# Patient Record
Sex: Male | Born: 1960 | Hispanic: Yes | Marital: Single | Smoking: Former smoker
Health system: Southern US, Community
[De-identification: ages and names within clinical notes are randomized; demographics above are authoritative.]

## PROBLEM LIST (undated history)

## (undated) DIAGNOSIS — J45909 Unspecified asthma, uncomplicated: Secondary | ICD-10-CM

---

## 2020-06-05 ENCOUNTER — Emergency Department (HOSPITAL_COMMUNITY): Payer: Medicare (Managed Care)

## 2020-06-05 ENCOUNTER — Other Ambulatory Visit: Payer: Self-pay

## 2020-06-05 ENCOUNTER — Encounter (HOSPITAL_COMMUNITY): Payer: Self-pay

## 2020-06-05 ENCOUNTER — Inpatient Hospital Stay (HOSPITAL_COMMUNITY)
Admission: EM | Admit: 2020-06-05 | Discharge: 2020-06-09 | DRG: 872 | Disposition: A | Discharge status: Home / Self Care | Payer: Medicare (Managed Care) | Attending: Internal Medicine | Admitting: Internal Medicine

## 2020-06-05 DIAGNOSIS — K3184 Gastroparesis: Secondary | ICD-10-CM | POA: Diagnosis present

## 2020-06-05 DIAGNOSIS — A0472 Enterocolitis due to Clostridium difficile, not specified as recurrent: Secondary | ICD-10-CM | POA: Diagnosis present

## 2020-06-05 DIAGNOSIS — D649 Anemia, unspecified: Secondary | ICD-10-CM | POA: Diagnosis present

## 2020-06-05 DIAGNOSIS — J4520 Mild intermittent asthma, uncomplicated: Secondary | ICD-10-CM

## 2020-06-05 DIAGNOSIS — I10 Essential (primary) hypertension: Secondary | ICD-10-CM | POA: Diagnosis not present

## 2020-06-05 DIAGNOSIS — K760 Fatty (change of) liver, not elsewhere classified: Secondary | ICD-10-CM | POA: Diagnosis present

## 2020-06-05 DIAGNOSIS — F1011 Alcohol abuse, in remission: Secondary | ICD-10-CM

## 2020-06-05 DIAGNOSIS — Z20822 Contact with and (suspected) exposure to covid-19: Secondary | ICD-10-CM | POA: Diagnosis present

## 2020-06-05 DIAGNOSIS — R112 Nausea with vomiting, unspecified: Secondary | ICD-10-CM | POA: Diagnosis present

## 2020-06-05 DIAGNOSIS — R000 Tachycardia, unspecified: Secondary | ICD-10-CM | POA: Diagnosis present

## 2020-06-05 DIAGNOSIS — K2100 Gastro-esophageal reflux disease with esophagitis, without bleeding: Secondary | ICD-10-CM | POA: Diagnosis present

## 2020-06-05 DIAGNOSIS — Z87891 Personal history of nicotine dependence: Secondary | ICD-10-CM

## 2020-06-05 DIAGNOSIS — A414 Sepsis due to anaerobes: Secondary | ICD-10-CM | POA: Diagnosis not present

## 2020-06-05 DIAGNOSIS — A419 Sepsis, unspecified organism: Secondary | ICD-10-CM | POA: Diagnosis not present

## 2020-06-05 DIAGNOSIS — K2270 Barrett's esophagus without dysplasia: Secondary | ICD-10-CM | POA: Diagnosis present

## 2020-06-05 DIAGNOSIS — R0682 Tachypnea, not elsewhere classified: Secondary | ICD-10-CM

## 2020-06-05 DIAGNOSIS — F1020 Alcohol dependence, uncomplicated: Secondary | ICD-10-CM | POA: Diagnosis present

## 2020-06-05 DIAGNOSIS — R3911 Hesitancy of micturition: Secondary | ICD-10-CM | POA: Diagnosis present

## 2020-06-05 DIAGNOSIS — J9811 Atelectasis: Secondary | ICD-10-CM | POA: Diagnosis present

## 2020-06-05 DIAGNOSIS — R109 Unspecified abdominal pain: Secondary | ICD-10-CM

## 2020-06-05 DIAGNOSIS — Z79899 Other long term (current) drug therapy: Secondary | ICD-10-CM

## 2020-06-05 DIAGNOSIS — E876 Hypokalemia: Secondary | ICD-10-CM | POA: Diagnosis present

## 2020-06-05 DIAGNOSIS — N390 Urinary tract infection, site not specified: Secondary | ICD-10-CM

## 2020-06-05 DIAGNOSIS — E861 Hypovolemia: Secondary | ICD-10-CM | POA: Diagnosis present

## 2020-06-05 DIAGNOSIS — I119 Hypertensive heart disease without heart failure: Secondary | ICD-10-CM | POA: Diagnosis present

## 2020-06-05 DIAGNOSIS — Z6823 Body mass index (BMI) 23.0-23.9, adult: Secondary | ICD-10-CM

## 2020-06-05 DIAGNOSIS — N179 Acute kidney failure, unspecified: Secondary | ICD-10-CM | POA: Diagnosis present

## 2020-06-05 DIAGNOSIS — R630 Anorexia: Secondary | ICD-10-CM | POA: Diagnosis present

## 2020-06-05 DIAGNOSIS — Z880 Allergy status to penicillin: Secondary | ICD-10-CM

## 2020-06-05 DIAGNOSIS — K5732 Diverticulitis of large intestine without perforation or abscess without bleeding: Secondary | ICD-10-CM | POA: Diagnosis present

## 2020-06-05 HISTORY — DX: Mild intermittent asthma, uncomplicated: J45.20

## 2020-06-05 HISTORY — DX: Alcohol abuse, in remission: F10.11

## 2020-06-05 HISTORY — DX: Essential (primary) hypertension: I10

## 2020-06-05 HISTORY — DX: Gastro-esophageal reflux disease with esophagitis, without bleeding: K21.00

## 2020-06-05 HISTORY — DX: Unspecified asthma, uncomplicated: J45.909

## 2020-06-05 LAB — URINALYSIS, ROUTINE W REFLEX MICROSCOPIC
Bilirubin Urine: NEGATIVE
Glucose, UA: NEGATIVE mg/dL
Ketones, ur: NEGATIVE mg/dL
Nitrite: POSITIVE — AB
Protein, ur: 100 mg/dL — AB
Specific Gravity, Urine: 1.015 (ref 1.005–1.030)
WBC, UA: >50 WBC/hpf — ABNORMAL HIGH (ref 0–5)
pH: 7 (ref 5.0–8.0)

## 2020-06-05 LAB — CBC WITH DIFFERENTIAL/PLATELET
Abs Immature Granulocytes: 0.12 10*3/uL — ABNORMAL HIGH (ref 0.00–0.07)
Basophils Absolute: 0.1 10*3/uL (ref 0.0–0.1)
Basophils Relative: 1 %
Eosinophils Absolute: 0 10*3/uL (ref 0.0–0.5)
Eosinophils Relative: 0 %
HCT: 42 % (ref 39.0–52.0)
Hemoglobin: 13.7 g/dL (ref 13.0–17.0)
Immature Granulocytes: 1 %
Lymphocytes Relative: 12 %
Lymphs Abs: 1.2 10*3/uL (ref 0.7–4.0)
MCH: 30.2 pg (ref 26.0–34.0)
MCHC: 32.6 g/dL (ref 30.0–36.0)
MCV: 92.7 fL (ref 80.0–100.0)
Monocytes Absolute: 1.2 10*3/uL — ABNORMAL HIGH (ref 0.1–1.0)
Monocytes Relative: 12 %
Neutro Abs: 7.6 10*3/uL (ref 1.7–7.7)
Neutrophils Relative %: 74 %
Platelets: 454 10*3/uL — ABNORMAL HIGH (ref 150–400)
RBC: 4.53 MIL/uL (ref 4.22–5.81)
RDW: 14.8 % (ref 11.5–15.5)
WBC: 10.3 10*3/uL (ref 4.0–10.5)
nRBC: 0 % (ref 0.0–0.2)

## 2020-06-05 LAB — COMPREHENSIVE METABOLIC PANEL
ALT: 18 U/L (ref 0–44)
AST: 20 U/L (ref 15–41)
Albumin: 3.2 g/dL — ABNORMAL LOW (ref 3.5–5.0)
Alkaline Phosphatase: 104 U/L (ref 38–126)
Anion gap: 17 — ABNORMAL HIGH (ref 5–15)
BUN: 6 mg/dL (ref 6–20)
CO2: 25 mmol/L (ref 22–32)
Calcium: 8.4 mg/dL — ABNORMAL LOW (ref 8.9–10.3)
Chloride: 96 mmol/L — ABNORMAL LOW (ref 98–111)
Creatinine, Ser: 1.24 mg/dL (ref 0.61–1.24)
GFR, Estimated: >60 mL/min (ref >60)
Glucose, Bld: 114 mg/dL — ABNORMAL HIGH (ref 70–99)
Potassium: 2.7 mmol/L — CL (ref 3.5–5.1)
Sodium: 138 mmol/L (ref 135–145)
Total Bilirubin: 1 mg/dL (ref 0.3–1.2)
Total Protein: 7.7 g/dL (ref 6.5–8.1)

## 2020-06-05 LAB — TROPONIN I (HIGH SENSITIVITY)
Troponin I (High Sensitivity): 9 ng/L (ref <18)
Troponin I (High Sensitivity): 9 ng/L (ref <18)

## 2020-06-05 LAB — HEMOGLOBIN A1C
Hgb A1c MFr Bld: 5.4 % (ref 4.8–5.6)
Mean Plasma Glucose: 108.28 mg/dL

## 2020-06-05 LAB — C-REACTIVE PROTEIN: CRP: 1.4 mg/dL — ABNORMAL HIGH (ref <1.0)

## 2020-06-05 LAB — MAGNESIUM: Magnesium: 1.8 mg/dL (ref 1.7–2.4)

## 2020-06-05 LAB — LACTIC ACID, PLASMA: Lactic Acid, Venous: 1.6 mmol/L (ref 0.5–1.9)

## 2020-06-05 LAB — TSH: TSH: 1.979 u[IU]/mL (ref 0.350–4.500)

## 2020-06-05 IMAGING — DX DG CHEST 2V
2 series · 2 of 2 positions shown · non-contrast
Comparison: No prior.

CLINICAL DATA: Chest pain.

EXAM:
CHEST - 2 VIEW

[chest ap]
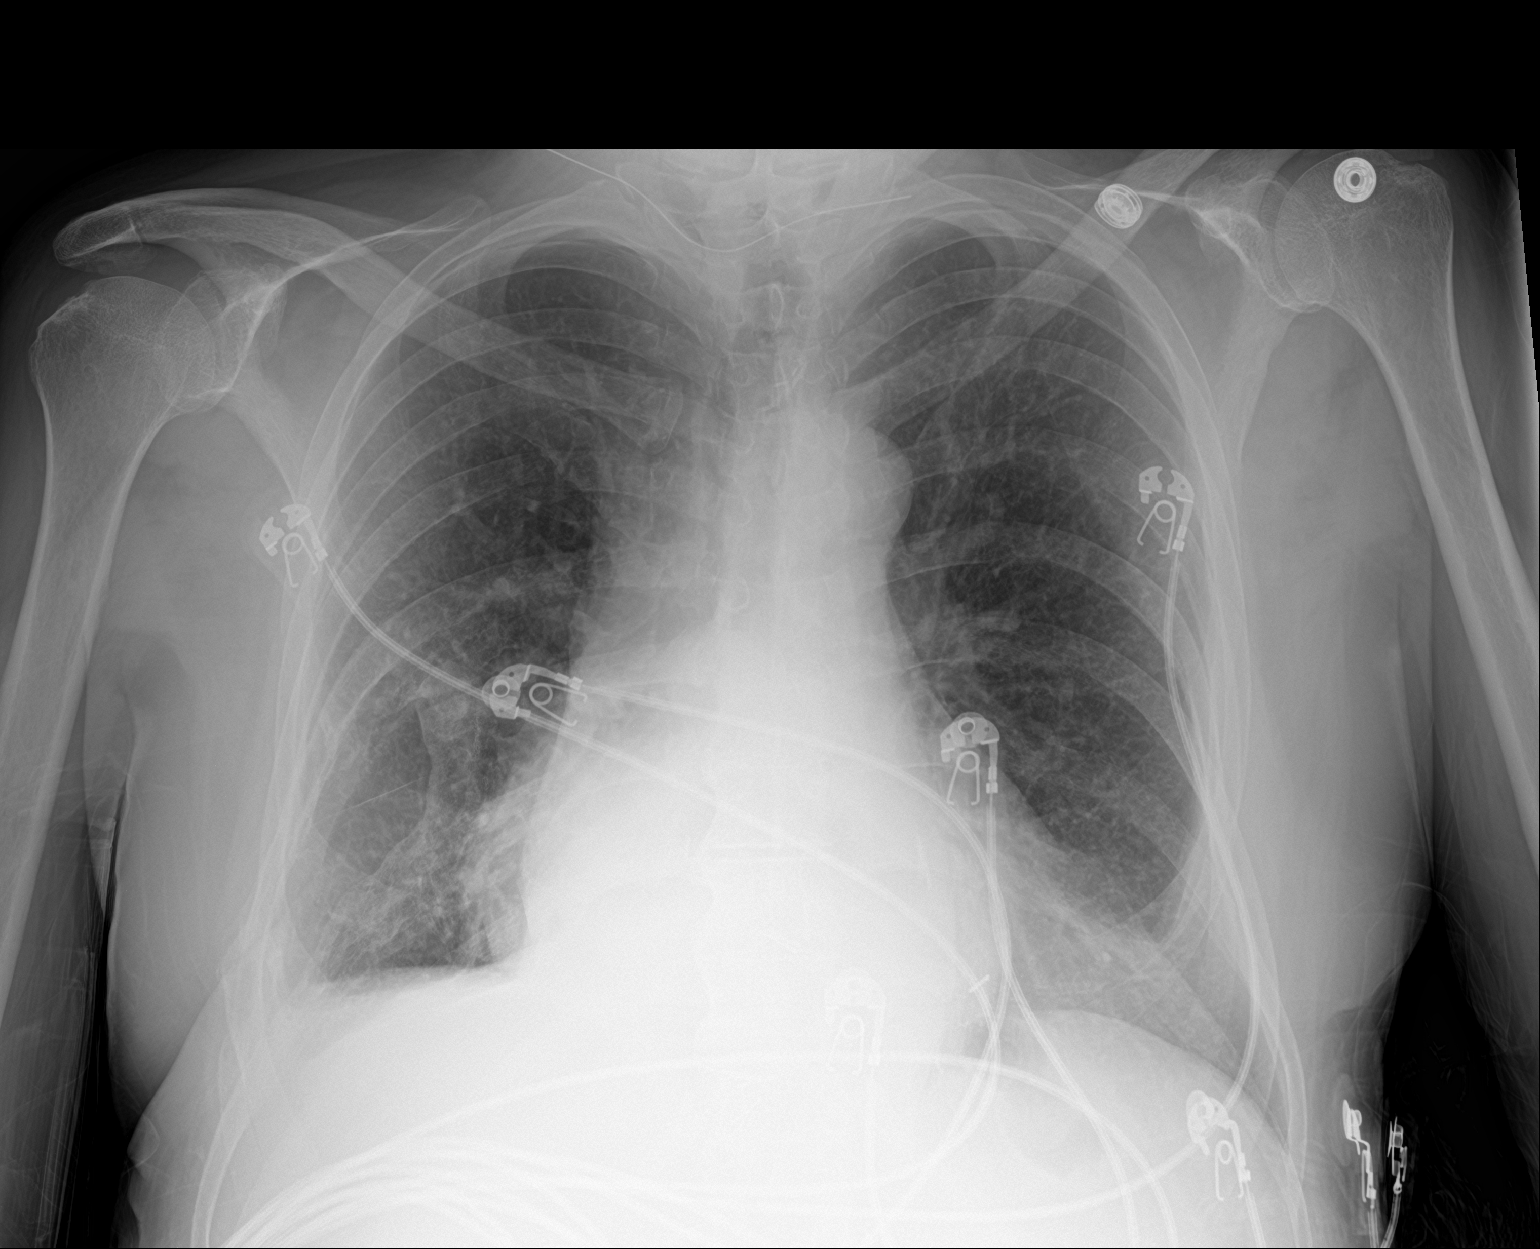

[chest lat]
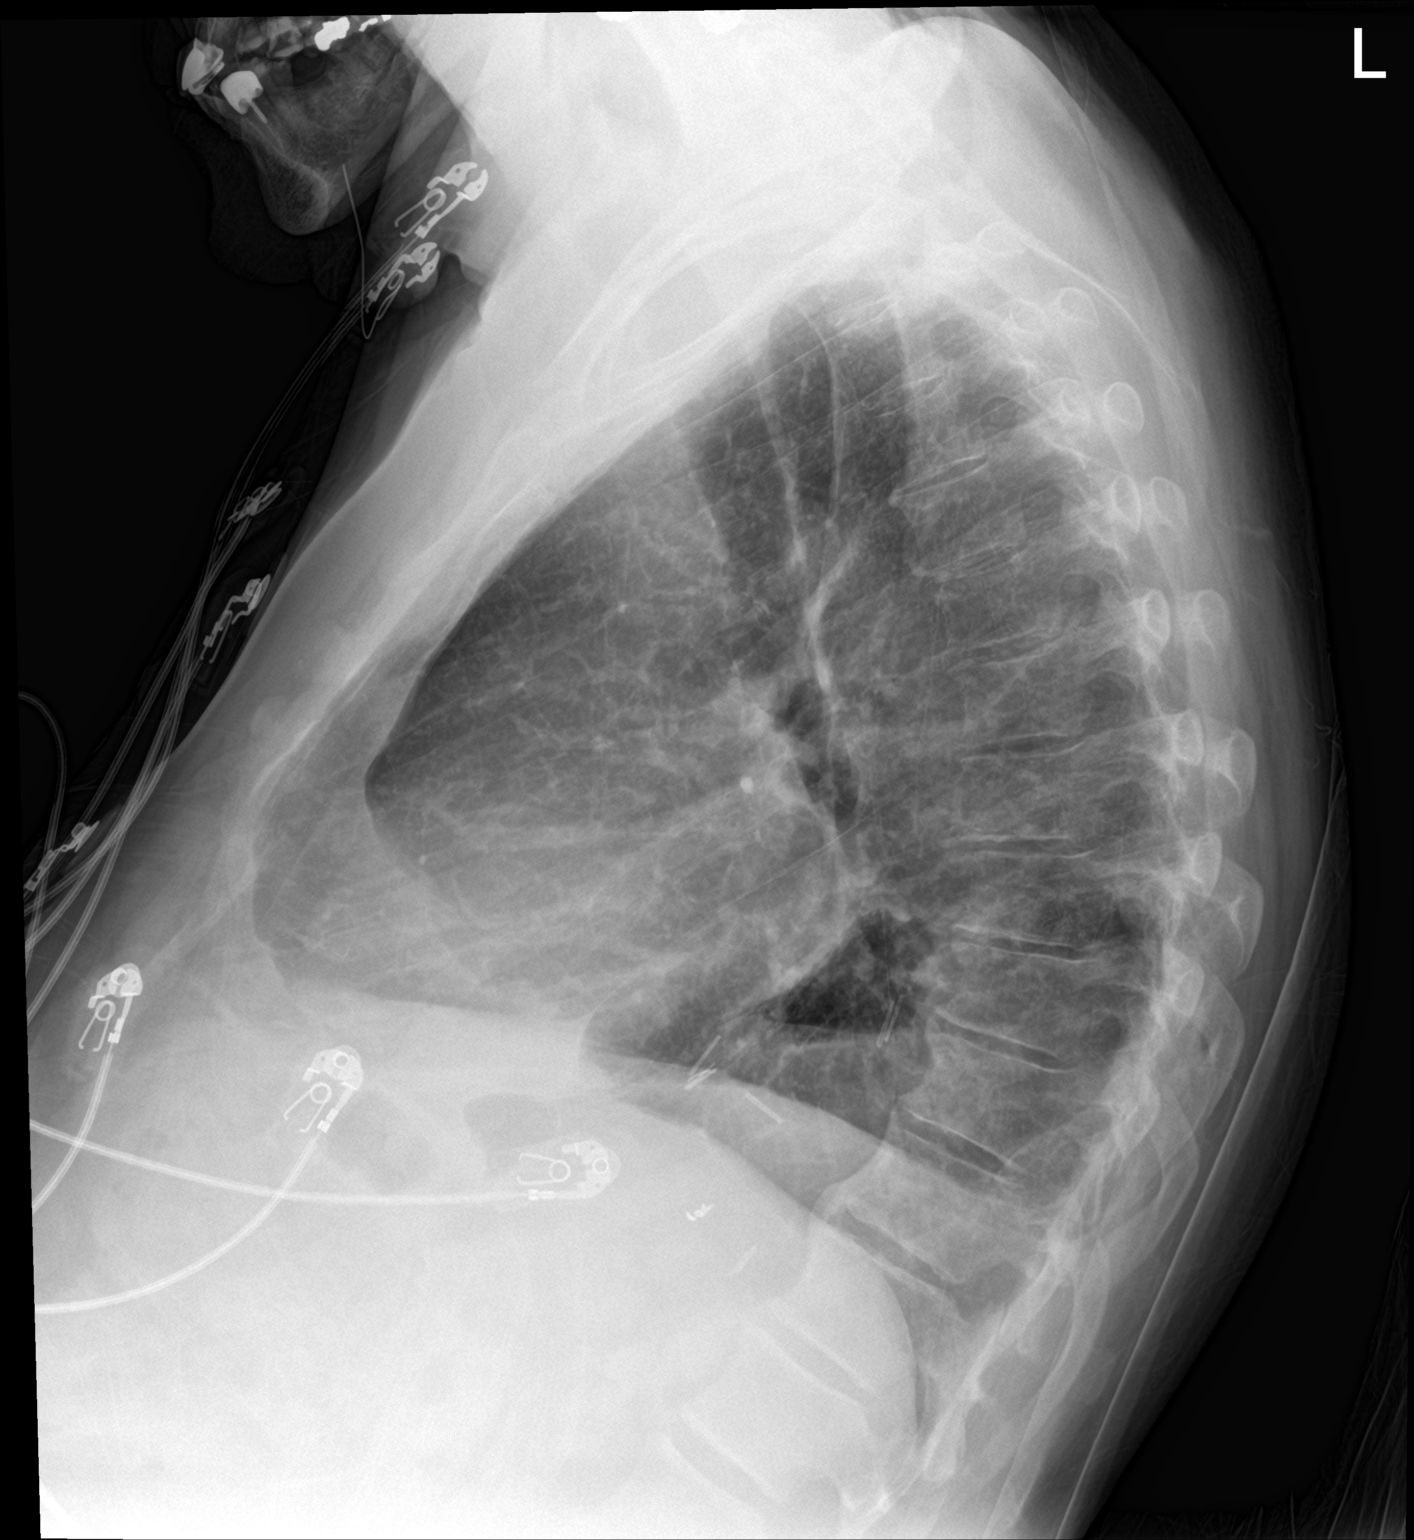

[2 of 2 positions shown; findings below may reference images not displayed]

FINDINGS: Mediastinum and hilar structures normal. Cardiomegaly. No pulmonary
venous congestion. Low lung volumes. Mild right lung base
atelectasis. Mild right base infiltrate cannot be excluded. Pleural
thickening mid right lung base most consistent with scarring. Small
right pleural effusion versus pleural scarring. No pneumothorax.
Deformity noted of the right seventh and possibly eighth ribs. These
may represent old fractures. Degenerative change thoracic spine.
Surgical clips lower chest. Hiatal hernia again noted.
IMPRESSION: 1.  Cardiomegaly.  No pulmonary venous congestion.

2. Mild right base atelectasis. Mild right base infiltrate cannot be
excluded. Mid right lung base pleural thickening consistent with
scarring. Tiny right pleural effusion and or pleural scarring.

3. Deformity noted the right seventh and possibly eighth ribs. These
may represent old fractures. No pneumothorax.

4.  Hiatal hernia again noted.

## 2020-06-05 IMAGING — CT CT CHEST-ABD-PELV W/ CM
3 of 5 series · 14 of 36 positions shown, 16 images · IV contrast (Omni 300)
Comparison: None.

CLINICAL DATA: Recent bowel surgery. Chest pain, shortness of
breath.

EXAM:
CT CHEST, ABDOMEN, AND PELVIS WITH CONTRAST
TECHNIQUE: Multidetector CT imaging of the chest, abdomen and pelvis was
performed following the standard protocol during bolus
administration of intravenous contrast.
CONTRAST:  100mL OMNIPAQUE IOHEXOL 300 MG/ML  SOLN

[Series 3: cap with 5mm st · axial · 0.90mm/px · z∈[-426,+49]mm · 9 of 119 slices shown, 11 images]
[im 12/119  mediastinal]
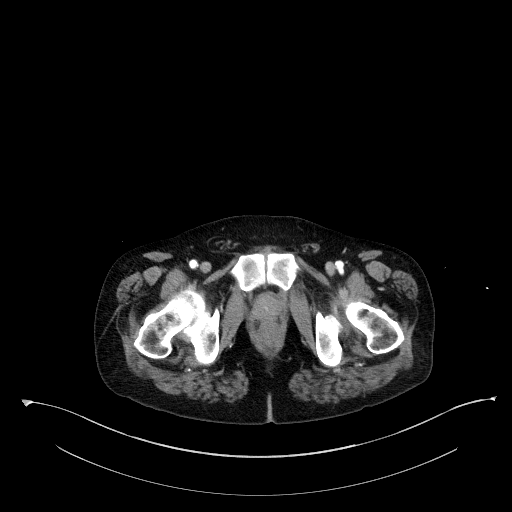
[im 12/119  bone]
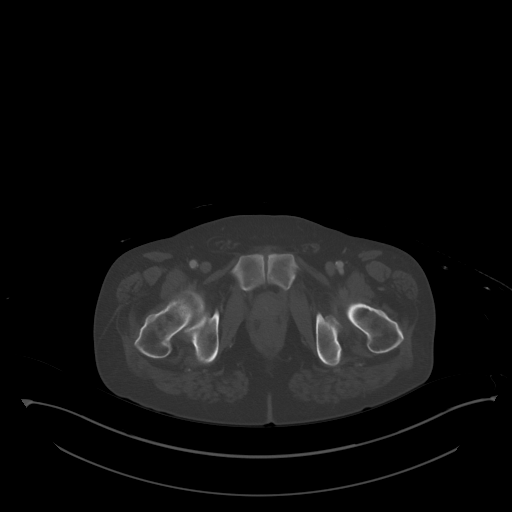
[im 24/119  mediastinal]
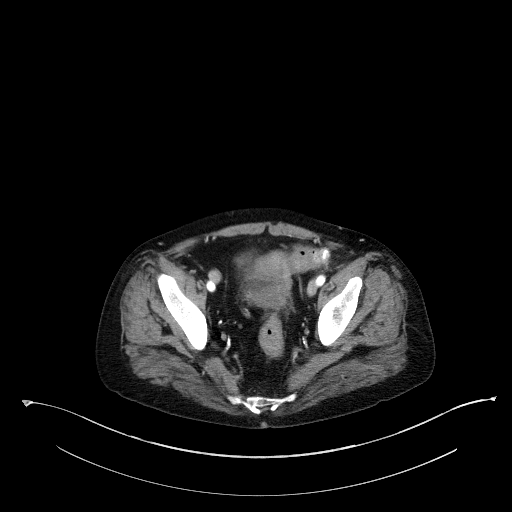
[im 36/119  mediastinal]
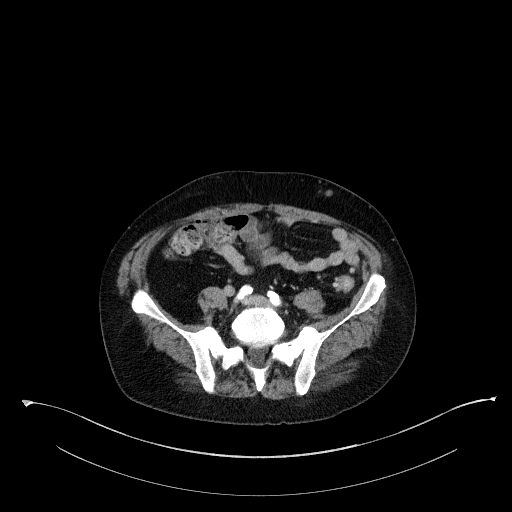
[im 48/119  mediastinal]
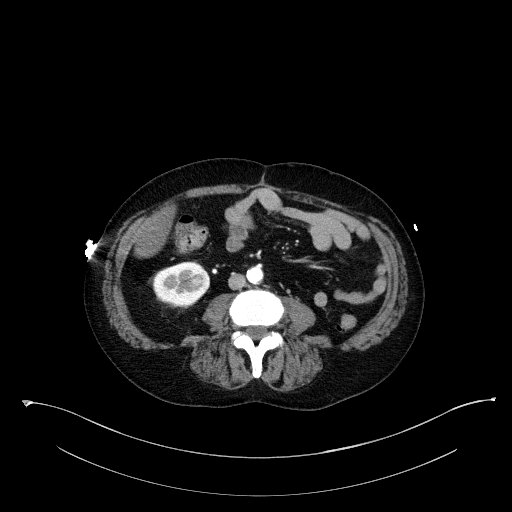
[im 60/119  mediastinal]
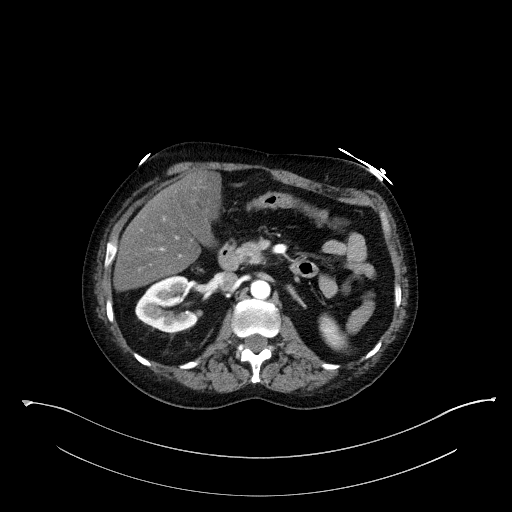
[im 71/119  mediastinal]
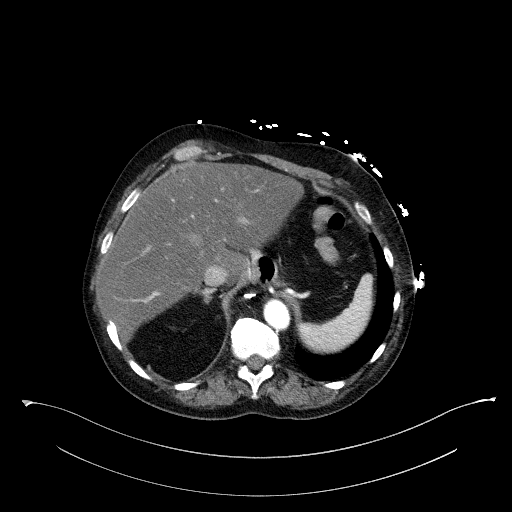
[im 83/119  mediastinal]
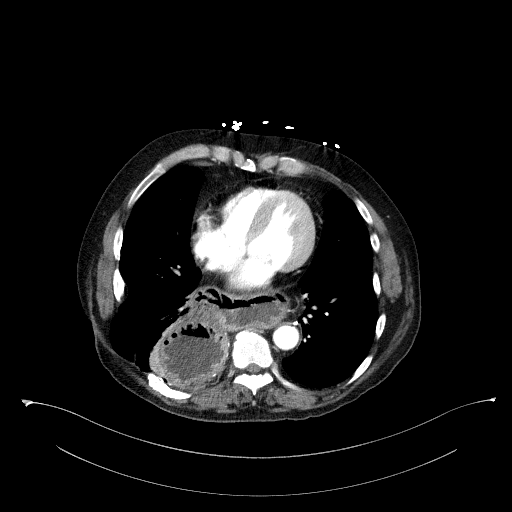
[im 95/119  mediastinal]
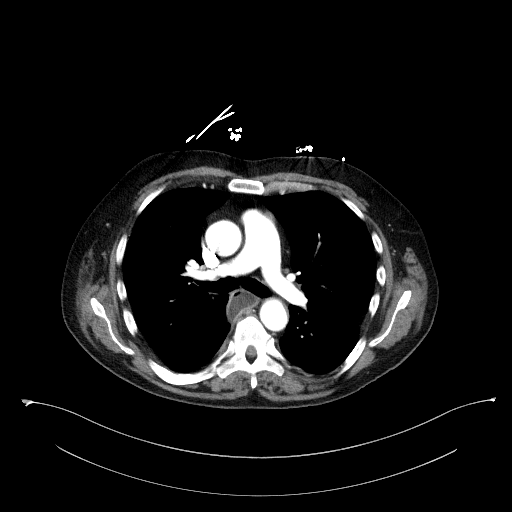
[im 107/119  mediastinal]
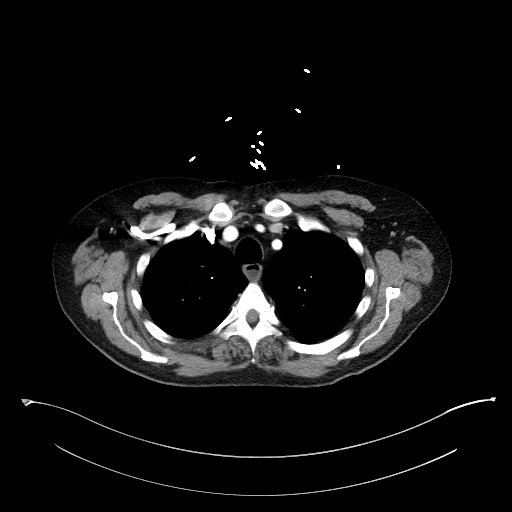
[im 107/119  bone]
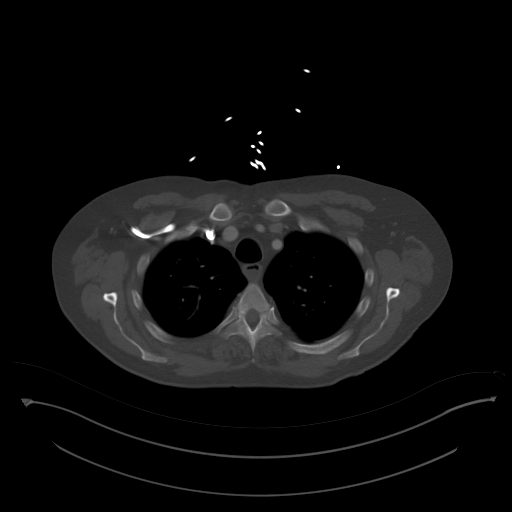

[Series 4: lung · axial · 0.87mm/px · z∈[-157,-113]mm · 2 of 145 slices shown]
[im 12/145  bone]
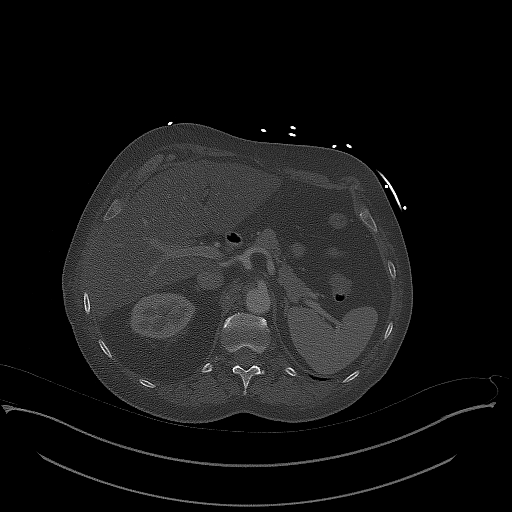
[im 34/145  bone]
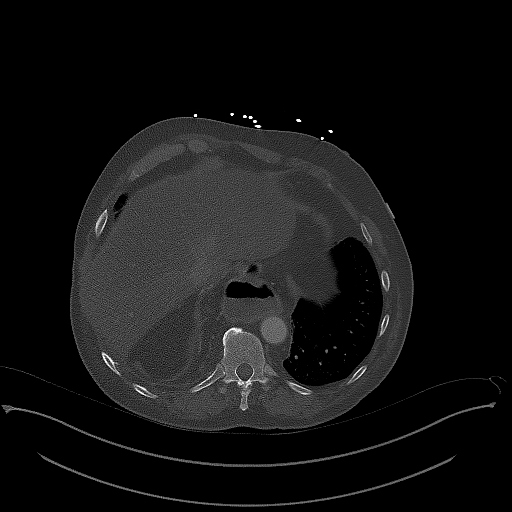

[Series 6: cap with 3mm st cor · coronal · 0.67mm/px · 3 of 130 slices shown]
[im 26/130  mediastinal]
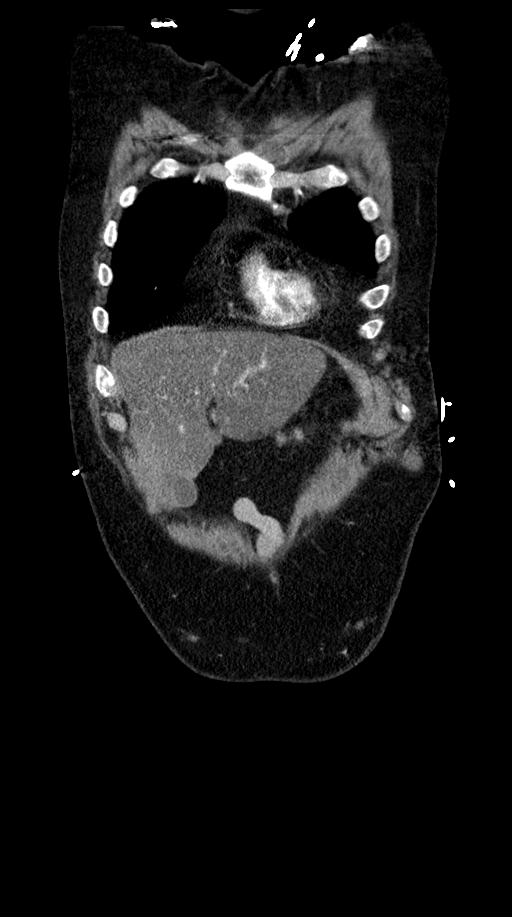
[im 52/130  mediastinal]
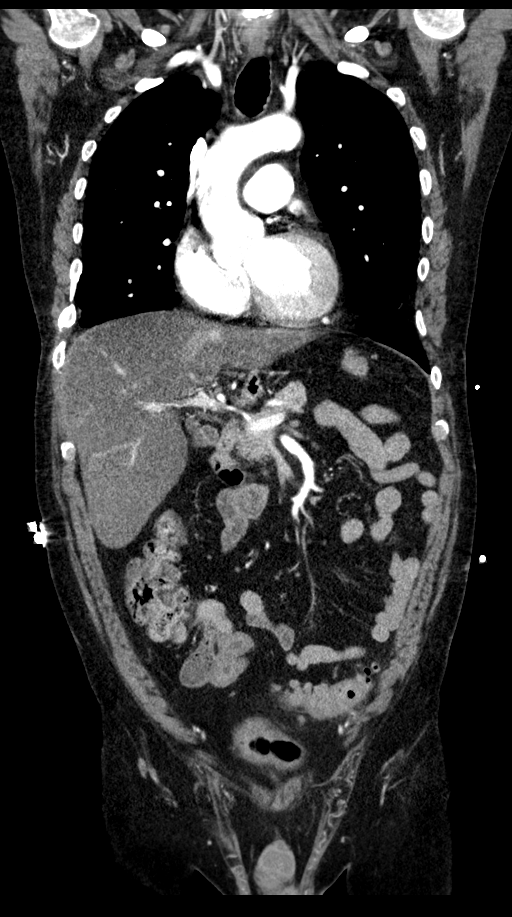
[im 78/130  mediastinal]
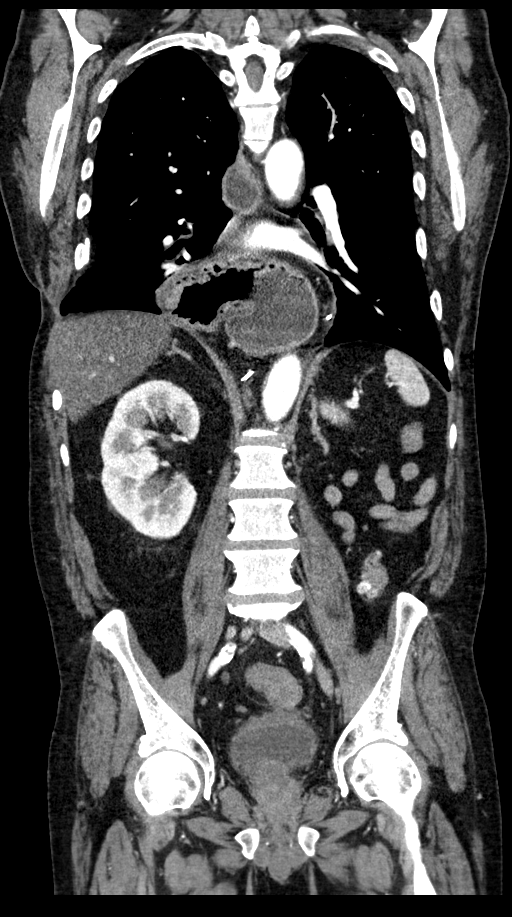

[14 of 36 positions shown; findings below may reference images not displayed]

FINDINGS: CT CHEST FINDINGS

Cardiovascular: Tortuous aorta. Heart is normal size. Aorta is
normal caliber. Scattered aortic and coronary artery calcifications.

Mediastinum/Nodes: No mediastinal, hilar, or axillary adenopathy.
Trachea unremarkable. Prior left thyroidectomy. Changes of gastric
pull-through. Esophagus is fluid-filled as is the gastric
pull-through.

Lungs/Pleura: Atelectasis in the right lower lobe adjacent to the
gastric pull-through. Otherwise no confluent opacities. No
effusions.

Musculoskeletal: Chest wall soft tissues are unremarkable. No acute
bony abnormality. Postoperative changes in the posterior right lower
ribs/chest wall.

CT ABDOMEN PELVIS FINDINGS

Hepatobiliary: Diffuse low-density throughout the liver compatible
with fatty infiltration. No focal abnormality. Gallbladder
unremarkable.

Pancreas: No focal abnormality or ductal dilatation.

Spleen: No focal abnormality.  Normal size.

Adrenals/Urinary Tract: Left kidney is absent, presumed prior
nephrectomy or agenesis. Right kidney unremarkable. No adrenal or
renal mass. No hydronephrosis. Gas seen within the urinary bladder,
presumably from recent catheterization.

Stomach/Bowel: Sigmoid diverticulosis. There is stranding around the
sigmoid colon which could reflect active diverticulitis. Small bowel
decompressed. No bowel obstruction.

Vascular/Lymphatic: Aortic atherosclerosis. No evidence of aneurysm
or adenopathy.

Reproductive: No visible focal abnormality.

Other: No free fluid or free air.

Musculoskeletal: No acute bony abnormality.
IMPRESSION: Postoperative changes in the chest and upper abdomen from what
appears to be gastric pull-through procedure. The stomach and
esophagus are fluid-filled.

Right base atelectasis.

Scattered coronary artery and aortic calcifications.

Hepatic steatosis.

Left colonic diverticulosis. Stranding around the sigmoid colon may
reflect early active diverticulitis.

## 2020-06-05 MED ORDER — LORAZEPAM 2 MG/ML IJ SOLN
1.0000 mg | Freq: Once | INTRAMUSCULAR | Status: AC
  Administered 2020-06-05: 1 mg via INTRAVENOUS
  Filled 2020-06-05: qty 1

## 2020-06-05 MED ORDER — ONDANSETRON HCL 4 MG PO TABS: 4.0000 mg | ORAL_TABLET | Freq: Four times a day (QID) | ORAL | Status: DC | PRN

## 2020-06-05 MED ORDER — SUCRALFATE 1 G PO TABS
1.0000 g | ORAL_TABLET | Freq: Two times a day (BID) | ORAL | Status: DC
  Administered 2020-06-07: 1 g via ORAL
  Filled 2020-06-05 (×2): qty 1

## 2020-06-05 MED ORDER — QUETIAPINE FUMARATE 50 MG PO TABS
50.0000 mg | ORAL_TABLET | Freq: Every day | ORAL | Status: DC
  Administered 2020-06-05 – 2020-06-08 (×4): 50 mg via ORAL
  Filled 2020-06-05 (×4): qty 1

## 2020-06-05 MED ORDER — MORPHINE SULFATE (PF) 2 MG/ML IV SOLN
2.0000 mg | INTRAVENOUS | Status: DC | PRN
Start: 2020-06-05 — End: 2020-06-07
  Administered 2020-06-05 – 2020-06-07 (×6): 2 mg via INTRAVENOUS
  Filled 2020-06-05 (×7): qty 1

## 2020-06-05 MED ORDER — SODIUM CHLORIDE 0.9 % IV SOLN
2.0000 g | Freq: Two times a day (BID) | INTRAVENOUS | Status: DC
  Administered 2020-06-05 – 2020-06-07 (×4): 2 g via INTRAVENOUS
  Filled 2020-06-05 (×5): qty 2

## 2020-06-05 MED ORDER — FLUTICASONE FUROATE-VILANTEROL 100-25 MCG/INH IN AEPB
1.0000 | INHALATION_SPRAY | Freq: Every day | RESPIRATORY_TRACT | Status: DC
  Administered 2020-06-06 – 2020-06-09 (×4): 1 via RESPIRATORY_TRACT
  Filled 2020-06-05: qty 28

## 2020-06-05 MED ORDER — LORAZEPAM 1 MG PO TABS: 1.0000 mg | ORAL_TABLET | ORAL | Status: AC | PRN

## 2020-06-05 MED ORDER — THIAMINE HCL 100 MG PO TABS
100.0000 mg | ORAL_TABLET | Freq: Every day | ORAL | Status: DC
  Administered 2020-06-05 – 2020-06-09 (×4): 100 mg via ORAL
  Filled 2020-06-05 (×5): qty 1

## 2020-06-05 MED ORDER — ACETAMINOPHEN 650 MG RE SUPP: 650.0000 mg | Freq: Four times a day (QID) | RECTAL | Status: DC | PRN

## 2020-06-05 MED ORDER — IOHEXOL 300 MG/ML  SOLN
100.0000 mL | Freq: Once | INTRAMUSCULAR | Status: AC | PRN
  Administered 2020-06-05: 100 mL via INTRAVENOUS

## 2020-06-05 MED ORDER — POTASSIUM CHLORIDE CRYS ER 20 MEQ PO TBCR
40.0000 meq | EXTENDED_RELEASE_TABLET | ORAL | Status: AC
Start: 2020-06-05 — End: 2020-06-06
  Administered 2020-06-05 – 2020-06-06 (×3): 40 meq via ORAL
  Filled 2020-06-05 (×3): qty 2

## 2020-06-05 MED ORDER — PANTOPRAZOLE SODIUM 40 MG IV SOLR
40.0000 mg | Freq: Two times a day (BID) | INTRAVENOUS | Status: DC
  Administered 2020-06-05 – 2020-06-09 (×8): 40 mg via INTRAVENOUS
  Filled 2020-06-05 (×8): qty 40

## 2020-06-05 MED ORDER — PANTOPRAZOLE SODIUM 40 MG IV SOLR
40.0000 mg | Freq: Once | INTRAVENOUS | Status: AC
  Administered 2020-06-05: 40 mg via INTRAVENOUS
  Filled 2020-06-05: qty 40

## 2020-06-05 MED ORDER — SODIUM CHLORIDE 0.9 % IV BOLUS
1000.0000 mL | Freq: Once | INTRAVENOUS | Status: AC
  Administered 2020-06-05: 1000 mL via INTRAVENOUS

## 2020-06-05 MED ORDER — SODIUM CHLORIDE 0.9 % IV SOLN
2.0000 g | Freq: Once | INTRAVENOUS | Status: AC
  Administered 2020-06-05: 2 g via INTRAVENOUS
  Filled 2020-06-05: qty 20

## 2020-06-05 MED ORDER — THIAMINE HCL 100 MG/ML IJ SOLN
100.0000 mg | Freq: Every day | INTRAMUSCULAR | Status: DC
  Administered 2020-06-07: 100 mg via INTRAVENOUS
  Filled 2020-06-05: qty 2

## 2020-06-05 MED ORDER — LORAZEPAM 2 MG/ML IJ SOLN
1.0000 mg | INTRAMUSCULAR | Status: AC | PRN
Start: 2020-06-05 — End: 2020-06-08

## 2020-06-05 MED ORDER — ADULT MULTIVITAMIN W/MINERALS CH
1.0000 | ORAL_TABLET | Freq: Every day | ORAL | Status: DC
  Administered 2020-06-05 – 2020-06-09 (×5): 1 via ORAL
  Filled 2020-06-05 (×5): qty 1

## 2020-06-05 MED ORDER — METOCLOPRAMIDE HCL 5 MG/ML IJ SOLN
5.0000 mg | Freq: Four times a day (QID) | INTRAMUSCULAR | Status: DC
  Administered 2020-06-05 – 2020-06-09 (×15): 5 mg via INTRAVENOUS
  Filled 2020-06-05 (×15): qty 2

## 2020-06-05 MED ORDER — METOPROLOL TARTRATE 50 MG PO TABS
50.0000 mg | ORAL_TABLET | Freq: Two times a day (BID) | ORAL | Status: DC
  Administered 2020-06-05 – 2020-06-09 (×8): 50 mg via ORAL
  Filled 2020-06-05: qty 1
  Filled 2020-06-05: qty 2
  Filled 2020-06-05 (×2): qty 1
  Filled 2020-06-05: qty 2
  Filled 2020-06-05 (×3): qty 1

## 2020-06-05 MED ORDER — ONDANSETRON HCL 4 MG/2ML IJ SOLN
4.0000 mg | Freq: Four times a day (QID) | INTRAMUSCULAR | Status: DC | PRN
  Administered 2020-06-05 – 2020-06-08 (×3): 4 mg via INTRAVENOUS
  Filled 2020-06-05 (×3): qty 2

## 2020-06-05 MED ORDER — SODIUM CHLORIDE 0.9 % IV SOLN: 2.0000 g | Freq: Once | INTRAVENOUS | Status: DC

## 2020-06-05 MED ORDER — SODIUM CHLORIDE 0.9 % IV SOLN: INTRAVENOUS | Status: DC

## 2020-06-05 MED ORDER — ENOXAPARIN SODIUM 40 MG/0.4ML ~~LOC~~ SOLN
40.0000 mg | SUBCUTANEOUS | Status: DC
  Administered 2020-06-05 – 2020-06-08 (×4): 40 mg via SUBCUTANEOUS
  Filled 2020-06-05 (×4): qty 0.4

## 2020-06-05 MED ORDER — MORPHINE SULFATE (PF) 4 MG/ML IV SOLN
4.0000 mg | Freq: Once | INTRAVENOUS | Status: AC
Start: 2020-06-05 — End: 2020-06-05
  Administered 2020-06-05: 4 mg via INTRAVENOUS
  Filled 2020-06-05: qty 1

## 2020-06-05 MED ORDER — ACETAMINOPHEN 325 MG PO TABS: 650.0000 mg | ORAL_TABLET | Freq: Four times a day (QID) | ORAL | Status: DC | PRN

## 2020-06-05 MED ORDER — SUCRALFATE 1 G PO TABS
1.0000 g | ORAL_TABLET | Freq: Once | ORAL | Status: AC
  Administered 2020-06-05: 1 g via ORAL
  Filled 2020-06-05: qty 1

## 2020-06-05 MED ORDER — POTASSIUM CHLORIDE 10 MEQ/100ML IV SOLN
10.0000 meq | INTRAVENOUS | Status: AC
  Administered 2020-06-05 (×2): 10 meq via INTRAVENOUS
  Filled 2020-06-05 (×2): qty 100

## 2020-06-05 MED ORDER — ALUM & MAG HYDROXIDE-SIMETH 200-200-20 MG/5ML PO SUSP
30.0000 mL | Freq: Once | ORAL | Status: AC
  Administered 2020-06-05: 30 mL via ORAL
  Filled 2020-06-05: qty 30

## 2020-06-05 MED ORDER — FOLIC ACID 1 MG PO TABS
1.0000 mg | ORAL_TABLET | Freq: Every day | ORAL | Status: DC
  Administered 2020-06-05 – 2020-06-09 (×5): 1 mg via ORAL
  Filled 2020-06-05 (×5): qty 1

## 2020-06-05 MED ORDER — STERILE WATER FOR INJECTION IJ SOLN
INTRAMUSCULAR | Status: AC
  Administered 2020-06-05: 10 mL
  Filled 2020-06-05: qty 10

## 2020-06-05 MED ORDER — UMECLIDINIUM BROMIDE 62.5 MCG/INH IN AEPB
1.0000 | INHALATION_SPRAY | Freq: Every day | RESPIRATORY_TRACT | Status: DC
  Administered 2020-06-06 – 2020-06-09 (×4): 1 via RESPIRATORY_TRACT
  Filled 2020-06-05: qty 7

## 2020-06-05 NOTE — ED Notes (Signed)
Patient already  Had rocephin prior to Blood culture ordered

## 2020-06-05 NOTE — ED Notes (Signed)
Lab called with potassium level of 2.7.  Doctor made aware.

## 2020-06-05 NOTE — ED Notes (Signed)
Patient transported to X-ray 

## 2020-06-05 NOTE — ED Notes (Signed)
Help get patient into a gown on the monitor sis ekg shown to Dr Charm Barges patient is resting with call bell in reach

## 2020-06-05 NOTE — Progress Notes (Signed)
Pharmacy Antibiotic Note  Colin Hamilton is a 60 y.o. male admitted on 06/05/2020 with UTI.  Pharmacy has been consulted for Cefepime dosing.   Height: 5\' 4"  (162.6 cm) Weight: 61.2 kg (135 lb) IBW/kg (Calculated) : 59.2  Temp (24hrs), Avg:98.5 F (36.9 C), Min:98.5 F (36.9 C), Max:98.5 F (36.9 C)  Recent Labs  Lab 06/05/20 1322  WBC 10.3  CREATININE 1.24    Estimated Creatinine Clearance: 54.4 mL/min (by C-G formula based on SCr of 1.24 mg/dL).    Allergies  Allergen Reactions  . Penicillins     Antimicrobials this admission: 4/5 Cefepime >>  4/5 Vancomycin >>   Dose adjustments this admission: N/a  Microbiology results: Pending   Plan:  - Cefepime 2g IV q12h - Monitor patients renal function and urine output  - De-escalate ABX when appropriate   Thank you for allowing pharmacy to be a part of this patient's care.  08/05/20 PharmD. BCPS 06/05/2020 9:13 PM

## 2020-06-05 NOTE — ED Provider Notes (Signed)
[facility] EMERGENCY DEPARTMENT Provider Note   CSN: [id number] Arrival date & time: 06/05/20  1311     History Chief Complaint  Patient presents with  . Nausea  . Chest Pain    Colin Hamilton is a 60 y.o. male.  60 y.o male with a PMH of Asthma presents to the ED via EMS with a chief complaint of nausea, vomiting, throat pain and chest pain for the past 2 days.  According to patient he has had multiple episodes of nonbilious, nonbloody emesis for the past 2 days, greater than 10.  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  Also reports pain along the lower aspect of his abdomen, stating that he has had difficulty urinating, feels like he is unable to fully void.  He does report not being sexually active, without any GU complaints.Reports he has been drinking red gatorade, therefore unsure whether blood was present on his emesis. Does report a prior hx of Afib and taking metoprolol, however has not been able to keep any medication down.   Arrived by ems from home c/o N/V, throat pain and chest pain. N/V has been ongoing for past two days and throat and chest pain started this morning.  He also endorses slight shortness of breath which began this morning along with left substernal chest pain of dull quality.  He received Zofran by EMS and has not had any further episodes of emesis.  Denies any prior cardiac history, no family history of MI.  No fever, no diarrhea, other complaints.  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  Denies any history of smoking.  Denies being in any anticoagulation.  The history is provided by the patient.  Chest Pain Pain location:  Substernal area Pain quality: dull  Tearing:     Pain radiates to:  Does not  radiate Pain severity:  Mild Onset quality:  Gradual Duration:  2 days Timing:  Intermittent Relieved by:  Nothing Ineffective treatments:  None tried Associated symptoms: abdominal pain, nausea and vomiting   Associated symptoms: no fever, no headache and no shortness of breath        Past Medical History:  Diagnosis Date  . Asthma     There are no problems to display for this patient.      No family history on file.     Home Medications Prior to Admission medications   Not on File    Allergies    Penicillins  Review of Systems   Review of Systems  Constitutional: Negative for fever.  HENT: Negative for sore throat.   Respiratory: Negative for shortness of breath.   Cardiovascular: Positive for chest pain.  Gastrointestinal: Positive for abdominal pain, nausea and vomiting. Negative for diarrhea.  Genitourinary: Positive for difficulty urinating. Negative for flank pain.  Neurological: Negative for headaches.  All other systems reviewed and are negative.   Physical Exam Updated Vital Signs BP (!) 170/79   Pulse (!) 41   Temp 98.5 F (36.9 C) (Oral)   Resp 17   Ht 5\' 4"  (1.626 m)   Wt 61.2 kg   SpO2 98%   BMI 23.17 kg/m   Physical Exam  ED Results / Procedures / Treatments   Labs (all labs ordered are listed, but  only abnormal results are displayed) Labs Reviewed  CBC WITH DIFFERENTIAL/PLATELET - Abnormal; Notable for the following components:      Result Value   Platelets 454 (*)    Monocytes Absolute 1.2 (*)    Abs Immature Granulocytes 0.12 (*)    All other components within normal limits  COMPREHENSIVE METABOLIC PANEL - Abnormal; Notable for the following components:   Potassium 2.7 (*)    Chloride 96 (*)    Glucose, Bld 114 (*)    Calcium 8.4 (*)    Albumin 3.2 (*)    Anion gap 17 (*)    All other components within normal limits  URINALYSIS, ROUTINE W REFLEX MICROSCOPIC - Abnormal; Notable for the following components:   Color,  Urine AMBER (*)    APPearance CLOUDY (*)    Hgb urine dipstick SMALL (*)    Protein, ur 100 (*)    Nitrite POSITIVE (*)    Leukocytes,Ua MODERATE (*)    WBC, UA >50 (*)    Bacteria, UA MANY (*)    All other components within normal limits  URINE CULTURE  SARS CORONAVIRUS 2 (TAT 6-24 HRS)  TROPONIN I (HIGH SENSITIVITY)  TROPONIN I (HIGH SENSITIVITY)    EKG EKG Interpretation  Date/Time:  Tuesday June 05 2020 14:28:44 EDT Ventricular Rate:  137 PR Interval:  92 QRS Duration: 96 QT Interval:  305 QTC Calculation: 461 R Axis:   81 Text Interpretation: Sinus tachycardia with irregular rate RSR' in V1 or V2, probably normal variant Repol abnrm suggests ischemia, diffuse leads Confirmed by Meridee Score [phone]) on 06/05/2020 2:35:49 PM   Radiology DG Chest 2 View  Result Date: 06/05/2020 CLINICAL DATA:  Chest pain. EXAM: CHEST - 2 VIEW COMPARISON:  No prior. FINDINGS: Mediastinum and hilar structures normal. Cardiomegaly. No pulmonary venous congestion. Low lung volumes. Mild right lung base atelectasis. Mild right base infiltrate cannot be excluded. Pleural thickening mid right lung base most consistent with scarring. Small right pleural effusion versus pleural scarring. No pneumothorax. Deformity noted of the right seventh and possibly eighth ribs. These may represent old fractures. Degenerative change thoracic spine. Surgical clips lower chest. Hiatal hernia again noted. IMPRESSION: 1.  Cardiomegaly.  No pulmonary venous congestion. 2. Mild right base atelectasis. Mild right base infiltrate cannot be excluded. Mid right lung base pleural thickening consistent with scarring. Tiny right pleural effusion and or pleural scarring. 3. Deformity noted the right seventh and possibly eighth ribs. These may represent old fractures. No pneumothorax. 4.  Hiatal hernia again noted. Electronically Signed   By: Maisie Fus  Register   On: 06/05/2020 14:58   CT CHEST ABDOMEN PELVIS W CONTRAST  Result Date:  06/05/2020 CLINICAL DATA:  Recent bowel surgery. Chest pain, shortness of breath. EXAM: CT CHEST, ABDOMEN, AND PELVIS WITH CONTRAST TECHNIQUE: Multidetector CT imaging of the chest, abdomen and pelvis was performed following the standard protocol during bolus administration of intravenous contrast. CONTRAST:  OMNIPAQUE IOHEXOL 300 MG/ML  SOLN COMPARISON:  None. FINDINGS: CT CHEST FINDINGS Cardiovascular: Tortuous aorta. Heart is normal size. Aorta is normal caliber. Scattered aortic and coronary artery calcifications. Mediastinum/Nodes: No mediastinal, hilar, or axillary adenopathy. Trachea unremarkable. Prior left thyroidectomy. Changes of gastric pull-through. Esophagus is fluid-filled as is the gastric pull-through. Lungs/Pleura: Atelectasis in the right lower lobe adjacent to the gastric pull-through. Otherwise no confluent opacities. No effusions. Musculoskeletal: Chest wall soft tissues are unremarkable. No acute bony abnormality. Postoperative changes in the posterior right lower ribs/chest wall. CT ABDOMEN PELVIS FINDINGS Hepatobiliary:  Diffuse low-density throughout the liver compatible with fatty infiltration. No focal abnormality. Gallbladder unremarkable. Pancreas: No focal abnormality or ductal dilatation. Spleen: No focal abnormality.  Normal size. 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 Right kidney unremarkable. No adrenal or renal mass. No hydronephrosis. Gas seen within the urinary bladder, presumably from recent catheterization. Stomach/Bowel: Sigmoid diverticulosis. There is stranding around the sigmoid colon which could reflect active diverticulitis. Small bowel decompressed. No bowel obstruction. Vascular/Lymphatic: Aortic atherosclerosis. No evidence of aneurysm or adenopathy. Reproductive: No visible focal abnormality. Other: No free fluid or free air. Musculoskeletal: No acute bony abnormality. IMPRESSION: Postoperative changes in the chest  and upper abdomen from what appears to be gastric pull-through procedure. The stomach and esophagus are fluid-filled. Right base atelectasis. Scattered coronary artery and aortic calcifications. Hepatic steatosis. Left colonic diverticulosis. Stranding around the sigmoid colon may reflect early active diverticulitis. Electronically Signed   By: Charlett NoseKevin  Dover M.D.   On: 06/05/2020 18:52    Procedures Procedures   Medications Ordered in ED Medications  cefTRIAXone (ROCEPHIN) 2 g in sodium chloride 0.9 % 100 mL IVPB (has no administration in time range)  LORazepam (ATIVAN) injection 1 mg (has no administration in time range)  sodium chloride 0.9 % bolus 1,000 mL (has no administration in time range)  pantoprazole (PROTONIX) injection 40 mg (40 mg Intravenous Given 06/05/20 1505)  sucralfate (CARAFATE) tablet 1 g (1 g Oral Given 06/05/20 1526)  potassium chloride 10 mEq in 100 mL IVPB (0 mEq Intravenous Stopped 06/05/20 1824)  sterile water (preservative free) injection (10 mLs  Given 06/05/20 1514)  morphine 4 MG/ML injection 4 mg (4 mg Intravenous Given 06/05/20 1648)  iohexol (OMNIPAQUE) 300 MG/ML solution 100 mL (100 mLs Intravenous Contrast Given 06/05/20 1816)    ED Course  I have reviewed the triage vital signs and the nursing notes.  Pertinent labs & imaging results that were available during my care of the patient were reviewed by me and considered in my medical decision making (see chart for details).  Clinical Course as of 06/05/20 1856  Tue Jun 05, 2020  [id number] 60 year old [place] complaining of nausea vomiting abdominal and chest pain throat pain for the last 2 days.  Labs showing low potassium.  Getting fluids and repletion, disposition per results of testing. [MB]  1454 Potassium(!!): 2.7 IV potassium has been ordered [JS]  1620 Bacteria, UA(!): MANY [JS]  1620 WBC, UA(!): >50 [JS]  1620 Glori LuisLeukocytes,Ua(!): MODERATE [JS]  1620 Nitrite(!): POSITIVE [JS]     Clinical Course User Index [JS] Claude MangesSoto, Lichelle Viets, PA-C [MB] Terrilee FilesButler, Michael C, MD   MDM Rules/Calculators/A&P   Patient presents to the ED with multiple of the nausea, vomiting, abdominal pain along with chest pain.  Prior history of esophageal rupture along with multiple surgeries.  [place] [facility]  Vitals remarkable for hypertension, heart rate significant for sinus tachycardia.  Does have a prior history of A. fib, is currently on metoprolol but not anticoagulated.  During primary evaluation patient is overall nontoxic, non-ill-appearing, does have emesis bag by the bedside but there is no actively vomiting during our encounter.  He did receive Zofran by EMS and reports no further episodes of vomiting.  X-ray of the chest showed: 1. Cardiomegaly. No pulmonary venous congestion.    2. Mild right base atelectasis. Mild right base infiltrate cannot be  excluded. Mid right lung  base pleural thickening consistent with  scarring. Tiny right pleural effusion and or pleural scarring.    3. Deformity noted the right seventh and possibly eighth ribs. These  may represent old fractures. No pneumothorax.    4. Hiatal hernia again noted.   Interpretation of his lab reveal a CBC without leukocytosis, hemoglobin is within normal limits despite multiple episodes of vomiting.  CMP remarkable for hypokalemia, according to patient this is a recurrent thing for him currently not on any supplemental potassium.  LFTs are within normal limits.  UA remarkable for positive nitrites, moderate leukocytes, greater than 50 white blood cell count and many bacteria will send culture at this time.  Remarkable for urinary tract infection.  He was provided with Protonix, Carafate for symptomatic control.  He was also given morphine to help with his pain.  Patient's heart rate consistent in A. fib, rate with the highest in the 120s, was given  ceftriaxone 2 g.  He is currently having potassium replaced, will also receive antibiotics for UTI.  Continues to voice nausea at this time.  According to prior records from [facility] patient does have a history of alcohol use, blood pressure remarkable for retention along with tachycardia, given 1 of Ativan to help with nausea.  Have discussed case with my attending who is agreeable of admission at this time for further care.  In addition, a CT chest, CT abdomen was also ordered to rule out any esophageal rupture although lower suspicion with this with patient being otherwise hemodynamically stable.  CT CHEST/ABDOMEN:  Postoperative changes in the chest and upper abdomen from what  appears to be gastric pull-through procedure. The stomach and  esophagus are fluid-filled.    Right base atelectasis.    Scattered coronary artery and aortic calcifications.    Hepatic steatosis.    Left colonic diverticulosis. Stranding around the sigmoid colon may  reflect early active diverticulitis.   6:56 PM Hospitalist service was repaged.     Portions of this note were generated with Scientist, clinical (histocompatibility and immunogenetics). Dictation errors may occur despite best attempts at proofreading.  Final Clinical Impression(s) / ED Diagnoses Final diagnoses:  Hypokalemia  Nausea and vomiting, intractability of vomiting not specified, unspecified vomiting type  Abdominal pain    Rx / DC Orders ED Discharge Orders    None       Freddy Jaksch 06/05/20 1905    Terrilee Files, MD 06/05/20 [phone]

## 2020-06-05 NOTE — ED Triage Notes (Signed)
Pt arrived by ems from home c/o N/V, throat pain and chest pain. N/V has been ongoing for past two days and throat and chest pain started this morning.  Pt in Afib  Hx of esophagus rupture and multiple surgeries.

## 2020-06-05 NOTE — ED Notes (Signed)
Patient transported to CT 

## 2020-06-05 NOTE — H&P (Signed)
History and Physical    Colin Hamilton ZOX:[id number] DOB: 1961/02/27 DOA: 06/05/2020  PCP: Pcp, No  Patient coming from: Home   Chief Complaint:  Chief Complaint  Patient presents with  . Nausea  . Chest Pain     HPI:    60 year old male with past medical history of complicated history of esophageal perforation in 2019 status post partial esophagectomy and gastric pull-up, gastroesophageal reflux disease, gastritis and esophagitis with Barrett's esophagus (identified on EGD 01/2020 in [address], [place]), hypertension, asthma, alcohol abuse who presents to [facility] emergency department with complaints of intractable nausea and vomiting.  Note, patient was recently hospitalized at [facility] from 2/14 until 2/18 for a similar presentation of intractable nausea and vomiting initial signs and symptoms concerning for sepsis.  Patient was hospitalized for 4 days and a source of infection could not be identified.  Was eventually ruled out with symptoms mild nausea vomiting eventually resolving.  Patient was evaluated by gastroenterology during the hospitalization but no intervention was taken.  Reference was made to EGD performed in 08/2019 where an esophageal ulcer was identified as well as repeat EGD 01/2020 where there is evidence of Barrett's esophagitis as well as gastritis.  With patient symptoms improving, patient was discharged on 2/18.    Patient is a somewhat poor historian.  Patient explains that he just recently moved down to [facility] with some friends over this past weekend.  Patient states that in the past several days he has begun to develop lower abdominal discomfort.  Initially this was mild in intensity and associated with poor appetite.  That being said, patient also reports drinking some beers this weekend although he is not quantifying how much and not stating exactly what his last drink was.  Patient states that over the course of the weekend he "felt  my pressure building up."  He states that when this happens he knows he is about to have "an episode."  Patient explains that by Sunday he began to have intense nausea with frequent bouts of nausea and vomiting.  Patient states that he has been drinking lots of red Gatorade recently that his vomitus is intermittently red as a result.  Patient complains of associated now severe lower abdominal pain, sharp in quality and nonradiating.  Patient does also complain of some associated intermittent nonbloody diarrhea.   Patient denies dysuria but states that he has been having some difficulty with initiating urination as of late.  Patient denies any sick contact or recent ingestion of undercooked food.  Patient denies any fever but does attest to chills over the past several days.  Due to patient's progressively worsening lower abdominal pain, inability to tolerate oral intake and what sounds to be intractable nausea vomiting patient eventually presented to [facility] emergency department.  Upon evaluation in the emergency department patient was found to be somewhat hypokalemic with a potassium of 2.7.  Patient was also found to have multiple sirs criteria including tachycardia and tachypnea with a substantially abnormal urinalysis suggestive of urinary tract infection.  CT imaging of the chest abdomen pelvis was performed revealing no evidence of recurrent esophageal perforation but identifying evidence of fluid in the esophagus and stomach.  There is also some stranding around the sigmoid colon that is possibly early diverticulitis.  Patient was initiated on intravenous ceftriaxone and given a 1 L fluid bolus of isotonic fluids.  Hospitalist group has now been called to assess the patient for admission to the  hospital.  Review of Systems:   Review of Systems  Constitutional: Positive for chills and malaise/fatigue.  Gastrointestinal: Positive for abdominal pain, diarrhea, nausea and vomiting.   Neurological: Positive for weakness.  All other systems reviewed and are negative.   Past Medical History:  Diagnosis Date  . Asthma   . Asthma in adult, mild intermittent, uncomplicated 06/05/2020  . Essential hypertension 06/05/2020  . GERD with esophagitis 06/05/2020  . History of alcohol abuse 06/05/2020    History reviewed. No pertinent surgical history.   reports that he has quit smoking. He has never used smokeless tobacco. He reports current alcohol use. He reports current drug use. Drug: Marijuana.  Allergies  Allergen Reactions  . Penicillins     Family History  Problem Relation Age of Onset  . Cancer Sister      Prior to Admission medications   Medication Sig Start Date End Date Taking? Authorizing Provider  docusate sodium (COLACE) 100 MG capsule Take 100 mg by mouth 2 (two) times daily.   Yes [provider]  famotidine (PEPCID) 40 MG tablet Take 40 mg by mouth at bedtime as needed for heartburn or indigestion.   Yes [provider]  Fluticasone-Umeclidin-Vilant (TRELEGY ELLIPTA) 100-62.5-25 MCG/INH AEPB Inhale 1 puff into the lungs daily.   Yes [provider]  metoCLOPramide (REGLAN) 5 MG/5ML solution Take by mouth 3 (three) times daily before meals.   Yes [provider]  metoprolol tartrate (LOPRESSOR) 50 MG tablet Take 50 mg by mouth 2 (two) times daily.   Yes [provider]  pantoprazole (PROTONIX) 40 MG tablet Take 40 mg by mouth daily.   Yes [provider]  QUEtiapine (SEROQUEL) 50 MG tablet Take 50 mg by mouth at bedtime.   Yes [provider]  sucralfate (CARAFATE) 1 g tablet Take 1 g by mouth 2 (two) times daily before a meal.   Yes [provider]    Physical Exam: Vitals:   06/05/20 1715 06/05/20 1730 06/05/20 1745 06/05/20 1910  BP: (!) 173/73 (!) 141/66 (!) 170/79   Pulse: (!) 41 (!) 41 (!) 41   Resp: (!) 23 (!) 21 17 (!) 23  Temp:      TempSrc:      SpO2: 99% 97% 98% 98%   Weight:      Height:         Constitutional: Awake alert and oriented x3, patient is in mild distress due to abdominal pain. Skin: no rashes, no lesions, notable poor skin turgor. Eyes: Pupils are equally reactive to light.  No evidence of scleral icterus or conjunctival pallor.  ENMT: Dry mucous membranes noted.  Posterior pharynx clear of any exudate or lesions.   Neck: normal, supple, no masses, no thyromegaly.  No evidence of jugular venous distension.   Respiratory: Diminished breath sounds at the bases with mild bibasilar rales, no evidence of wheezing. Normal respiratory effort. No accessory muscle use.  Cardiovascular: Tachycardic rate with occasionally irregular rhythm, no murmurs / rubs / gallops. No extremity edema. 2+ pedal pulses. No carotid bruits.  Chest:   Nontender without crepitus or deformity.   Back:   Nontender without crepitus or deformity. Abdomen: Generalized abdominal tenderness, worst in the lower abdomen, no evidence of intra-abdominal masses.  Positive bowel sounds noted in all quadrants.   Musculoskeletal: No joint deformity upper and lower extremities. Good ROM, no contractures. Normal muscle tone.  Neurologic: CN 2-12 grossly intact. Sensation intact.  Patient moving all 4 extremities spontaneously.  Patient is following all commands.  Patient is responsive to verbal stimuli.   Psychiatric: Patient exhibits normal mood with appropriate affect.  Patient seems to possess insight as to their current situation.     Labs on Admission: I have personally reviewed following labs and imaging studies -   CBC: Recent Labs  Lab 06/05/20 1322  WBC 10.3  NEUTROABS 7.6  HGB 13.7  HCT 42.0  MCV 92.7  PLT 454*   Basic Metabolic Panel: Recent Labs  Lab 06/05/20 1322  NA 138  K 2.7*  CL 96*  CO2 25  GLUCOSE 114*  BUN 6  CREATININE 1.24  CALCIUM 8.4*   GFR: Estimated Creatinine Clearance: 54.4 mL/min (by C-G formula based on SCr of 1.24 mg/dL). Liver  Function Tests: Recent Labs  Lab 06/05/20 1322  AST 20  ALT 18  ALKPHOS 104  BILITOT 1.0  PROT 7.7  ALBUMIN 3.2*   No results for input(s): LIPASE, AMYLASE in the last 168 hours. No results for input(s): AMMONIA in the last 168 hours. Coagulation Profile: No results for input(s): INR, PROTIME in the last 168 hours. Cardiac Enzymes: No results for input(s): CKTOTAL, CKMB, CKMBINDEX, TROPONINI in the last 168 hours. BNP (last 3 results) No results for input(s): PROBNP in the last 8760 hours. HbA1C: No results for input(s): HGBA1C in the last 72 hours. CBG: No results for input(s): GLUCAP in the last 168 hours. Lipid Profile: No results for input(s): CHOL, HDL, LDLCALC, TRIG, CHOLHDL, LDLDIRECT in the last 72 hours. Thyroid Function Tests: No results for input(s): TSH, T4TOTAL, FREET4, T3FREE, THYROIDAB in the last 72 hours. Anemia Panel: No results for input(s): VITAMINB12, FOLATE, FERRITIN, TIBC, IRON, RETICCTPCT in the last 72 hours. Urine analysis:    Component Value Date/Time   COLORURINE AMBER (A) 06/05/2020 1539   APPEARANCEUR CLOUDY (A) 06/05/2020 1539   LABSPEC 1.015 06/05/2020 1539   PHURINE 7.0 06/05/2020 1539   GLUCOSEU NEGATIVE 06/05/2020 1539   HGBUR SMALL (A) 06/05/2020 1539   BILIRUBINUR NEGATIVE 06/05/2020 1539   KETONESUR NEGATIVE 06/05/2020 1539   PROTEINUR 100 (A) 06/05/2020 1539   NITRITE POSITIVE (A) 06/05/2020 1539   LEUKOCYTESUR MODERATE (A) 06/05/2020 1539    Radiological Exams on Admission - Personally Reviewed: DG Chest 2 View  Result Date: 06/05/2020 CLINICAL DATA:  Chest pain. EXAM: CHEST - 2 VIEW COMPARISON:  No prior. FINDINGS: Mediastinum and hilar structures normal. Cardiomegaly. No pulmonary venous congestion. Low lung volumes. Mild right lung base atelectasis. Mild right base infiltrate cannot be excluded. Pleural thickening mid right lung base most consistent with scarring. Small right pleural effusion versus pleural scarring. No  pneumothorax. Deformity noted of the right seventh and possibly eighth ribs. These may represent old fractures. Degenerative change thoracic spine. Surgical clips lower chest. Hiatal hernia again noted. IMPRESSION: 1.  Cardiomegaly.  No pulmonary venous congestion. 2. Mild right base atelectasis. Mild right base infiltrate cannot be excluded. Mid right lung base pleural thickening consistent with scarring. Tiny right pleural effusion and or pleural scarring. 3. Deformity noted the right seventh and possibly eighth ribs. These may represent old fractures. No pneumothorax. 4.  Hiatal hernia again noted. Electronically Signed   By: Maisie Fushomas  Register   On: 06/05/2020 14:58   CT CHEST ABDOMEN PELVIS W CONTRAST  Result Date: 06/05/2020 CLINICAL DATA:  Recent bowel surgery. Chest pain, shortness of breath. EXAM: CT CHEST, ABDOMEN, AND PELVIS WITH CONTRAST TECHNIQUE: Multidetector CT imaging of the chest, abdomen and pelvis was performed following the standard  protocol during bolus administration of intravenous contrast. CONTRAST:  OMNIPAQUE IOHEXOL 300 MG/ML  SOLN COMPARISON:  None. FINDINGS: CT CHEST FINDINGS Cardiovascular: Tortuous aorta. Heart is normal size. Aorta is normal caliber. Scattered aortic and coronary artery calcifications. Mediastinum/Nodes: No mediastinal, hilar, or axillary adenopathy. Trachea unremarkable. Prior left thyroidectomy. Changes of gastric pull-through. Esophagus is fluid-filled as is the gastric pull-through. Lungs/Pleura: Atelectasis in the right lower lobe adjacent to the gastric pull-through. Otherwise no confluent opacities. No effusions. Musculoskeletal: Chest wall soft tissues are unremarkable. No acute bony abnormality. Postoperative changes in the posterior right lower ribs/chest wall. CT ABDOMEN PELVIS FINDINGS Hepatobiliary: Diffuse low-density throughout the liver compatible with fatty infiltration. No focal abnormality. Gallbladder unremarkable. Pancreas: No focal  abnormality or ductal dilatation. Spleen: No focal abnormality.  Normal size. 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 Right kidney unremarkable. No adrenal or renal mass. No hydronephrosis. Gas seen within the urinary bladder, presumably from recent catheterization. Stomach/Bowel: Sigmoid diverticulosis. There is stranding around the sigmoid colon which could reflect active diverticulitis. Small bowel decompressed. No bowel obstruction. Vascular/Lymphatic: Aortic atherosclerosis. No evidence of aneurysm or adenopathy. Reproductive: No visible focal abnormality. Other: No free fluid or free air. Musculoskeletal: No acute bony abnormality. IMPRESSION: Postoperative changes in the chest and upper abdomen from what appears to be gastric pull-through procedure. The stomach and esophagus are fluid-filled. Right base atelectasis. Scattered coronary artery and aortic calcifications. Hepatic steatosis. Left colonic diverticulosis. Stranding around the sigmoid colon may reflect early active diverticulitis. Electronically Signed   By: Charlett Nose M.D.   On: 06/05/2020 18:52    EKG: Personally reviewed.  Rhythm is sinus tachycardia with heart rate of 137 bpm.  No dynamic ST segment changes appreciated.  Assessment/Plan Principal Problem:   Sepsis secondary to UTI [facility])   Patient presenting with multiple SIRS criteria including tachycardia and tachypnea in the setting of urinalysis suggestive of a urinary tract infection as well as edema to the abdomen pelvis suggestive of possible early diverticulitis.  Clinically, I am most concerned about the possibility of early diverticulitis and more concerned about the possibility of a complicated urinary tract infection resulting in possible early sepsis.  Considering patient's recent hospitalization in February and treatment antibiotics at that time will start with empiric regimen of intravenous cefepime  Hydrating patient  aggressively with intravenous isotonic fluids  Lactic acid level pending  Blood and urine cultures have been ordered  We will de-escalate antibiotic therapy based on results  Monitoring patient closely  Active Problems:   Intractable nausea and vomiting   Patient presenting with several days of intractable nausea and vomiting in the setting of a complicated history of esophageal perforation with resection and gastric pull-up in 2019 as well as frequent hospitalizations for intractable nausea vomiting since with recent EGDs in 08/2019 and 01/2020 performed in [place].  Patient seems to be on scheduled Reglan in the outpatient setting per the discharge medication reconciliation from his February hospitalization.  This will be resumed intravenously  Providing additional intravenous antiemetics as needed  Replacing electrolytes as necessary, patient is currently suffering from substantial hypokalemia  Hydrating patient aggressively with intravenous isotonic fluids  N.p.o. for now, advance as tolerated  No evidence of bowel obstruction or recurrent perforation on CT imaging of the chest abdomen and pelvis performed by the emergency department.    Sigmoid diverticulitis   While patient does have lower abdominal tenderness on exam, patient's presentation does not seem consistent with diverticulitis  We  have currently place patient on intravenous antibiotics for further concerns of sepsis secondary to urinary tract infection  Patient currently n.p.o. and diet will be advanced as tolerated  Supportive care concerning this otherwise.    Hypokalemia due to excessive gastrointestinal loss of potassium   Replacing with both intravenous and oral potassium  Magnesium pending, will replace as necessary  Performing serial chemistries    Sinus tachycardia   Patient reports a history of irregular heart beat.    Review of recent hospitalization in [place] makes no mention of history  of atrial fibrillation or other tachyarrhythmias  This may simply be a combination of volume depletion and rebound tachycardia without taking his metoprolol for several days.  Hydrating patient aggressively for now, replacing electrolytes  Monitoring patient on telemetry    GERD with esophagitis   Considering history of esophagitis and gastritis will place patient on intravenous Protonix 40 mg IV every 12 for now  Also resuming patient's home regimen of Carafate as tolerated    History of alcohol abuse   Patient is not particularly forthcoming about his alcohol use.  He states that he was in alcohol 10 years ago but then also admits to drinking several beers with his friends this past weekend and is not quantifying exactly how many he has drank nor when his last drink was.  Review of last hospitalization in February in [place] states that patient was on CIWA protocol during that hospitalization.  Will place patient on CIWA protocol    Essential hypertension   Resuming home regimen of metoprolol  As needed intravenous antihypertensives for markedly elevated blood pressure.    Asthma in adult, mild intermittent, uncomplicated   Continue home regimen of maintenance inhalers  As needed short acting bronchodilators for shortness breath and wheezing  No clinical evidence of asthma exacerbation at this time   Code Status:  Full code Family Communication: deferred   Status is: Observation  The patient remains OBS appropriate and will d/c before 2 midnights.  Dispo: The patient is from: Home              Anticipated d/c is to: Home              Patient currently is not medically stable to d/c.   Difficult to place patient No        Marinda Elk MD [facility] Pager [phone]  If 7PM-7AM, please contact night-coverage [email or web address] Use universal [place] password for that web site. If you do not have the password, please call the hospital  operator.  06/05/2020, 9:22 PM

## 2020-06-06 ENCOUNTER — Inpatient Hospital Stay (HOSPITAL_COMMUNITY): Payer: Medicare (Managed Care)

## 2020-06-06 ENCOUNTER — Observation Stay (HOSPITAL_COMMUNITY): Payer: Medicare (Managed Care)

## 2020-06-06 DIAGNOSIS — Z20822 Contact with and (suspected) exposure to covid-19: Secondary | ICD-10-CM | POA: Diagnosis present

## 2020-06-06 DIAGNOSIS — R1013 Epigastric pain: Secondary | ICD-10-CM | POA: Diagnosis not present

## 2020-06-06 DIAGNOSIS — E876 Hypokalemia: Secondary | ICD-10-CM | POA: Diagnosis present

## 2020-06-06 DIAGNOSIS — K5732 Diverticulitis of large intestine without perforation or abscess without bleeding: Secondary | ICD-10-CM | POA: Diagnosis present

## 2020-06-06 DIAGNOSIS — A0472 Enterocolitis due to Clostridium difficile, not specified as recurrent: Secondary | ICD-10-CM | POA: Diagnosis present

## 2020-06-06 DIAGNOSIS — N179 Acute kidney failure, unspecified: Secondary | ICD-10-CM | POA: Diagnosis present

## 2020-06-06 DIAGNOSIS — K760 Fatty (change of) liver, not elsewhere classified: Secondary | ICD-10-CM | POA: Diagnosis present

## 2020-06-06 DIAGNOSIS — K2100 Gastro-esophageal reflux disease with esophagitis, without bleeding: Secondary | ICD-10-CM | POA: Diagnosis present

## 2020-06-06 DIAGNOSIS — Z79899 Other long term (current) drug therapy: Secondary | ICD-10-CM | POA: Diagnosis not present

## 2020-06-06 DIAGNOSIS — R112 Nausea with vomiting, unspecified: Secondary | ICD-10-CM | POA: Diagnosis not present

## 2020-06-06 DIAGNOSIS — D649 Anemia, unspecified: Secondary | ICD-10-CM | POA: Diagnosis present

## 2020-06-06 DIAGNOSIS — E861 Hypovolemia: Secondary | ICD-10-CM | POA: Diagnosis present

## 2020-06-06 DIAGNOSIS — Z6823 Body mass index (BMI) 23.0-23.9, adult: Secondary | ICD-10-CM | POA: Diagnosis not present

## 2020-06-06 DIAGNOSIS — J4520 Mild intermittent asthma, uncomplicated: Secondary | ICD-10-CM | POA: Diagnosis present

## 2020-06-06 DIAGNOSIS — R630 Anorexia: Secondary | ICD-10-CM | POA: Diagnosis present

## 2020-06-06 DIAGNOSIS — K2270 Barrett's esophagus without dysplasia: Secondary | ICD-10-CM | POA: Diagnosis present

## 2020-06-06 DIAGNOSIS — J9811 Atelectasis: Secondary | ICD-10-CM | POA: Diagnosis present

## 2020-06-06 DIAGNOSIS — Z87891 Personal history of nicotine dependence: Secondary | ICD-10-CM | POA: Diagnosis not present

## 2020-06-06 DIAGNOSIS — R3911 Hesitancy of micturition: Secondary | ICD-10-CM | POA: Diagnosis present

## 2020-06-06 DIAGNOSIS — N390 Urinary tract infection, site not specified: Secondary | ICD-10-CM | POA: Diagnosis present

## 2020-06-06 DIAGNOSIS — R000 Tachycardia, unspecified: Secondary | ICD-10-CM | POA: Diagnosis present

## 2020-06-06 DIAGNOSIS — A414 Sepsis due to anaerobes: Secondary | ICD-10-CM | POA: Diagnosis present

## 2020-06-06 DIAGNOSIS — I119 Hypertensive heart disease without heart failure: Secondary | ICD-10-CM | POA: Diagnosis present

## 2020-06-06 DIAGNOSIS — F1020 Alcohol dependence, uncomplicated: Secondary | ICD-10-CM | POA: Diagnosis present

## 2020-06-06 DIAGNOSIS — Z880 Allergy status to penicillin: Secondary | ICD-10-CM | POA: Diagnosis not present

## 2020-06-06 DIAGNOSIS — A419 Sepsis, unspecified organism: Secondary | ICD-10-CM | POA: Diagnosis not present

## 2020-06-06 DIAGNOSIS — K3184 Gastroparesis: Secondary | ICD-10-CM | POA: Diagnosis present

## 2020-06-06 LAB — URINE CULTURE

## 2020-06-06 LAB — CBC WITH DIFFERENTIAL/PLATELET
Abs Immature Granulocytes: 0.09 10*3/uL — ABNORMAL HIGH (ref 0.00–0.07)
Basophils Absolute: 0.1 10*3/uL (ref 0.0–0.1)
Basophils Relative: 1 %
Eosinophils Absolute: 0.1 10*3/uL (ref 0.0–0.5)
Eosinophils Relative: 1 %
HCT: 38 % — ABNORMAL LOW (ref 39.0–52.0)
Hemoglobin: 12 g/dL — ABNORMAL LOW (ref 13.0–17.0)
Immature Granulocytes: 1 %
Lymphocytes Relative: 14 %
Lymphs Abs: 1.7 10*3/uL (ref 0.7–4.0)
MCH: 30.2 pg (ref 26.0–34.0)
MCHC: 31.6 g/dL (ref 30.0–36.0)
MCV: 95.5 fL (ref 80.0–100.0)
Monocytes Absolute: 1.4 10*3/uL — ABNORMAL HIGH (ref 0.1–1.0)
Monocytes Relative: 12 %
Neutro Abs: 8.6 10*3/uL — ABNORMAL HIGH (ref 1.7–7.7)
Neutrophils Relative %: 71 %
Platelets: 346 10*3/uL (ref 150–400)
RBC: 3.98 MIL/uL — ABNORMAL LOW (ref 4.22–5.81)
RDW: 14.9 % (ref 11.5–15.5)
WBC: 11.9 10*3/uL — ABNORMAL HIGH (ref 4.0–10.5)
nRBC: 0 % (ref 0.0–0.2)

## 2020-06-06 LAB — BASIC METABOLIC PANEL
Anion gap: 15 (ref 5–15)
BUN: 7 mg/dL (ref 6–20)
CO2: 20 mmol/L — ABNORMAL LOW (ref 22–32)
Calcium: 7.7 mg/dL — ABNORMAL LOW (ref 8.9–10.3)
Chloride: 104 mmol/L (ref 98–111)
Creatinine, Ser: 1.27 mg/dL — ABNORMAL HIGH (ref 0.61–1.24)
GFR, Estimated: >60 mL/min (ref >60)
Glucose, Bld: 80 mg/dL (ref 70–99)
Potassium: 3.5 mmol/L (ref 3.5–5.1)
Sodium: 139 mmol/L (ref 135–145)

## 2020-06-06 LAB — HEPATIC FUNCTION PANEL
ALT: 15 U/L (ref 0–44)
AST: 19 U/L (ref 15–41)
Albumin: 2.9 g/dL — ABNORMAL LOW (ref 3.5–5.0)
Alkaline Phosphatase: 83 U/L (ref 38–126)
Bilirubin, Direct: 0.1 mg/dL (ref 0.0–0.2)
Indirect Bilirubin: 0.9 mg/dL (ref 0.3–0.9)
Total Bilirubin: 1 mg/dL (ref 0.3–1.2)
Total Protein: 6.8 g/dL (ref 6.5–8.1)

## 2020-06-06 LAB — CORTISOL-AM, BLOOD: Cortisol - AM: 8.3 ug/dL (ref 6.7–22.6)

## 2020-06-06 LAB — PROCALCITONIN
Procalcitonin: <0.1 ng/mL
Procalcitonin: <0.1 ng/mL

## 2020-06-06 LAB — PROTIME-INR
INR: 1.2 (ref 0.8–1.2)
Prothrombin Time: 14.3 seconds (ref 11.4–15.2)

## 2020-06-06 LAB — HIV ANTIBODY (ROUTINE TESTING W REFLEX): HIV Screen 4th Generation wRfx: NONREACTIVE

## 2020-06-06 LAB — APTT: aPTT: 29 seconds (ref 24–36)

## 2020-06-06 LAB — SARS CORONAVIRUS 2 (TAT 6-24 HRS): SARS Coronavirus 2: NEGATIVE

## 2020-06-06 IMAGING — DX DG CHEST 1V PORT
1 series · 1 of 1 positions shown · non-contrast
Comparison: [DATE]

CLINICAL DATA: Tachypnea

EXAM:
PORTABLE CHEST 1 VIEW

[chest]
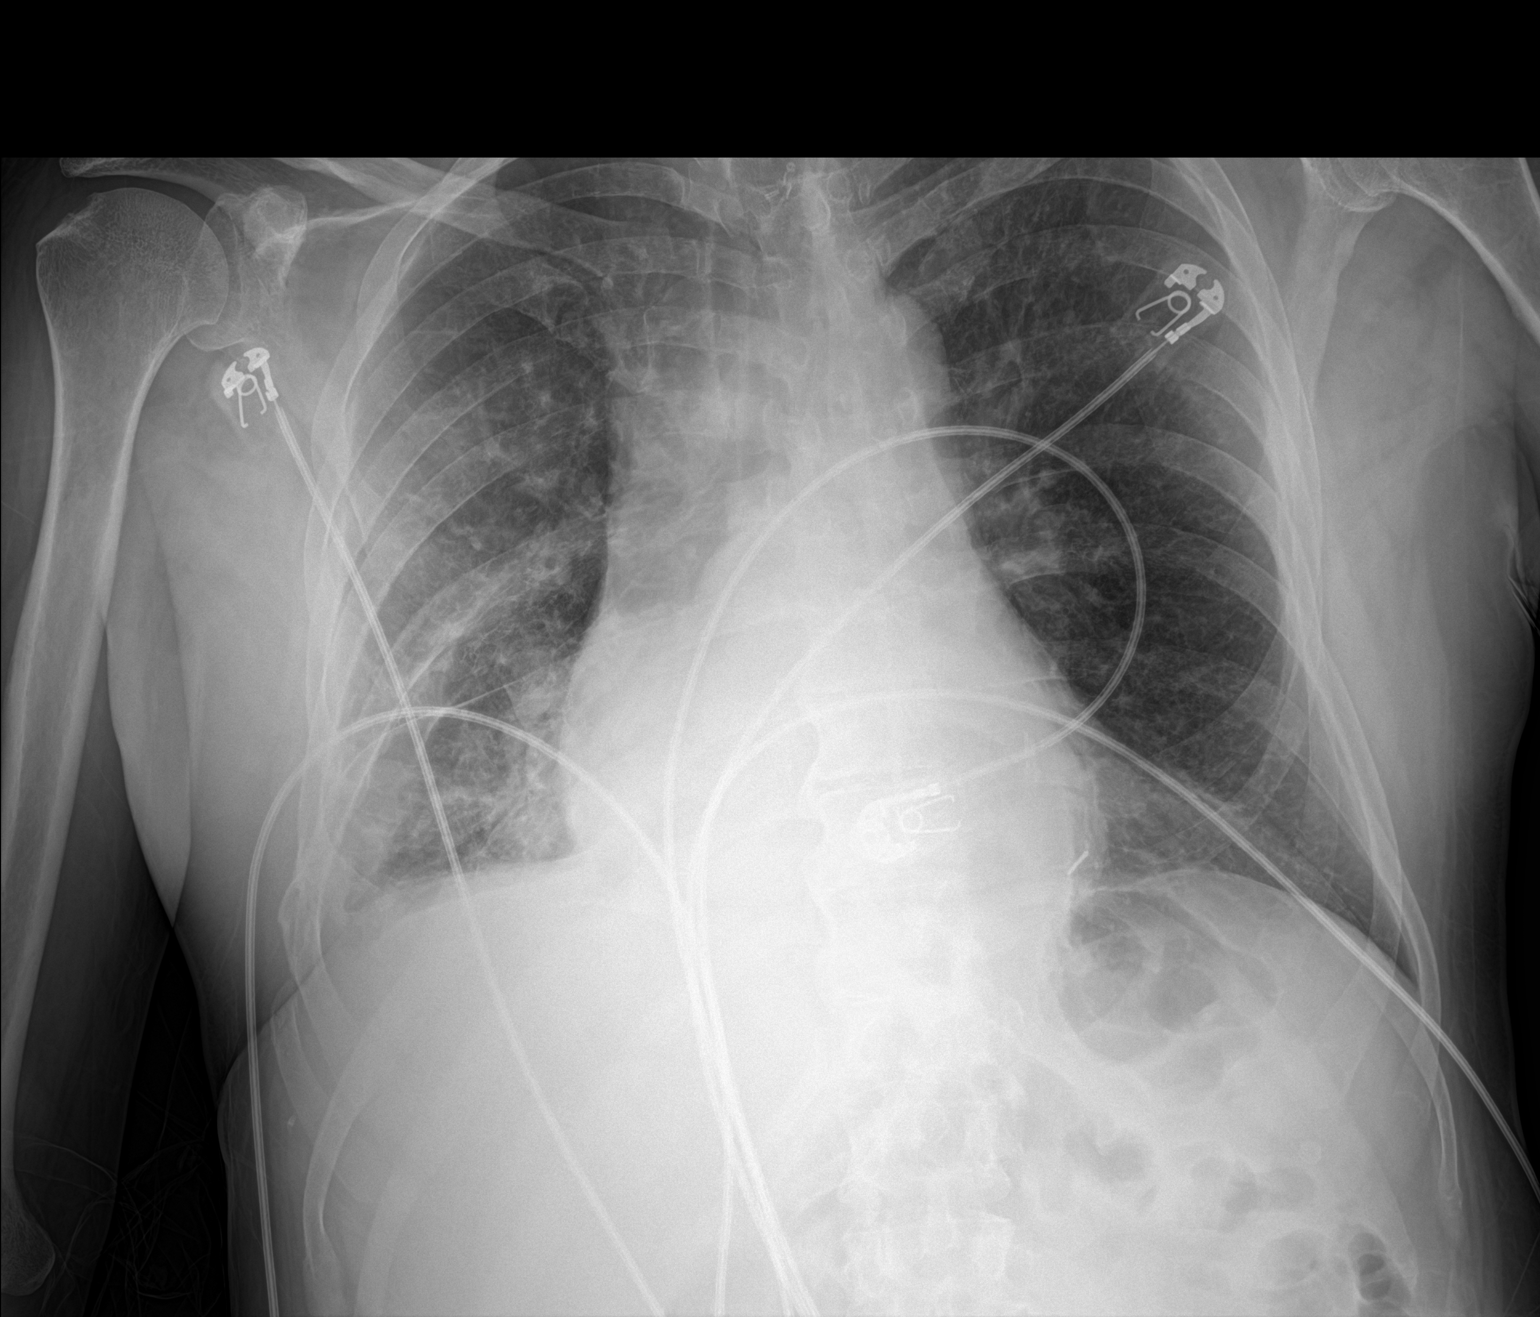

[1 of 1 positions shown; findings below may reference images not displayed]

FINDINGS: Heart size and pulmonary vascularity are normal for technique.
Increased density along the right lower chest with pleural
thickening likely related to postoperative changes from previous
gastric pull-through. Right rib deformities are also likely
postoperative. No developing consolidation or edema in the lungs.
Degenerative changes in the spine and shoulders.
IMPRESSION: Postoperative changes in the right chest. No evidence of active
pulmonary disease.

## 2020-06-06 IMAGING — DX DG ABDOMEN 1V
2 series · 2 of 2 positions shown · non-contrast
Comparison: CT [DATE].

CLINICAL DATA: Nausea and vomiting.

EXAM:
ABDOMEN - 1 VIEW

[abdomen kub (1 of 2)]
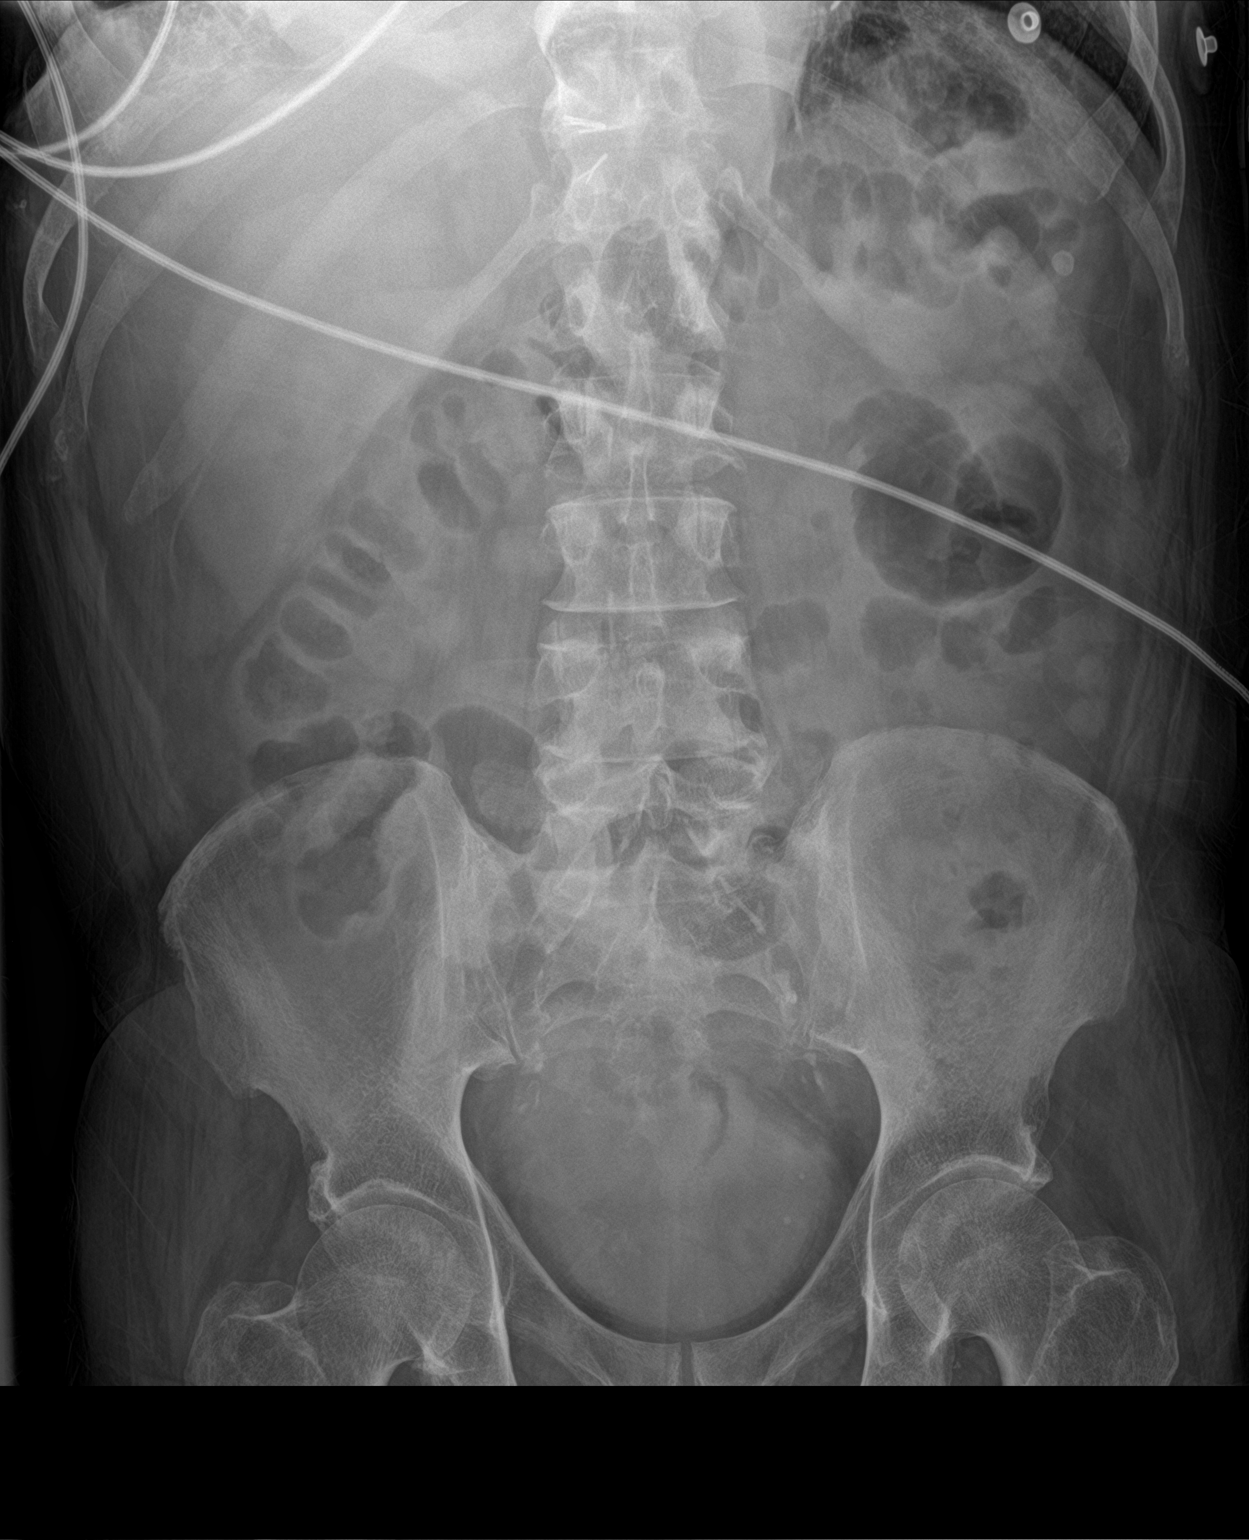

[abdomen kub (2 of 2)]
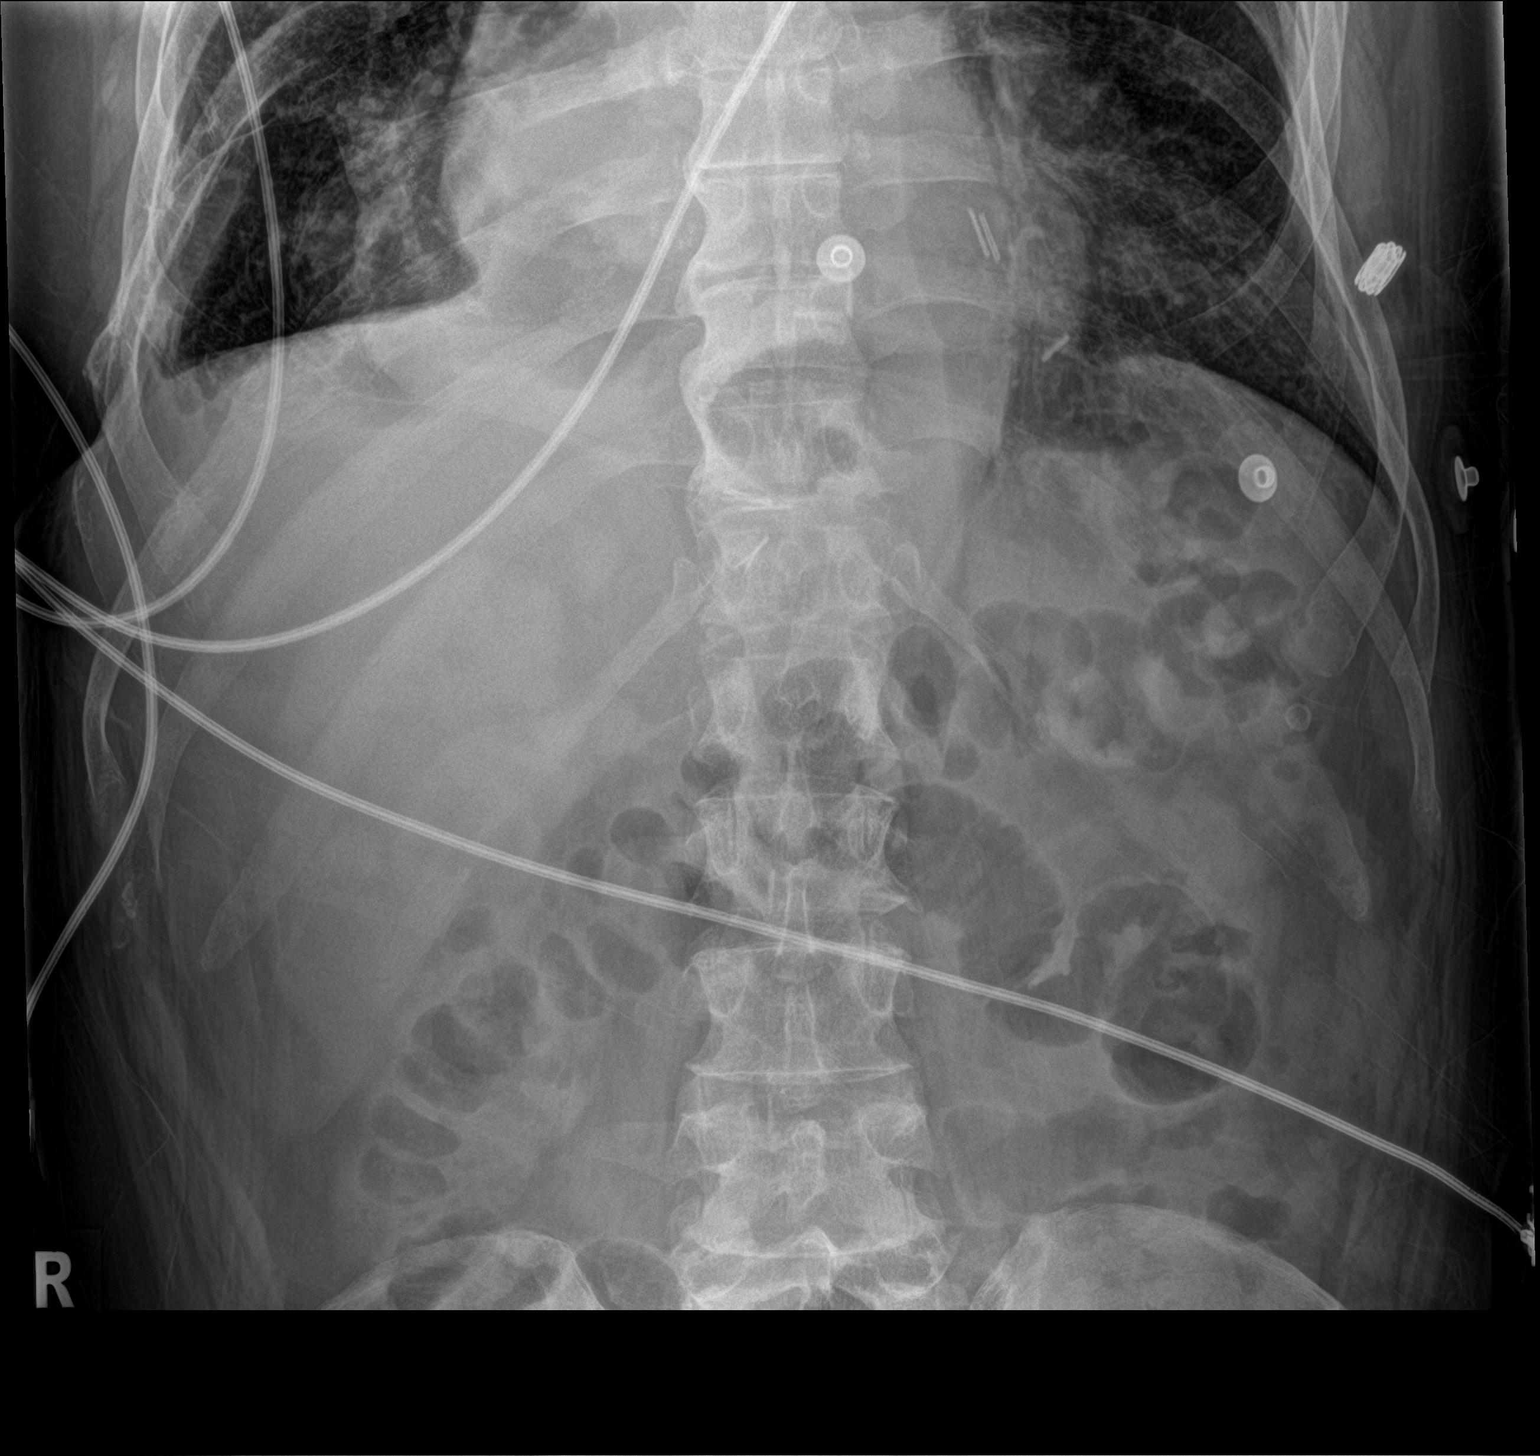

[2 of 2 positions shown; findings below may reference images not displayed]

FINDINGS: Surgical clips lower chest/upper abdomen. Right base
pleural-parenchymal thickening most consistent scarring. Adjacent
right lower old rib fracture noted. Single loop of slightly dilated
small bowel noted in the left upper quadrant. This is nonspecific
finding. Follow-up exam should be considered to exclude developing
small bowel distention. Colon is nondistended. No free air.
Aortoiliac atherosclerotic vascular calcification. Pelvic
calcifications consistent phleboliths. Degenerative change
thoracolumbar spine and both hips.
IMPRESSION: Single loop of slightly dilated small-bowel noted in the left upper
quadrant. This is a nonspecific finding. Follow-up exam should be
considered to exclude developing small bowel distention. Colon is
nondistended. No free air.

## 2020-06-06 MED ORDER — SODIUM CHLORIDE 0.9 % IV BOLUS
1000.0000 mL | Freq: Once | INTRAVENOUS | Status: AC
  Administered 2020-06-07: 1000 mL via INTRAVENOUS

## 2020-06-06 MED ORDER — ALUM & MAG HYDROXIDE-SIMETH 200-200-20 MG/5ML PO SUSP
15.0000 mL | Freq: Four times a day (QID) | ORAL | Status: AC | PRN
  Administered 2020-06-06 – 2020-06-07 (×3): 15 mL via ORAL
  Filled 2020-06-06 (×3): qty 30

## 2020-06-06 MED ORDER — MAGNESIUM SULFATE 2 GM/50ML IV SOLN
2.0000 g | Freq: Once | INTRAVENOUS | Status: AC
  Administered 2020-06-06: 2 g via INTRAVENOUS
  Filled 2020-06-06: qty 50

## 2020-06-06 MED ORDER — SODIUM CHLORIDE 0.9 % IV SOLN: INTRAVENOUS | Status: DC

## 2020-06-06 MED ORDER — POTASSIUM CHLORIDE 10 MEQ/100ML IV SOLN
10.0000 meq | INTRAVENOUS | Status: AC
  Administered 2020-06-06 (×3): 10 meq via INTRAVENOUS
  Filled 2020-06-06 (×3): qty 100

## 2020-06-06 NOTE — Consult Note (Signed)
[facility] Consult: 9:25 AM 06/06/2020  LOS: 0 days    Referring Provider: Lonia Farber, MD Primary Care Physician:  Pcp, No.  Just moved to [facility] late last week Primary Gastroenterologist: Various MDs at [facility] endoscopy in [place].    Reason for Consultation: Nausea, vomiting.   HPI: Colin Hamilton is a 60 y.o. male.  Patient just really located from [place].  Past medical history asthma.  GERD.  Arthritis.  Sigmoid diverticulitis (10/2016, 08/2017).  Alcohol abuse.  Fatty liver on CT imaging.  Knee arthroscopy.  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  Atrial fibrillation not on anticoagulation.  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 EGD 11/2016 for upper GI bleed requiring transfusion for blood loss anemia.  Showed severely ulcerated mucosa from the GE junction to 28 cm. EGD 01/2017, unable to access report. Colonoscopy 01/2017.  Severe diverticulosis, colovesicular and multiple colocolonic fistula, colitis.  That time he received treatment for C. difficile toxin and C. difficile antigen positive stool.   EGD 06/2017.  For hematemesis.  Grade 4 Pan esophagitis, nodular GEJ, squamocolumnar junction appeared irregular..  Biopsies obtained. EGD 08/2017 for GI bleed, blood loss anemia.  Severe circumferential ulcerative esophagitis, no active bleeding, no visible vessel.  Hiatal hernia.  Coffee-ground material limited stomach views but no active bleeding.  Normal visualized duodenum. EGD 08/2017 for recurrent upper GI bleed.  Grade 3 esophagitis in distal  esophagus.  Irregular appearing square MR columnar junction, not biopsied.  Medium sized HH.  Normal-appearing stomach and duodenal mucosa. 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 Right VATS 10/2017 Tracheostomy 11/2017 Bronchoscopy 12/2017. EGD and fiberoptic bronchoscopy 03/2018 EGD with esophageal dilatation 12/2017. EGD with esophageal dilatation 03/2017 x 2.,  05/2018, 07/2018, 08/2018 x 3 Right VATS, LOA,  esophageal drainage 08/2018 Open esophagectomy, repair hiatal hernia diagnostic lap, gastric pull-through 09/2018 at [facility], debridement of right lateral thoractomy 09/2018 EGD 08/2019 EGD 01/2020.  Barrett's esophagus, esophagitis. Unable to access any of the pathology reports from [place] Patient is required transfusions on multiple occasions between 2018 and 2019.  List of home meds includes Pepcid 40 mg prn a HS, Protonix 40 mg daily, Carafate 1 g bid.  He is compliant with all of these.  +++++++++++++++++++++++++++++++++++++++++++++++++++++++++++++++++ At baseline he has chronic, intermittent nausea vomiting.  Normally this lasts no more than 24 hours and treats with Zofran.  Still drinking brandy.  Drink 1 pint of brandy on Saturday.  On Sunday he had recurrent nausea and vomiting accompanied by pain in the mid/upper abdomen and into the chest which is all typical for him.  When they did not subside over the next few days he came to [facility] hospital yesterday.  Vomiting is not grossly bloody. Patient says the symptoms are a cycle where his blood pressure goes up and he develops nausea and vomiting and then the epigastric/chest pain.  Although he claims he has no hypertension he has and takes metoprolol for A. Fib.   No NSAIDs. Additionally he has burning and hesitancy with urination.  Positive sweats. Because of dysphagia patient sticks to very soft, well cooked foods and supplements his diet with Ensure or other similar nutritional supplements.  Stools tend to be loose  but not excessive.  Not having black or bloody stools.  Hypertensive at 164/101.  Initial heart rate in the 40s now in the low 100s.  Excellent room air saturations. Hgb 12.  MCV 95.  WBCs 11.9. BUN/creatinine 7/1.2.  Potassium 2.7 >> 3.5. LFTs normal though albumin low at 2.9. UA with many bacteria, greater than 50 WBCs, positive nitrites, moderate leukocytes, 0-5 squamous cells. Urine and blood cultures cooking. Two-view chest: Cardiomegaly.  Right base atelectasis cannot exclude infiltrate.  Thickening consistent with scarring at right lung base.  Tiny right pleural effusion and or scarring.  Deformity at right seventh and possibly eighth rib, possibly old fractures.  No PTX.  Hiatal hernia. CT chest/abdomen/pelvis w contrast: Fatty liver.  Absent left kidney, unremarkable right kidney.  Sigmoid diverticulosis with associated stranding, could reflect diverticulitis.  Postoperative changes in chest, upper abdomen from what appears to be gastric pull-through procedure.  Stomach, esophagus filled with fluid.  Right base ATX.  Scattered coronary artery and aortic calcifications. KUB: Single loop of slightly dilated small bowel in the left upper quadrant, nonspecific.  Started on cefepime for UTI.  Protonix 40 mg IV bid.  Metoclopramide 5 mg IV every 6 hours. N.p.o. orders except for ice chips, sips with meds.       Past Medical History:  Diagnosis Date  . Asthma   . Asthma in adult, mild intermittent, uncomplicated 06/05/2020  . Essential hypertension 06/05/2020  . GERD with esophagitis 06/05/2020  . History of alcohol abuse 06/05/2020    History reviewed. No pertinent surgical history.  Prior to Admission medications   Medication Sig Start Date End Date Taking? Authorizing Provider  albuterol (VENTOLIN HFA) 108 (90 Base) MCG/ACT inhaler Inhale 2 puffs into the lungs every 6 (six) hours as needed for wheezing or shortness of breath.   Yes [provider]  docusate sodium (COLACE) 100  MG capsule Take 100 mg by mouth 2 (two) times daily.   Yes [provider]  Ensure (ENSURE) Take 237 mLs by mouth 2 (two) times daily as needed (feeding supplement).   Yes [provider]  famotidine (PEPCID) 40 MG tablet Take 40 mg by mouth at bedtime as needed for heartburn or indigestion.   Yes [provider]  Fluticasone-Umeclidin-Vilant (TRELEGY ELLIPTA) 100-62.5-25 MCG/INH AEPB Inhale 1 puff into the lungs daily.   Yes [provider]  metoprolol tartrate (LOPRESSOR) 50 MG tablet Take 50 mg by mouth 2 (two) times daily.   Yes [provider]  ondansetron (ZOFRAN) 4 MG tablet Take 4 mg by mouth every 8 (eight) hours as needed for nausea or vomiting. 04/20/20  Yes [provider]  pantoprazole (PROTONIX) 40 MG tablet Take 40 mg by mouth daily.   Yes [provider]  QUEtiapine (SEROQUEL) 50 MG tablet Take 50 mg by mouth at bedtime.   Yes [provider]  sucralfate (CARAFATE) 1 g tablet Take 1 g by mouth 2 (two) times daily before a meal.   Yes [provider]    Scheduled Meds: . enoxaparin (LOVENOX) injection  40 mg Subcutaneous Q24H  . fluticasone furoate-vilanterol  1 puff Inhalation Daily  .  folic acid  1 mg Oral Daily  . metoCLOPramide (REGLAN) injection  5 mg Intravenous Q6H  . metoprolol tartrate  50 mg Oral BID  . multivitamin with minerals  1 tablet Oral Daily  . pantoprazole (PROTONIX) IV  40 mg Intravenous Q12H  . QUEtiapine  50 mg Oral QHS  . sucralfate  1 g Oral BID AC  . thiamine  100 mg Oral Daily   Or  . thiamine  100 mg Intravenous Daily  . umeclidinium bromide  1 puff Inhalation Daily   Infusions: . sodium chloride 100 mL/hr at 06/06/20 0852  . ceFEPime (MAXIPIME) IV 2 g (06/06/20 0857)   PRN Meds: acetaminophen **OR** acetaminophen, LORazepam **OR** LORazepam, morphine injection, ondansetron **OR** ondansetron (ZOFRAN) IV   Allergies as of 06/05/2020 - Review Complete 06/05/2020   Allergen Reaction Noted  . Penicillins  06/05/2020    Family History  Problem Relation Age of Onset  . Cancer Sister     Social History   Socioeconomic History  . Marital status: Single    Spouse name: Not on file  . Number of children: Not on file  . Years of education: Not on file  . Highest education level: Not on file  Occupational History  . Not on file  Tobacco Use  . Smoking status: Former Games developermoker  . Smokeless tobacco: Never Used  Substance and Sexual Activity  . Alcohol use: Yes    Comment: not clear how much he drinks  . Drug use: Yes    Types: Marijuana  . Sexual activity: Not on file  Other Topics Concern  . Not on file  Social History Narrative  . Not on file   Social Determinants of Health   Financial Resource Strain: Not on file  Food Insecurity: Not on file  Transportation Needs: Not on file  Physical Activity: Not on file  Stress: Not on file  Social Connections: Not on file  Intimate Partner Violence: Not on file    REVIEW OF SYSTEMS: Constitutional: Generally has fair level of energy. ENT:  No nose bleeds Pulm: Denies shortness of breath or cough. CV:  No palpitations, no LE edema.  Chest pain is a burning quality and it is not exertional. GU:  No hematuria, no frequency GI: See HPI Heme: Denies unusual or excessive bleeding or bruising. Transfusions: See HPI Neuro:  No headaches, no peripheral tingling or numbness Derm:  No itching, no rash or sores.  Endocrine:  No sweats or chills.  No polyuria or dysuria Immunization: Not queried Travel:  None beyond local counties in last few months.    PHYSICAL EXAM: Vital signs in last 24 hours: Vitals:   06/06/20 0716 06/06/20 0815  BP:  (!) 146/74  Pulse:  (!) 36  Resp:  (!) 30  Temp: 98.1 F (36.7 C)   SpO2:  94%   Wt Readings from Last 3 Encounters:  06/05/20 61.2 kg    General: Patient looks better than expected but somewhat chronically ill.  Alert, comfortable. Head: No facial  asymmetry or swelling. Eyes: No scleral icterus.  No conjunctival pallor. Ears: No hearing deficit Nose: No congestion or discharge Mouth: Oral mucosa is moist, pink, clear.  Tongue midline.  Good dental repair. Neck: No JVD, no masses, no thyromegaly. Lungs: Clear bilaterally without labored breathing.  No cough. Heart: RRR.  No MRG.  S1, S2 present. Abdomen: Soft, tender across the upper abdomen bilaterally and in the epigastrium.  No guarding or rebound.  Active bowel sounds.  No succession splash.  No HSM, bruits, hernias..   Rectal: Deferred Musc/Skeltl: No joint redness, swelling or gross deformity. Extremities: No CCE Neurologic: Appropriate.  Alert.  Oriented x3.  Good historian.  Moves all 4 limbs without tremor.  Strength not tested but no gross deficits. Skin: No rash, no sores, no telangiectasia, no suspicious lesions. Nodes: No cervical adenopathy Psych: Calm, cooperative, pleasant.  Intake/Output from previous day: 04/05 0701 - 04/06 0700 In: 3400 [I.V.:1000; IV Piggyback:2400] Out: 1900 [Urine:1900] Intake/Output this shift: Total I/O In: 300 [I.V.:300] Out: -   LAB RESULTS: Recent Labs    06/05/20 1322 06/06/20 0816  WBC 10.3 11.9*  HGB 13.7 12.0*  HCT 42.0 38.0*  PLT 454* 346   BMET Lab Results  Component Value Date   NA 139 06/06/2020   NA 138 06/05/2020   K 3.5 06/06/2020   K 2.7 (LL) 06/05/2020   CL 104 06/06/2020   CL 96 (L) 06/05/2020   CO2 20 (L) 06/06/2020   CO2 25 06/05/2020   GLUCOSE 80 06/06/2020   GLUCOSE 114 (H) 06/05/2020   BUN 7 06/06/2020   BUN 6 06/05/2020   CREATININE 1.27 (H) 06/06/2020   CREATININE 1.24 06/05/2020   CALCIUM 7.7 (L) 06/06/2020   CALCIUM 8.4 (L) 06/05/2020   LFT Recent Labs    06/05/20 1322 06/06/20 0816  PROT 7.7 6.8  ALBUMIN 3.2* 2.9*  AST 20 19  ALT 18 15  ALKPHOS 104 83  BILITOT 1.0 1.0  BILIDIR  --  0.1  IBILI  --  0.9   PT/INR Lab Results  Component Value Date   INR 1.2 06/06/2020    Hepatitis Panel No results for input(s): HEPBSAG, HCVAB, HEPAIGM, HEPBIGM in the last 72 hours. C-Diff No components found for: CDIFF Lipase  No results found for: LIPASE  Drugs of Abuse  No results found for: LABOPIA, COCAINSCRNUR, LABBENZ, AMPHETMU, THCU, LABBARB   RADIOLOGY STUDIES: DG Chest 2 View  Result Date: 06/05/2020 CLINICAL DATA:  Chest pain. EXAM: CHEST - 2 VIEW COMPARISON:  No prior. FINDINGS: Mediastinum and hilar structures normal. Cardiomegaly. No pulmonary venous congestion. Low lung volumes. Mild right lung base atelectasis. Mild right base infiltrate cannot be excluded. Pleural thickening mid right lung base most consistent with scarring. Small right pleural effusion versus pleural scarring. No pneumothorax. Deformity noted of the right seventh and possibly eighth ribs. These may represent old fractures. Degenerative change thoracic spine. Surgical clips lower chest. Hiatal hernia again noted. IMPRESSION: 1.  Cardiomegaly.  No pulmonary venous congestion. 2. Mild right base atelectasis. Mild right base infiltrate cannot be excluded. Mid right lung base pleural thickening consistent with scarring. Tiny right pleural effusion and or pleural scarring. 3. Deformity noted the right seventh and possibly eighth ribs. These may represent old fractures. No pneumothorax. 4.  Hiatal hernia again noted. Electronically Signed   By: Maisie Fus  Register   On: 06/05/2020 14:58   DG Abd 1 View  Result Date: 06/06/2020 CLINICAL DATA:  Nausea and vomiting. EXAM: ABDOMEN - 1 VIEW COMPARISON:  CT 06/05/2020. FINDINGS: Surgical clips lower chest/upper abdomen. Right base pleural-parenchymal thickening most consistent scarring. Adjacent right lower old rib fracture noted. Single loop of slightly dilated small bowel noted in the left upper quadrant. This is nonspecific finding. Follow-up exam should be considered to exclude developing small bowel distention. Colon is nondistended. No free air. Aortoiliac  atherosclerotic vascular calcification. Pelvic calcifications consistent phleboliths. Degenerative change thoracolumbar spine and both hips. IMPRESSION: Single loop of slightly dilated  small-bowel noted in the left upper quadrant. This is a nonspecific finding. Follow-up exam should be considered to exclude developing small bowel distention. Colon is nondistended. No free air. Electronically Signed   By: Maisie Fus  Register   On: 06/06/2020 07:59   CT CHEST ABDOMEN PELVIS W CONTRAST  Result Date: 06/05/2020 CLINICAL DATA:  Recent bowel surgery. Chest pain, shortness of breath. EXAM: CT CHEST, ABDOMEN, AND PELVIS WITH CONTRAST TECHNIQUE: Multidetector CT imaging of the chest, abdomen and pelvis was performed following the standard protocol during bolus administration of intravenous contrast. CONTRAST:  OMNIPAQUE IOHEXOL 300 MG/ML  SOLN COMPARISON:  None. FINDINGS: CT CHEST FINDINGS Cardiovascular: Tortuous aorta. Heart is normal size. Aorta is normal caliber. Scattered aortic and coronary artery calcifications. Mediastinum/Nodes: No mediastinal, hilar, or axillary adenopathy. Trachea unremarkable. Prior left thyroidectomy. Changes of gastric pull-through. Esophagus is fluid-filled as is the gastric pull-through. Lungs/Pleura: Atelectasis in the right lower lobe adjacent to the gastric pull-through. Otherwise no confluent opacities. No effusions. Musculoskeletal: Chest wall soft tissues are unremarkable. No acute bony abnormality. Postoperative changes in the posterior right lower ribs/chest wall. CT ABDOMEN PELVIS FINDINGS Hepatobiliary: Diffuse low-density throughout the liver compatible with fatty infiltration. No focal abnormality. Gallbladder unremarkable. Pancreas: No focal abnormality or ductal dilatation. Spleen: No focal abnormality.  Normal size. 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 Right kidney unremarkable. No adrenal or renal mass. No hydronephrosis.  Gas seen within the urinary bladder, presumably from recent catheterization. Stomach/Bowel: Sigmoid diverticulosis. There is stranding around the sigmoid colon which could reflect active diverticulitis. Small bowel decompressed. No bowel obstruction. Vascular/Lymphatic: Aortic atherosclerosis. No evidence of aneurysm or adenopathy. Reproductive: No visible focal abnormality. Other: No free fluid or free air. Musculoskeletal: No acute bony abnormality. IMPRESSION: Postoperative changes in the chest and upper abdomen from what appears to be gastric pull-through procedure. The stomach and esophagus are fluid-filled. Right base atelectasis. Scattered coronary artery and aortic calcifications. Hepatic steatosis. Left colonic diverticulosis. Stranding around the sigmoid colon may reflect early active diverticulitis. Electronically Signed   By: Charlett Nose M.D.   On: 06/05/2020 18:52     IMPRESSION:   *   Nausea, vomiting in patient with incredibly complicated history of severe esophagitis, Barrett's esophagus, GI bleeding from esophagitis, spontaneous esophageal rupture requiring repair and postop complications of lung infiltrate requiring VATS.  *    UTI.  Cefepime in place.  *   Hypokalemia, corrected.  *    alcoholism.  LFTs normal.  Hepatic steatosis on multiple previous and current imaging studies.  *    sigmoid diverticulitis without complication.  History of same on at least a couple of occasions in the past.  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      PLAN:     *   Agree with Protonix 40 iv bid.  Will restart Carafate bid..  Continue scheduled Reglan.  Treat UTI as doing.  No plans at present for EGD unless nausea vomiting fails to respond to treatments.  Initiate clear liquids and advance to dysphagia 1 diet once nausea, vomiting subside.   Jennye Moccasin  06/06/2020, 9:25 AM Phone [phone]

## 2020-06-06 NOTE — ED Notes (Signed)
Attempted to give reportx1 

## 2020-06-06 NOTE — Progress Notes (Signed)
   06/06/20 2200  Assess: MEWS Score  Temp 98.1 F (36.7 C)  BP 120/69  Pulse Rate (!) 113  ECG Heart Rate (!) 111  Resp (!) 28  SpO2 93 %  Assess: MEWS Score  MEWS Temp 0  MEWS Systolic 0  MEWS Pulse 2  MEWS RR 2  MEWS LOC 0  MEWS Score 4  MEWS Score Color Red  Assess: if the MEWS score is Yellow or Red  Were vital signs taken at a resting state? Yes  Focused Assessment No change from prior assessment  Early Detection of Sepsis Score *See Row Information* Medium  MEWS guidelines implemented *See Row Information* Yes  Take Vital Signs  Increase Vital Sign Frequency  Red: Q 1hr X 4 then Q 4hr X 4, if remains red, continue Q 4hrs  Escalate  MEWS: Escalate Red: discuss with charge nurse/RN and provider, consider discussing with RRT  Notify: Charge Nurse/RN  Name of Charge Nurse/RN Notified Thomes Dinning, RN  Date Charge Nurse/RN Notified 06/06/20  Time Charge Nurse/RN Notified 2200  Notify: Provider  Provider Name/Title Dr. Rachael Darby  Date Provider Notified 06/06/20  Time Provider Notified 2343  Notification Type Page  Notification Reason Change in status  Provider response See new orders  Date of Provider Response 06/06/20  Time of Provider Response 2348  Document  Progress note created (see row info) Yes

## 2020-06-06 NOTE — Progress Notes (Signed)
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 Patient recently moved down to [place] with some friends over this past weekend.  He developed worsening lower abdominal discomfort.  He report poor appetite, he has been drinking some beer over the weekend, he also report diarrhea the day prior to admission.  Difficulty Initiating Urination Lately. Evaluation in the ED: Potassium 2.7, CT chest abdomen pelvis; was negative for esophageal perforation, evidence of fluid in the esophagus and stomach.  Some possibility of early diverticulitis.   Assessment & Plan:   Principal Problem:   Sepsis secondary to UTI [facility]) Active Problems:   Hypokalemia due to excessive gastrointestinal loss of potassium   Sinus tachycardia   GERD with esophagitis   History of alcohol abuse   Essential hypertension   Intractable nausea and vomiting   Asthma in adult, mild intermittent, uncomplicated   Sigmoid diverticulitis  1-Sepsis secondary to UTI or early diverticulitis: -Patient presented with SIRS criteria, tachycardia, tachypnea urinalysis suggestive of UTI, CT abdomen possible early diverticulitis -Continue with IV fluids -Check stool for C. Difficile, patient report diarrhea. -Continue with IV cefepime.  2-intractable nausea and vomiting; -In setting of prior history of esophageal perforation, resection and gastric pull-up in 2019 -Continue with scheduled Reglan -KUB with some small bowel dilation -GI consulted, patient might need endoscopy  3-sigmoid diverticulitis: Continue with IV  antibiotics  4 Hypokalemia: Received IV potassium.  Replete magnesium.  Repeat be met  5-Sinus tachycardia: Prior pressure some history of atrial fibrillation or other tachyarrhythmia in [place] Correct electrolyte.  Follow EKG  6-GERD with esophagitis: Continue with IV Protonix twice daily  7-History of alcohol use: Continue with CIWA protocol 8-Essential hypertension: Continue with home dose metoprolol  9-Asthma: Continue with nebulizer as needed.  94-HWTUU gap metabolic acidosis: Likely related to vomiting, hypovolemia   Estimated body mass index is 23.17 kg/m as calculated from the following:   Height as of this encounter: _0  (1.626 m).   Weight as of this encounter: 61.2 kg.   DVT prophylaxis: Lovenox Code Status: Full code Family Communication: Discussed with patient Disposition Plan:  Status is: Observation  The patient will require care spanning > 2 midnights and should be moved to inpatient because: IV treatments appropriate due to intensity of illness or inability to take PO  Dispo: The patient is from: Home              Anticipated d/c is to: Home              Patient currently is not medically stable to d/c.   Difficult to place patient No        Consultants:   GI  Procedures:   None  Antimicrobials:    Subjective: Still requiring IV Zofran and medications to help with nausea, he reported history of diarrhea, 2-3 episodes of watery  Objective: Vitals:   06/06/20 0430 06/06/20 0630 06/06/20 0700 06/06/20 0716  BP: (!) 145/87 136/79 133/76   Pulse: (!) 101  (!) 35   Resp: (!) 24 (!) 30 (!) 29   Temp:    98.1 F (36.7 C)  TempSrc:    Oral  SpO2:  97% 96% 95%   Weight:      Height:        Intake/Output Summary (Last 24 hours) at 06/06/2020 0730 Last data filed at 06/06/2020 2449 Gross per 24 hour  Intake 3400 ml  Output 1900 ml  Net 1500 ml   Filed Weights   06/05/20 1328  Weight: 61.2 kg    Examination:  General exam: Appears  calm and comfortable  Respiratory system: Clear to auscultation. Respiratory effort normal. Cardiovascular system: S1 & S2 heard, RRR. No JVD, murmurs, rubs, gallops or clicks. No pedal edema. Gastrointestinal system: Abdomen is mild distended, soft and Mild tender. No organomegaly or masses felt.  Central nervous system: Alert and oriented. No focal neurological deficits. Extremities: Symmetric 5 x 5 power.  Data Reviewed: I have personally reviewed following labs and imaging studies  CBC: Recent Labs  Lab 06/05/20 1322  WBC 10.3  NEUTROABS 7.6  HGB 13.7  HCT 42.0  MCV 92.7  PLT 753*   Basic Metabolic Panel: Recent Labs  Lab 06/05/20 1322 06/05/20 2205  NA 138  --   K 2.7*  --   CL 96*  --   CO2 25  --   GLUCOSE 114*  --   BUN 6  --   CREATININE 1.24  --   CALCIUM 8.4*  --   MG  --  1.8   GFR: Estimated Creatinine Clearance: 54.4 mL/min (by C-G formula based on SCr of 1.24 mg/dL). Liver Function Tests: Recent Labs  Lab 06/05/20 1322  AST 20  ALT 18  ALKPHOS 104  BILITOT 1.0  PROT 7.7  ALBUMIN 3.2*   No results for input(s): LIPASE, AMYLASE in the last 168 hours. No results for input(s): AMMONIA in the last 168 hours. Coagulation Profile: No results for input(s): INR, PROTIME in the last 168 hours. Cardiac Enzymes: No results for input(s): CKTOTAL, CKMB, CKMBINDEX, TROPONINI in the last 168 hours. BNP (last 3 results) No results for input(s): PROBNP in the last 8760 hours. HbA1C: Recent Labs    06/05/20 2147  HGBA1C 5.4   CBG: No results for input(s): GLUCAP in the last 168 hours. Lipid Profile: No results for input(s): CHOL, HDL, LDLCALC, TRIG, CHOLHDL, LDLDIRECT in the last 72 hours. Thyroid Function Tests: Recent Labs    06/05/20 2147  TSH 1.979   Anemia Panel: No results for input(s): VITAMINB12, FOLATE, FERRITIN, TIBC, IRON, RETICCTPCT in the last 72 hours. Sepsis Labs: Recent Labs  Lab 06/05/20 2147 06/05/20 2205  PROCALCITON  --   <0.10  LATICACIDVEN 1.6  --     Recent Results (from the past 240 hour(s))  SARS CORONAVIRUS 2 (TAT 6-24 HRS) Nasopharyngeal Nasopharyngeal Swab     Status: None   Collection Time: 06/05/20  7:30 PM   Specimen: Nasopharyngeal Swab  Result Value Ref Range Status   SARS Coronavirus 2 NEGATIVE NEGATIVE Final    Comment: (NOTE) SARS-CoV-2 target nucleic acids are NOT DETECTED.  The SARS-CoV-2 RNA is generally detectable in upper and lower respiratory specimens during the acute phase of infection. Negative results do not preclude SARS-CoV-2 infection, do not rule out co-infections with other pathogens, and should not be used as the sole basis for treatment or other patient management decisions. Negative results must be combined with clinical observations, patient history, and epidemiological information. The expected result is Negative.  Fact Sheet for Patients: SugarRoll.be  Fact Sheet for Healthcare Providers: https://www.woods-mathews.com/  This test is not yet approved or cleared by the [place]  FDA and  has been authorized for detection and/or diagnosis of SARS-CoV-2 by FDA under an Emergency Use Authorization (EUA). This EUA will remain  in effect (meaning this test can be used) for the duration of the COVID-19 declaration under Se ction 564(b)(1) of the Act, 21 U.S.C. section 360bbb-3(b)(1), unless the authorization is terminated or revoked sooner.  Performed at [facility], [place] [address], [place], [place] [address]          Radiology Studies: DG Chest 2 View  Result Date: 06/05/2020 CLINICAL DATA:  Chest pain. EXAM: CHEST - 2 VIEW COMPARISON:  No prior. FINDINGS: Mediastinum and hilar structures normal. Cardiomegaly. No pulmonary venous congestion. Low lung volumes. Mild right lung base atelectasis. Mild right base infiltrate cannot be excluded. Pleural thickening mid right lung base most consistent with scarring.  Small right pleural effusion versus pleural scarring. No pneumothorax. Deformity noted of the right seventh and possibly eighth ribs. These may represent old fractures. Degenerative change thoracic spine. Surgical clips lower chest. Hiatal hernia again noted. IMPRESSION: 1.  Cardiomegaly.  No pulmonary venous congestion. 2. Mild right base atelectasis. Mild right base infiltrate cannot be excluded. Mid right lung base pleural thickening consistent with scarring. Tiny right pleural effusion and or pleural scarring. 3. Deformity noted the right seventh and possibly eighth ribs. These may represent old fractures. No pneumothorax. 4.  Hiatal hernia again noted. Electronically Signed   By: Marcello Moores  Register   On: 06/05/2020 14:58   CT CHEST ABDOMEN PELVIS W CONTRAST  Result Date: 06/05/2020 CLINICAL DATA:  Recent bowel surgery. Chest pain, shortness of breath. EXAM: CT CHEST, ABDOMEN, AND PELVIS WITH CONTRAST TECHNIQUE: Multidetector CT imaging of the chest, abdomen and pelvis was performed following the standard protocol during bolus administration of intravenous contrast. CONTRAST:  17m OMNIPAQUE IOHEXOL 300 MG/ML  SOLN COMPARISON:  None. FINDINGS: CT CHEST FINDINGS Cardiovascular: Tortuous aorta. Heart is normal size. Aorta is normal caliber. Scattered aortic and coronary artery calcifications. Mediastinum/Nodes: No mediastinal, hilar, or axillary adenopathy. Trachea unremarkable. Prior left thyroidectomy. Changes of gastric pull-through. Esophagus is fluid-filled as is the gastric pull-through. Lungs/Pleura: Atelectasis in the right lower lobe adjacent to the gastric pull-through. Otherwise no confluent opacities. No effusions. Musculoskeletal: Chest wall soft tissues are unremarkable. No acute bony abnormality. Postoperative changes in the posterior right lower ribs/chest wall. CT ABDOMEN PELVIS FINDINGS Hepatobiliary: Diffuse low-density throughout the liver compatible with fatty infiltration. No focal  abnormality. Gallbladder unremarkable. Pancreas: No focal abnormality or ductal dilatation. Spleen: No focal abnormality.  Normal size. 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 Right kidney unremarkable. No adrenal or renal mass. No hydronephrosis. Gas seen within the urinary bladder, presumably from recent catheterization. Stomach/Bowel: Sigmoid diverticulosis. There is stranding around the sigmoid colon which could reflect active diverticulitis. Small bowel decompressed. No bowel obstruction. Vascular/Lymphatic: Aortic atherosclerosis. No evidence of aneurysm or adenopathy. Reproductive: No visible focal abnormality. Other: No free fluid or free air. Musculoskeletal: No acute bony abnormality. IMPRESSION: Postoperative changes in the chest and upper abdomen from what appears to be gastric pull-through procedure. The stomach and esophagus are fluid-filled. Right base atelectasis. Scattered coronary artery and aortic calcifications. Hepatic steatosis. Left colonic diverticulosis. Stranding around the sigmoid colon may reflect early active diverticulitis. Electronically Signed   By: KRolm BaptiseM.D.   On: 06/05/2020 18:52        Scheduled Meds: . enoxaparin (LOVENOX) injection  40 mg Subcutaneous Q24H  . fluticasone furoate-vilanterol  1 puff Inhalation  Daily  . folic acid  1 mg Oral Daily  . metoCLOPramide (REGLAN) injection  5 mg Intravenous Q6H  . metoprolol tartrate  50 mg Oral BID  . multivitamin with minerals  1 tablet Oral Daily  . pantoprazole (PROTONIX) IV  40 mg Intravenous Q12H  . QUEtiapine  50 mg Oral QHS  . sucralfate  1 g Oral BID AC  . thiamine  100 mg Oral Daily   Or  . thiamine  100 mg Intravenous Daily  . umeclidinium bromide  1 puff Inhalation Daily   Continuous Infusions: . sodium chloride 125 mL/hr at 06/06/20 0607  . ceFEPime (MAXIPIME) IV Stopped (06/05/20 2312)     LOS: 0 days    Time spent: 35 minutes    Nanna Ertle  A [place] Hedman, MD [facility]   If 7PM-7AM, please contact night-coverage [email or web address]  06/06/2020, 7:30 AM

## 2020-06-07 DIAGNOSIS — R112 Nausea with vomiting, unspecified: Secondary | ICD-10-CM

## 2020-06-07 DIAGNOSIS — R109 Unspecified abdominal pain: Secondary | ICD-10-CM

## 2020-06-07 DIAGNOSIS — N390 Urinary tract infection, site not specified: Secondary | ICD-10-CM | POA: Diagnosis not present

## 2020-06-07 DIAGNOSIS — A419 Sepsis, unspecified organism: Secondary | ICD-10-CM | POA: Diagnosis not present

## 2020-06-07 DIAGNOSIS — R1013 Epigastric pain: Secondary | ICD-10-CM | POA: Diagnosis not present

## 2020-06-07 LAB — BASIC METABOLIC PANEL
Anion gap: 15 (ref 5–15)
BUN: 9 mg/dL (ref 6–20)
CO2: 14 mmol/L — ABNORMAL LOW (ref 22–32)
Calcium: 7.5 mg/dL — ABNORMAL LOW (ref 8.9–10.3)
Chloride: 107 mmol/L (ref 98–111)
Creatinine, Ser: 1.47 mg/dL — ABNORMAL HIGH (ref 0.61–1.24)
GFR, Estimated: 55 mL/min — ABNORMAL LOW (ref >60)
Glucose, Bld: 71 mg/dL (ref 70–99)
Potassium: 4.1 mmol/L (ref 3.5–5.1)
Sodium: 136 mmol/L (ref 135–145)

## 2020-06-07 LAB — CBC
HCT: 32.3 % — ABNORMAL LOW (ref 39.0–52.0)
Hemoglobin: 10.2 g/dL — ABNORMAL LOW (ref 13.0–17.0)
MCH: 30.7 pg (ref 26.0–34.0)
MCHC: 31.6 g/dL (ref 30.0–36.0)
MCV: 97.3 fL (ref 80.0–100.0)
Platelets: 246 10*3/uL (ref 150–400)
RBC: 3.32 MIL/uL — ABNORMAL LOW (ref 4.22–5.81)
RDW: 14.9 % (ref 11.5–15.5)
WBC: 15.2 10*3/uL — ABNORMAL HIGH (ref 4.0–10.5)
nRBC: 0 % (ref 0.0–0.2)

## 2020-06-07 LAB — CLOSTRIDIUM DIFFICILE BY PCR, REFLEXED: Toxigenic C. Difficile by PCR: POSITIVE — AB

## 2020-06-07 LAB — C DIFFICILE QUICK SCREEN W PCR REFLEX
C Diff antigen: POSITIVE — AB
C Diff toxin: NEGATIVE

## 2020-06-07 MED ORDER — SUCRALFATE 1 G PO TABS
1.0000 g | ORAL_TABLET | Freq: Three times a day (TID) | ORAL | Status: DC
  Administered 2020-06-07 – 2020-06-09 (×8): 1 g via ORAL
  Filled 2020-06-07 (×7): qty 1

## 2020-06-07 MED ORDER — LACTATED RINGERS IV SOLN: INTRAVENOUS | Status: DC

## 2020-06-07 MED ORDER — LACTATED RINGERS IV BOLUS
1000.0000 mL | Freq: Once | INTRAVENOUS | Status: AC
  Administered 2020-06-07: 1000 mL via INTRAVENOUS

## 2020-06-07 MED ORDER — VANCOMYCIN 50 MG/ML ORAL SOLUTION
125.0000 mg | Freq: Four times a day (QID) | ORAL | Status: DC
  Administered 2020-06-07 – 2020-06-09 (×6): 125 mg via ORAL
  Filled 2020-06-07 (×9): qty 2.5

## 2020-06-07 MED ORDER — SODIUM BICARBONATE-DEXTROSE 150-5 MEQ/L-% IV SOLN
150.0000 meq | INTRAVENOUS | Status: DC
  Filled 2020-06-07: qty 1000

## 2020-06-07 MED ORDER — SODIUM BICARBONATE 8.4 % IV SOLN: INTRAVENOUS | Status: DC

## 2020-06-07 MED ORDER — KETOROLAC TROMETHAMINE 15 MG/ML IJ SOLN
15.0000 mg | Freq: Once | INTRAMUSCULAR | Status: AC
  Administered 2020-06-07: 15 mg via INTRAVENOUS
  Filled 2020-06-07: qty 1

## 2020-06-07 MED ORDER — ACETAMINOPHEN 10 MG/ML IV SOLN
1000.0000 mg | Freq: Three times a day (TID) | INTRAVENOUS | Status: AC | PRN
  Filled 2020-06-07: qty 100

## 2020-06-07 MED ORDER — SODIUM BICARBONATE 8.4 % IV SOLN
INTRAVENOUS | Status: DC
  Filled 2020-06-07 (×2): qty 1000

## 2020-06-07 MED ORDER — MORPHINE SULFATE (PF) 2 MG/ML IV SOLN
1.0000 mg | INTRAVENOUS | Status: DC | PRN
  Administered 2020-06-07 – 2020-06-09 (×8): 1 mg via INTRAVENOUS
  Filled 2020-06-07 (×8): qty 1

## 2020-06-07 NOTE — Progress Notes (Addendum)
Daily Rounding Note  06/07/2020, 12:53 PM  LOS: 1 day   SUBJECTIVE:   Chief complaint:   Nonbloody emesis, nausea, abdominal pain  Gets temporary relief of heartburn with Mylanta as needed.  Still having discomfort in the left upper quadrant which is chronic.  Used 8 mg of morphine yesterday and 3 mg so far today.  No bowel movements. Advance from n.p.o. to chips, sips with meds as of this morning.  Feels like he could give clear liquids and try  OBJECTIVE:         Vital signs in last 24 hours:    Temp:  [97.5 F (36.4 C)-99.5 F (37.5 C)] 97.6 F (36.4 C) (04/07 0952) Pulse Rate:  [69-113] 70 (04/07 0952) Resp:  [19-36] 21 (04/07 0952) BP: (94-141)/(54-96) 114/65 (04/07 0952) SpO2:  [93 %-100 %] 97 % (04/07 0952) Last BM Date: 06/05/20 Filed Weights   06/05/20 1328  Weight: 61.2 kg   General: Comfortable, alert. Heart: RRR Chest: No labored breathing.  Lungs clear bilaterally Abdomen: Soft.  Minor left upper quadrant tenderness without guarding or rebound.  Active bowel sounds.  No distention Extremities: CCE Neuro/Psych: Alert.  Calm.  Oriented x3.  No gross deficits.  No tremors.  Intake/Output from previous day: 04/06 0701 - 04/07 0700 In: 4524.9 [P.O.:240; I.V.:1741.4; IV Piggyback:2543.5] Out: 250 [Urine:250]  Intake/Output this shift: Total I/O In: 566.5 [I.V.:566.5] Out: 200 [Urine:200]  Lab Results: Recent Labs    06/05/20 1322 06/06/20 0816 06/07/20 0954  WBC 10.3 11.9* 15.2*  HGB 13.7 12.0* 10.2*  HCT 42.0 38.0* 32.3*  PLT 454* 346 246   BMET Recent Labs    06/05/20 1322 06/06/20 0816 06/07/20 0954  NA 138 139 136  K 2.7* 3.5 4.1  CL 96* 104 107  CO2 25 20* 14*  GLUCOSE 114* 80 71  BUN 6 7 9   CREATININE 1.24 1.27* 1.47*  CALCIUM 8.4* 7.7* 7.5*   LFT Recent Labs    06/05/20 1322 06/06/20 0816  PROT 7.7 6.8  ALBUMIN 3.2* 2.9*  AST 20 19  ALT 18 15  ALKPHOS 104 83   BILITOT 1.0 1.0  BILIDIR  --  0.1  IBILI  --  0.9   PT/INR Recent Labs    06/06/20 0816  LABPROT 14.3  INR 1.2    Studies/Results: DG Chest 2 View  Result Date: 06/05/2020 CLINICAL DATA:  Chest pain. EXAM: CHEST - 2 VIEW COMPARISON:  No prior. FINDINGS: Mediastinum and hilar structures normal. Cardiomegaly. No pulmonary venous congestion. Low lung volumes. Mild right lung base atelectasis. Mild right base infiltrate cannot be excluded. Pleural thickening mid right lung base most consistent with scarring. Small right pleural effusion versus pleural scarring. No pneumothorax. Deformity noted of the right seventh and possibly eighth ribs. These may represent old fractures. Degenerative change thoracic spine. Surgical clips lower chest. Hiatal hernia again noted. IMPRESSION: 1.  Cardiomegaly.  No pulmonary venous congestion. 2. Mild right base atelectasis. Mild right base infiltrate cannot be excluded. Mid right lung base pleural thickening consistent with scarring. Tiny right pleural effusion and or pleural scarring. 3. Deformity noted the right seventh and possibly eighth ribs. These may represent old fractures. No pneumothorax. 4.  Hiatal hernia again noted. Electronically Signed   By: 08/05/2020  Register   On: 06/05/2020 14:58   DG Abd 1 View  Result Date: 06/06/2020 CLINICAL DATA:  Nausea and vomiting. EXAM: ABDOMEN - 1 VIEW COMPARISON:  CT 06/05/2020.  FINDINGS: Surgical clips lower chest/upper abdomen. Right base pleural-parenchymal thickening most consistent scarring. Adjacent right lower old rib fracture noted. Single loop of slightly dilated small bowel noted in the left upper quadrant. This is nonspecific finding. Follow-up exam should be considered to exclude developing small bowel distention. Colon is nondistended. No free air. Aortoiliac atherosclerotic vascular calcification. Pelvic calcifications consistent phleboliths. Degenerative change thoracolumbar spine and both hips. IMPRESSION:  Single loop of slightly dilated small-bowel noted in the left upper quadrant. This is a nonspecific finding. Follow-up exam should be considered to exclude developing small bowel distention. Colon is nondistended. No free air. Electronically Signed   By: Maisie Fus  Register   On: 06/06/2020 07:59   CT CHEST ABDOMEN PELVIS W CONTRAST  Result Date: 06/05/2020 CLINICAL DATA:  Recent bowel surgery. Chest pain, shortness of breath. EXAM: CT CHEST, ABDOMEN, AND PELVIS WITH CONTRAST TECHNIQUE: Multidetector CT imaging of the chest, abdomen and pelvis was performed following the standard protocol during bolus administration of intravenous contrast. CONTRAST:  OMNIPAQUE IOHEXOL 300 MG/ML  SOLN COMPARISON:  None. FINDINGS: CT CHEST FINDINGS Cardiovascular: Tortuous aorta. Heart is normal size. Aorta is normal caliber. Scattered aortic and coronary artery calcifications. Mediastinum/Nodes: No mediastinal, hilar, or axillary adenopathy. Trachea unremarkable. Prior left thyroidectomy. Changes of gastric pull-through. Esophagus is fluid-filled as is the gastric pull-through. Lungs/Pleura: Atelectasis in the right lower lobe adjacent to the gastric pull-through. Otherwise no confluent opacities. No effusions. Musculoskeletal: Chest wall soft tissues are unremarkable. No acute bony abnormality. Postoperative changes in the posterior right lower ribs/chest wall. CT ABDOMEN PELVIS FINDINGS Hepatobiliary: Diffuse low-density throughout the liver compatible with fatty infiltration. No focal abnormality. Gallbladder unremarkable. Pancreas: No focal abnormality or ductal dilatation. Spleen: No focal abnormality.  Normal size. 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 Right kidney unremarkable. No adrenal or renal mass. No hydronephrosis. Gas seen within the urinary bladder, presumably from recent catheterization. Stomach/Bowel: Sigmoid diverticulosis. There is stranding around the  sigmoid colon which could reflect active diverticulitis. Small bowel decompressed. No bowel obstruction. Vascular/Lymphatic: Aortic atherosclerosis. No evidence of aneurysm or adenopathy. Reproductive: No visible focal abnormality. Other: No free fluid or free air. Musculoskeletal: No acute bony abnormality. IMPRESSION: Postoperative changes in the chest and upper abdomen from what appears to be gastric pull-through procedure. The stomach and esophagus are fluid-filled. Right base atelectasis. Scattered coronary artery and aortic calcifications. Hepatic steatosis. Left colonic diverticulosis. Stranding around the sigmoid colon may reflect early active diverticulitis. Electronically Signed   By: Charlett Nose M.D.   On: 06/05/2020 18:52   DG CHEST PORT 1 VIEW  Result Date: 06/07/2020 CLINICAL DATA:  Tachypnea EXAM: PORTABLE CHEST 1 VIEW COMPARISON:  06/05/2020 FINDINGS: Heart size and pulmonary vascularity are normal for technique. Increased density along the right lower chest with pleural thickening likely related to postoperative changes from previous gastric pull-through. Right rib deformities are also likely postoperative. No developing consolidation or edema in the lungs. Degenerative changes in the spine and shoulders. IMPRESSION: Postoperative changes in the right chest. No evidence of active pulmonary disease. Electronically Signed   By: Burman Nieves M.D.   On: 06/07/2020 00:22    ASSESMENT:   *   Acute flare of episodic nausea vomiting,  Abdominal pain.   Complicated hx severe esophagitis, Barrett's esophagus, GI bleeding from esophagitis, spontaneous esophageal rupture requiring repair and postop pulmonary  complications requiring VATS/thoracotomy. Continues on Carafate 1 g bid (same as at home), Protonix 40 IV bid (40 PO daily at  home), scheduled IV Reglan 10 mg q 6 h.    *   Alcoholism, chronic.  Hepatic steatosis on the current and previous imaging.  LFTs normal.  Admits to drinking 1 pint  of brandy on Saturday, the day prior to onset of nausea, vomiting  *    UTI.  Day 3 antibiotics, cefepime.  Urine culture with multiple species present, suggest recollection.  *    AKI.  Mild.  GFR 55.  *    Normocytic anemia.  Hb 10.2.  PRBCs on multiple occasions 2018, 2019 while living in [place].   PLAN   *   Increase carafate to qid.   *   Advance to clear liquid diet.   Colin Hamilton  06/07/2020, 12:53 PM Phone [phone]     Attending Physician Note   I have taken an interval history, reviewed the chart and examined the patient. I agree with the Advanced Practitioner's note, impression and recommendations.   Flare of recurrent nausea, vomiting, abdominal pain is improving. Suspected gastroparesis, GERD. Complex history with severe esophagitis, Barrett's esophagus, GI bleeding from esophagitis, spontaneous esophageal rupture requiring repair and postop pulmonary complications requiring VATS/thoracotomy in 2019.   * Clear liquids, advance very slowly * Increase Carafate to qid * Continue Mylanta qid prn * Continue Reglan IV q6h, change to PO ac & hs as symptoms improve  * Continue PPI IV bid, change to PO bid as symptoms improve * Continue Zofran qid prn  Possible mild diverticulitis. Continue Cefepime.  * Change to PO antibiotics when N/V have improved. * GI going to standby, available for problems, questions  * Outpatient GI follow up with Dr. Jerry Caras, MD [facility] [phone]

## 2020-06-07 NOTE — Progress Notes (Addendum)
PROGRESS NOTE    Arthor Gorter  GYI:[id number] DOB: 1960/06/12 DOA: 06/05/2020 PCP: Pcp, No   Brief Narrative: 60 year old with past medical history significant for esophageal perforation in 2019 status post partial esophagectomy and gastric pull up, gastroesophageal reflux disease, gastritis and esophagitis with Barrett's esophagus (identified by endoscopy November/2021: 9 9 major), hypertension asthma, alcohol use who presents complaining of nausea and vomiting. Patient recently moved down to [place] with some friends over this past weekend.  He developed worsening lower abdominal discomfort.  He report poor appetite, he has been drinking some beer over the weekend, he also report diarrhea the day prior to admission.  Difficulty Initiating Urination Lately. Evaluation in the ED: Potassium 2.7, CT chest abdomen pelvis; was negative for esophageal perforation, evidence of fluid in the esophagus and stomach.  Some possibility of early diverticulitis.   Assessment & Plan:   Principal Problem:   Sepsis secondary to UTI [facility]) Active Problems:   Hypokalemia due to excessive gastrointestinal loss of potassium   Sinus tachycardia   GERD with esophagitis   History of alcohol abuse   Essential hypertension   Intractable nausea and vomiting   Asthma in adult, mild intermittent, uncomplicated   Sigmoid diverticulitis   Sepsis ([place])  1-Sepsis secondary to UTI or early diverticulitis: -Patient presented with SIRS criteria, tachycardia, tachypnea urinalysis suggestive of UTI, CT abdomen possible early diverticulitis -Continue with IV fluids -no further diarrhea.  -Continue with IV cefepime. Day 2.   2-intractable nausea and vomiting; -In setting of prior history of esophageal perforation, resection and gastric pull-up in 2019 -Continue with scheduled Reglan -KUB with some small bowel dilation -GI consulted, plan for medical management.  Started on Carafate. Plan for clear diet.    3-sigmoid diverticulitis: Continue with IV antibiotics  4 Hypokalemia: Replaced.   5-Sinus tachycardia: Prior pressure some history of atrial fibrillation or other tachyarrhythmia in [place] Correct electrolyte.  Follow EKG; sinus tachycardia, trigeminy.   6-GERD with esophagitis: Continue with IV Protonix twice daily  7-History of alcohol use: Continue with CIWA protocol 8-Essential hypertension: Continue with home dose metoprolol  9-Asthma: Continue with nebulizer as needed.  35-KKXFG gap metabolic acidosis: Likely related to vomiting, hypovolemia Worse today, bicarb down to 14. Started Bicarb Gtt.  Repeat B-met tomorrow.  11-AKI; related to hypovolemia; continue with IV fluids. IV bolus.   Estimated body mass index is 23.17 kg/m as calculated from the following:   Height as of this encounter: '5\' 4"'  (1.626 m).   Weight as of this encounter: 61.2 kg.   DVT prophylaxis: Lovenox Code Status: Full code Family Communication: Discussed with patient Disposition Plan:  Status is: Observation  The patient will require care spanning > 2 midnights and should be moved to inpatient because: IV treatments appropriate due to intensity of illness or inability to take PO  Dispo: The patient is from: Home              Anticipated d/c is to: Home              Patient currently is not medically stable to d/c.   Difficult to place patient No        Consultants:   GI  Procedures:   None  Antimicrobials:    Subjective: He report abdominal pain left side, chronic pain.  No further diarrhea or vomiting.    Objective: Vitals:   06/07/20 0600 06/07/20 0709 06/07/20 0832 06/07/20 0952  BP: 104/60 (!) 94/59  114/65  Pulse: 90  70  Resp: (!) 28 (!) 27  (!) 21  Temp: 98.1 F (36.7 C) 98 F (36.7 C)  97.6 F (36.4 C)  TempSrc: Oral Oral  Oral  SpO2: 97% 96% 98% 97%  Weight:      Height:        Intake/Output Summary (Last 24 hours) at 06/07/2020 1346 Last data filed  at 06/07/2020 1038 Gross per 24 hour  Intake 4541.43 ml  Output 450 ml  Net 4091.43 ml   Filed Weights   06/05/20 1328  Weight: 61.2 kg    Examination:  General exam:  NAD Respiratory system: CTA Cardiovascular system: S 1, S 2 RRR Gastrointestinal system: BS present, soft, nt Central nervous system: Alert Extremities: Symmetric power  Data Reviewed: I have personally reviewed following labs and imaging studies  CBC: Recent Labs  Lab 06/05/20 1322 06/06/20 0816 06/07/20 0954  WBC 10.3 11.9* 15.2*  NEUTROABS 7.6 8.6*  --   HGB 13.7 12.0* 10.2*  HCT 42.0 38.0* 32.3*  MCV 92.7 95.5 97.3  PLT 454* 346 326   Basic Metabolic Panel: Recent Labs  Lab 06/05/20 1322 06/05/20 2205 06/06/20 0816 06/07/20 0954  NA 138  --  139 136  K 2.7*  --  3.5 4.1  CL 96*  --  104 107  CO2 25  --  20* 14*  GLUCOSE 114*  --  80 71  BUN 6  --  7 9  CREATININE 1.24  --  1.27* 1.47*  CALCIUM 8.4*  --  7.7* 7.5*  MG  --  1.8  --   --    GFR: Estimated Creatinine Clearance: 45.9 mL/min (A) (by C-G formula based on SCr of 1.47 mg/dL (H)). Liver Function Tests: Recent Labs  Lab 06/05/20 1322 06/06/20 0816  AST 20 19  ALT 18 15  ALKPHOS 104 83  BILITOT 1.0 1.0  PROT 7.7 6.8  ALBUMIN 3.2* 2.9*   No results for input(s): LIPASE, AMYLASE in the last 168 hours. No results for input(s): AMMONIA in the last 168 hours. Coagulation Profile: Recent Labs  Lab 06/06/20 0816  INR 1.2   Cardiac Enzymes: No results for input(s): CKTOTAL, CKMB, CKMBINDEX, TROPONINI in the last 168 hours. BNP (last 3 results) No results for input(s): PROBNP in the last 8760 hours. HbA1C: Recent Labs    06/05/20 2147  HGBA1C 5.4   CBG: No results for input(s): GLUCAP in the last 168 hours. Lipid Profile: No results for input(s): CHOL, HDL, LDLCALC, TRIG, CHOLHDL, LDLDIRECT in the last 72 hours. Thyroid Function Tests: Recent Labs    06/05/20 2147  TSH 1.979   Anemia Panel: No results for  input(s): VITAMINB12, FOLATE, FERRITIN, TIBC, IRON, RETICCTPCT in the last 72 hours. Sepsis Labs: Recent Labs  Lab 06/05/20 2147 06/05/20 2205 06/06/20 0816  PROCALCITON  --  <0.10 <0.10  LATICACIDVEN 1.6  --   --     Recent Results (from the past 240 hour(s))  Urine culture     Status: Abnormal   Collection Time: 06/05/20  3:39 PM   Specimen: Urine, Random  Result Value Ref Range Status   Specimen Description URINE, RANDOM  Final   Special Requests   Final    NONE Performed at [facility] [place] [facility], [address] [place], [place] [address]    Culture MULTIPLE SPECIES PRESENT, SUGGEST RECOLLECTION (A)  Final   Report Status 06/06/2020 FINAL  Final  SARS CORONAVIRUS 2 (TAT 6-24 HRS) Nasopharyngeal Nasopharyngeal Swab  Status: None   Collection Time: 06/05/20  7:30 PM   Specimen: Nasopharyngeal Swab  Result Value Ref Range Status   SARS Coronavirus 2 NEGATIVE NEGATIVE Final    Comment: (NOTE) SARS-CoV-2 target nucleic acids are NOT DETECTED.  The SARS-CoV-2 RNA is generally detectable in upper and lower respiratory specimens during the acute phase of infection. Negative results do not preclude SARS-CoV-2 infection, do not rule out co-infections with other pathogens, and should not be used as the sole basis for treatment or other patient management decisions. Negative results must be combined with clinical observations, patient history, and epidemiological information. The expected result is Negative.  Fact Sheet for Patients: SugarRoll.be  Fact Sheet for Healthcare Providers: https://www.woods-mathews.com/  This test is not yet approved or cleared by the [place] FDA and  has been authorized for detection and/or diagnosis of SARS-CoV-2 by FDA under an Emergency Use Authorization (EUA). This EUA will remain  in effect (meaning this test can be used) for the duration of the COVID-19 declaration under Se ction 564(b)(1) of  the Act, 21 U.S.C. section 360bbb-3(b)(1), unless the authorization is terminated or revoked sooner.  Performed at [facility], [place] [address] [place], [place] [address]   Culture, blood (routine x 2)     Status: None (Preliminary result)   Collection Time: 06/05/20  9:15 PM   Specimen: BLOOD LEFT FOREARM  Result Value Ref Range Status   Specimen Description BLOOD LEFT FOREARM  Final   Special Requests   Final    BOTTLES DRAWN AEROBIC AND ANAEROBIC Blood Culture results may not be optimal due to an inadequate volume of blood received in culture bottles   Culture   Final    NO GROWTH 1 DAY Performed at [facility], [place] [address], [place], [place] [address]    Report Status PENDING  Incomplete  Culture, blood (routine x 2)     Status: None (Preliminary result)   Collection Time: 06/05/20  9:47 PM   Specimen: BLOOD LEFT ARM  Result Value Ref Range Status   Specimen Description BLOOD LEFT ARM  Final   Special Requests   Final    BOTTLES DRAWN AEROBIC AND ANAEROBIC Blood Culture adequate volume   Culture   Final    NO GROWTH 1 DAY Performed at [facility], [place] [address], [place], [place] [address]    Report Status PENDING  Incomplete         Radiology Studies: DG Chest 2 View  Result Date: 06/05/2020 CLINICAL DATA:  Chest pain. EXAM: CHEST - 2 VIEW COMPARISON:  No prior. FINDINGS: Mediastinum and hilar structures normal. Cardiomegaly. No pulmonary venous congestion. Low lung volumes. Mild right lung base atelectasis. Mild right base infiltrate cannot be excluded. Pleural thickening mid right lung base most consistent with scarring. Small right pleural effusion versus pleural scarring. No pneumothorax. Deformity noted of the right seventh and possibly eighth ribs. These may represent old fractures. Degenerative change thoracic spine. Surgical clips lower chest. Hiatal hernia again noted. IMPRESSION: 1.  Cardiomegaly.  No pulmonary venous congestion. 2. Mild  right base atelectasis. Mild right base infiltrate cannot be excluded. Mid right lung base pleural thickening consistent with scarring. Tiny right pleural effusion and or pleural scarring. 3. Deformity noted the right seventh and possibly eighth ribs. These may represent old fractures. No pneumothorax. 4.  Hiatal hernia again noted. Electronically Signed   By: [place]   On: 06/05/2020 14:58   DG Abd 1  View  Result Date: 06/06/2020 CLINICAL DATA:  Nausea and vomiting. EXAM: ABDOMEN - 1 VIEW COMPARISON:  CT 06/05/2020. FINDINGS: Surgical clips lower chest/upper abdomen. Right base pleural-parenchymal thickening most consistent scarring. Adjacent right lower old rib fracture noted. Single loop of slightly dilated small bowel noted in the left upper quadrant. This is nonspecific finding. Follow-up exam should be considered to exclude developing small bowel distention. Colon is nondistended. No free air. Aortoiliac atherosclerotic vascular calcification. Pelvic calcifications consistent phleboliths. Degenerative change thoracolumbar spine and both hips. IMPRESSION: Single loop of slightly dilated small-bowel noted in the left upper quadrant. This is a nonspecific finding. Follow-up exam should be considered to exclude developing small bowel distention. Colon is nondistended. No free air. Electronically Signed   By: Marcello Moores  Register   On: 06/06/2020 07:59   CT CHEST ABDOMEN PELVIS W CONTRAST  Result Date: 06/05/2020 CLINICAL DATA:  Recent bowel surgery. Chest pain, shortness of breath. EXAM: CT CHEST, ABDOMEN, AND PELVIS WITH CONTRAST TECHNIQUE: Multidetector CT imaging of the chest, abdomen and pelvis was performed following the standard protocol during bolus administration of intravenous contrast. CONTRAST:  125m OMNIPAQUE IOHEXOL 300 MG/ML  SOLN COMPARISON:  None. FINDINGS: CT CHEST FINDINGS Cardiovascular: Tortuous aorta. Heart is normal size. Aorta is normal caliber. Scattered aortic and coronary  artery calcifications. Mediastinum/Nodes: No mediastinal, hilar, or axillary adenopathy. Trachea unremarkable. Prior left thyroidectomy. Changes of gastric pull-through. Esophagus is fluid-filled as is the gastric pull-through. Lungs/Pleura: Atelectasis in the right lower lobe adjacent to the gastric pull-through. Otherwise no confluent opacities. No effusions. Musculoskeletal: Chest wall soft tissues are unremarkable. No acute bony abnormality. Postoperative changes in the posterior right lower ribs/chest wall. CT ABDOMEN PELVIS FINDINGS Hepatobiliary: Diffuse low-density throughout the liver compatible with fatty infiltration. No focal abnormality. Gallbladder unremarkable. Pancreas: No focal abnormality or ductal dilatation. Spleen: No focal abnormality.  Normal size. 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 Right kidney unremarkable. No adrenal or renal mass. No hydronephrosis. Gas seen within the urinary bladder, presumably from recent catheterization. Stomach/Bowel: Sigmoid diverticulosis. There is stranding around the sigmoid colon which could reflect active diverticulitis. Small bowel decompressed. No bowel obstruction. Vascular/Lymphatic: Aortic atherosclerosis. No evidence of aneurysm or adenopathy. Reproductive: No visible focal abnormality. Other: No free fluid or free air. Musculoskeletal: No acute bony abnormality. IMPRESSION: Postoperative changes in the chest and upper abdomen from what appears to be gastric pull-through procedure. The stomach and esophagus are fluid-filled. Right base atelectasis. Scattered coronary artery and aortic calcifications. Hepatic steatosis. Left colonic diverticulosis. Stranding around the sigmoid colon may reflect early active diverticulitis. Electronically Signed   By: KRolm BaptiseM.D.   On: 06/05/2020 18:52   DG CHEST PORT 1 VIEW  Result Date: 06/07/2020 CLINICAL DATA:  Tachypnea EXAM: PORTABLE CHEST 1 VIEW COMPARISON:   06/05/2020 FINDINGS: Heart size and pulmonary vascularity are normal for technique. Increased density along the right lower chest with pleural thickening likely related to postoperative changes from previous gastric pull-through. Right rib deformities are also likely postoperative. No developing consolidation or edema in the lungs. Degenerative changes in the spine and shoulders. IMPRESSION: Postoperative changes in the right chest. No evidence of active pulmonary disease. Electronically Signed   By: WLucienne CapersM.D.   On: 06/07/2020 00:22        Scheduled Meds: . enoxaparin (LOVENOX) injection  40 mg Subcutaneous Q24H  . fluticasone furoate-vilanterol  1 puff Inhalation Daily  . folic acid  1 mg Oral Daily  . metoCLOPramide (REGLAN)  injection  5 mg Intravenous Q6H  . metoprolol tartrate  50 mg Oral BID  . multivitamin with minerals  1 tablet Oral Daily  . pantoprazole (PROTONIX) IV  40 mg Intravenous Q12H  . QUEtiapine  50 mg Oral QHS  . sucralfate  1 g Oral TID AC & HS  . thiamine  100 mg Oral Daily   Or  . thiamine  100 mg Intravenous Daily  . umeclidinium bromide  1 puff Inhalation Daily   Continuous Infusions: . acetaminophen    . ceFEPime (MAXIPIME) IV 2 g (06/07/20 0930)  . sodium bicarbonate 150 mEq in D5W infusion       LOS: 1 day    Time spent: 35 minutes    Ziair Penson A Vanassa Penniman, MD [facility]   If 7PM-7AM, please contact night-coverage [email or web address]  06/07/2020, 1:46 PM

## 2020-06-08 ENCOUNTER — Other Ambulatory Visit (HOSPITAL_COMMUNITY): Payer: Self-pay

## 2020-06-08 DIAGNOSIS — N390 Urinary tract infection, site not specified: Secondary | ICD-10-CM | POA: Diagnosis not present

## 2020-06-08 DIAGNOSIS — A419 Sepsis, unspecified organism: Secondary | ICD-10-CM | POA: Diagnosis not present

## 2020-06-08 LAB — CBC
HCT: 32.3 % — ABNORMAL LOW (ref 39.0–52.0)
Hemoglobin: 10.8 g/dL — ABNORMAL LOW (ref 13.0–17.0)
MCH: 30.3 pg (ref 26.0–34.0)
MCHC: 33.4 g/dL (ref 30.0–36.0)
MCV: 90.5 fL (ref 80.0–100.0)
Platelets: 279 10*3/uL (ref 150–400)
RBC: 3.57 MIL/uL — ABNORMAL LOW (ref 4.22–5.81)
RDW: 14.3 % (ref 11.5–15.5)
WBC: 12 10*3/uL — ABNORMAL HIGH (ref 4.0–10.5)
nRBC: 0 % (ref 0.0–0.2)

## 2020-06-08 LAB — BASIC METABOLIC PANEL
Anion gap: 7 (ref 5–15)
BUN: 6 mg/dL (ref 6–20)
CO2: 27 mmol/L (ref 22–32)
Calcium: 7.5 mg/dL — ABNORMAL LOW (ref 8.9–10.3)
Chloride: 99 mmol/L (ref 98–111)
Creatinine, Ser: 1.18 mg/dL (ref 0.61–1.24)
GFR, Estimated: >60 mL/min (ref >60)
Glucose, Bld: 118 mg/dL — ABNORMAL HIGH (ref 70–99)
Potassium: 3 mmol/L — ABNORMAL LOW (ref 3.5–5.1)
Sodium: 133 mmol/L — ABNORMAL LOW (ref 135–145)

## 2020-06-08 MED ORDER — POTASSIUM CHLORIDE CRYS ER 20 MEQ PO TBCR
40.0000 meq | EXTENDED_RELEASE_TABLET | Freq: Once | ORAL | Status: AC
  Administered 2020-06-08: 40 meq via ORAL
  Filled 2020-06-08: qty 2

## 2020-06-08 MED ORDER — POTASSIUM CHLORIDE 10 MEQ/100ML IV SOLN
10.0000 meq | INTRAVENOUS | Status: DC
  Administered 2020-06-08: 10 meq via INTRAVENOUS
  Filled 2020-06-08: qty 100

## 2020-06-08 MED ORDER — VANCOMYCIN HCL 125 MG PO CAPS
125.0000 mg | ORAL_CAPSULE | Freq: Four times a day (QID) | ORAL | 0 refills | Status: AC
  Filled 2020-06-08: qty 36, 9d supply, fill #0

## 2020-06-08 MED ORDER — LACTATED RINGERS IV SOLN: INTRAVENOUS | Status: DC

## 2020-06-08 NOTE — Progress Notes (Signed)
   06/08/20 0008  Assess: MEWS Score  Temp 98.9 F (37.2 C)  BP 122/60  Pulse Rate 99  ECG Heart Rate (!) 107  Resp (!) 26  Level of Consciousness Alert  O2 Device Room Air  Assess: MEWS Score  MEWS Temp 0  MEWS Systolic 0  MEWS Pulse 1  MEWS RR 2  MEWS LOC 0  MEWS Score 3  MEWS Score Color Yellow  Assess: if the MEWS score is Yellow or Red  Were vital signs taken at a resting state? Yes  Focused Assessment No change from prior assessment  Early Detection of Sepsis Score *See Row Information* Low  MEWS guidelines implemented *See Row Information* Yes  Take Vital Signs  Increase Vital Sign Frequency  Yellow: Q 2hr X 2 then Q 4hr X 2, if remains yellow, continue Q 4hrs  Escalate  MEWS: Escalate Yellow: discuss with charge nurse/RN and consider discussing with provider and RRT  Notify: Charge Nurse/RN  Name of Charge Nurse/RN Notified Pennington, RN  Date Charge Nurse/RN Notified 06/08/20  Time Charge Nurse/RN Notified 0100  Document  Progress note created (see row info) Yes

## 2020-06-08 NOTE — Progress Notes (Signed)
   06/08/20 2000  Assess: MEWS Score  BP 126/81  Pulse Rate 94  ECG Heart Rate (!) 103  Resp (!) 30  Level of Consciousness Alert  SpO2 92 %  Assess: MEWS Score  MEWS Temp 0  MEWS Systolic 0  MEWS Pulse 1  MEWS RR 2  MEWS LOC 0  MEWS Score 3  MEWS Score Color Yellow  Treat  Pain Scale 0-10  Pain Score 10  Pain Type Acute pain  Pain Location Abdomen  Pain Descriptors / Indicators Aching;Discomfort  Pain Frequency Constant  Pain Onset On-going  Pain Intervention(s) Medication (See eMAR)  Take Vital Signs  Increase Vital Sign Frequency  Yellow: Q 2hr X 2 then Q 4hr X 2, if remains yellow, continue Q 4hrs  Escalate  MEWS: Escalate Yellow: discuss with charge nurse/RN and consider discussing with provider and RRT  Notify: Charge Nurse/RN  Name of Charge Nurse/RN Notified Nikki, RN  Date Charge Nurse/RN Notified 06/08/20  Document  Patient Outcome Stabilized after interventions

## 2020-06-08 NOTE — TOC Initial Note (Signed)
Transition of Care [facility]) - Initial/Assessment Note    Patient Details  Name: Colin Hamilton MRN: [id number] Date of Birth: 10/30/1960  Transition of Care [facility]) CM/SW Contact:    [place] Pink, Student-Social Work Phone Number: 06/08/2020, 4:08 PM  Clinical Narrative:                 CSW intern received consult to discuss possible alcohol use resources with patient. CSW intern had lengthy discussion with patient about his health history and how he has dealt with it. Patient indicated that he occasionally drinks to help cope with the pain he experiences from his chronic illness and that he does not need resources at this time. He reported that he had just moved here from [facility] and was hospitalized the day after his arrival. Though he has days where he is experiencing a lot of pain he reported that he is hopeful the doctors can find a solution. The patient reported that he does not have a PCP in [place] and would like one. Patient reported that he had no further concerns at this time and is hopeful he will have less hospital visits in the future. CSW set patient up with PCP, information regarding this can be found on the AVS.   Expected Discharge Plan: Home/Self Care Barriers to Discharge: Continued Medical Work up   Patient Goals and CMS Choice Patient states their goals for this hospitalization and ongoing recovery are:: To feel better      Expected Discharge Plan and Services Expected Discharge Plan: Home/Self Care In-house Referral: Clinical Social Work Discharge Planning Services: CM Consult                                          Prior Living Arrangements/Services   Lives with:: Self Patient language and need for interpreter reviewed:: Yes Do you feel safe going back to the place where you live?: Yes      Need for Family Participation in Patient Care: No (Comment) Care giver support system in place?: No (comment)   Criminal Activity/Legal Involvement Pertinent  to Current Situation/Hospitalization: No - Comment as needed  Activities of Daily Living      Permission Sought/Granted Permission sought to share information with : Case Manager Permission granted to share information with : Yes, Verbal Permission Granted     Permission granted to share info w AGENCY: Primary Care Physican        Emotional Assessment Appearance:: Appears stated age Attitude/Demeanor/Rapport: Engaged Affect (typically observed): Accepting,Appropriate,Calm Orientation: : Oriented to Self,Oriented to Place,Oriented to  Time,Oriented to Situation Alcohol / Substance Use: Alcohol Use Psych Involvement: No (comment)  Admission diagnosis:  Hypokalemia [E87.6] Nausea & vomiting [R11.2] Abdominal pain [R10.9] Sepsis (HCC) [A41.9] Nausea and vomiting, intractability of vomiting not specified, unspecified vomiting type [R11.2] Patient Active Problem List   Diagnosis Date Noted  . Abdominal pain   . Sepsis (HCC) 06/06/2020  . Sepsis secondary to UTI (HCC) 06/05/2020  . Hypokalemia due to excessive gastrointestinal loss of potassium 06/05/2020  . Sinus tachycardia 06/05/2020  . GERD with esophagitis 06/05/2020  . History of alcohol abuse 06/05/2020  . Essential hypertension 06/05/2020  . Intractable nausea and vomiting 06/05/2020  . Asthma in adult, mild intermittent, uncomplicated 06/05/2020  . Sigmoid diverticulitis 06/05/2020   PCP:  Pcp, No Pharmacy:   [facility] [address], [place] - [address]  [address] [place] [address] Phone: [phone] Fax: [phone]  [facility] [address] [place] [address] Phone: [phone] Fax: [phone]     Social Determinants of Health (SDOH) Interventions    Readmission Risk Interventions No flowsheet data found.

## 2020-06-08 NOTE — Progress Notes (Signed)
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 Patient recently moved down to [place] with some friends over this past weekend.  He developed worsening lower abdominal discomfort.  He report poor appetite, he has been drinking some beer over the weekend, he also report diarrhea the day prior to admission.  Difficulty Initiating Urination Lately. Evaluation in the ED: Potassium 2.7, CT chest abdomen pelvis; was negative for esophageal perforation, evidence of fluid in the esophagus and stomach.  Some possibility of early diverticulitis.   Assessment & Plan:   Principal Problem:   Sepsis secondary to UTI [facility]) Active Problems:   Hypokalemia due to excessive gastrointestinal loss of potassium   Sinus tachycardia   GERD with esophagitis   History of alcohol abuse   Essential hypertension   Intractable nausea and vomiting   Asthma in adult, mild intermittent, uncomplicated   Sigmoid diverticulitis   Sepsis (HCC)   Abdominal pain  1-Sepsis secondary to early diverticulitis, C diff colitis.  -Patient presented with SIRS criteria, tachycardia, tachypnea urinalysis suggestive of UTI, CT abdomen possible early diverticulitis -Continue with IV fluids -no further diarrhea.  -Received cefepime for 2 days.  -C diff positive, started on Vancomycin 4/07  2-intractable nausea and vomiting; -In setting of prior history of esophageal perforation, resection and gastric pull-up in 2019 -Continue with scheduled Reglan IV. Transition to oral tomorrow.  -KUB with some small bowel  dilation -GI consulted, plan for medical management.  Started on Carafate. Advance diet to soft today.  Improving.   3-sigmoid diverticulitis/ C diff colitis;  Started Oral Vancomycin.  WBC trending down.  Stop cefepime due to positive C diff   4 Hypokalemia: Replaced.   5-Sinus tachycardia: Prior pressure some history of atrial fibrillation or other tachyarrhythmia in [place] Correct electrolyte.  Follow EKG; sinus tachycardia, trigeminy.   6-GERD with esophagitis: Continue with IV Protonix twice daily  7-History of alcohol use: Continue with CIWA protocol 8-Essential hypertension: Continue with home dose metoprolol  9-Asthma: Continue with nebulizer as needed.  10-Anion gap metabolic acidosis: Likely related to vomiting, hypovolemia Worse today, bicarb down to 14. Resolved with bicarb gtt.   11-AKI; related to hypovolemia; continue with IV fluids. IV bolus.   Estimated body mass index is 23.17 kg/m as calculated from the following:   Height as of this encounter: 5\' 4"  (1.626 m).   Weight as of this encounter: 61.2 kg.   DVT prophylaxis: Lovenox Code Status: Full code Family Communication: Discussed with patient Disposition Plan:  Status is: Observation  The patient will require care spanning > 2 midnights and should be moved to inpatient because: IV treatments appropriate due to intensity of illness or inability to take PO  Dispo: The patient is from: Home              Anticipated d/c is to: Home              Patient currently is not medically stable to d/c.   Difficult to place patient No        Consultants:   GI  Procedures:   None  Antimicrobials:    Subjective: He is feeling better, he had  3 Watery BM yesterday. One so far this am.  Still with abdominal pain.  Tolerating clear diet  Objective: Vitals:   06/08/20 0400 06/08/20 0737 06/08/20 0803 06/08/20 1157  BP: 107/67 99/76  118/71  Pulse: 63 95 (!) 101 91  Resp: (!) 24 (!) 22  18   Temp: 98 F (36.7 C) 97.9 F (36.6 C)  97.7 F (36.5 C)  TempSrc: Axillary Oral  Oral  SpO2: 93% 94%  100%  Weight:      Height:        Intake/Output Summary (Last 24 hours) at 06/08/2020 1527 Last data filed at 06/08/2020 1200 Gross per 24 hour  Intake 1565.68 ml  Output 1625 ml  Net -59.32 ml   Filed Weights   06/05/20 1328  Weight: 61.2 kg    Examination:  General exam:  NAD Respiratory system: CTA Cardiovascular system: S 1, S 2 RRR Gastrointestinal system: BS present, soft, tender Central nervous system: Alert Extremities: no edema  Data Reviewed: I have personally reviewed following labs and imaging studies  CBC: Recent Labs  Lab 06/05/20 1322 06/06/20 0816 06/07/20 0954 06/08/20 0222  WBC 10.3 11.9* 15.2* 12.0*  NEUTROABS 7.6 8.6*  --   --   HGB 13.7 12.0* 10.2* 10.8*  HCT 42.0 38.0* 32.3* 32.3*  MCV 92.7 95.5 97.3 90.5  PLT 454* 346 246 279   Basic Metabolic Panel: Recent Labs  Lab 06/05/20 1322 06/05/20 2205 06/06/20 0816 06/07/20 0954 06/08/20 0222  NA 138  --  139 136 133*  K 2.7*  --  3.5 4.1 3.0*  CL 96*  --  104 107 99  CO2 25  --  20* 14* 27  GLUCOSE 114*  --  80 71 118*  BUN 6  --  7 9 6   CREATININE 1.24  --  1.27* 1.47* 1.18  CALCIUM 8.4*  --  7.7* 7.5* 7.5*  MG  --  1.8  --   --   --    GFR: Estimated Creatinine Clearance: 57.1 mL/min (by C-G formula based on SCr of 1.18 mg/dL). Liver Function Tests: Recent Labs  Lab 06/05/20 1322 06/06/20 0816  AST 20 19  ALT 18 15  ALKPHOS 104 83  BILITOT 1.0 1.0  PROT 7.7 6.8  ALBUMIN 3.2* 2.9*   No results for input(s): LIPASE, AMYLASE in the last 168 hours. No results for input(s): AMMONIA in the last 168 hours. Coagulation Profile: Recent Labs  Lab 06/06/20 0816  INR 1.2   Cardiac Enzymes: No results for input(s): CKTOTAL, CKMB, CKMBINDEX, TROPONINI in the last 168 hours. BNP (last 3 results) No results for input(s): PROBNP in the last 8760 hours. HbA1C: Recent Labs     06/05/20 2147  HGBA1C 5.4   CBG: No results for input(s): GLUCAP in the last 168 hours. Lipid Profile: No results for input(s): CHOL, HDL, LDLCALC, TRIG, CHOLHDL, LDLDIRECT in the last 72 hours. Thyroid Function Tests: Recent Labs    06/05/20 2147  TSH 1.979   Anemia Panel: No results for input(s): VITAMINB12, FOLATE, FERRITIN, TIBC, IRON, RETICCTPCT in the last 72 hours. Sepsis Labs: Recent Labs  Lab 06/05/20 2147 06/05/20 2205 06/06/20 0816  PROCALCITON  --  <0.10 <0.10  LATICACIDVEN 1.6  --   --     Recent Results (from the past 240 hour(s))  Urine culture     Status: Abnormal   Collection Time: 06/05/20  3:39 PM   Specimen: Urine, Random  Result Value Ref Range Status  Specimen Description URINE, RANDOM  Final   Special Requests   Final    NONE Performed at [facility], [address] [place], [place] [address]    Culture MULTIPLE SPECIES PRESENT, SUGGEST RECOLLECTION (A)  Final   Report Status 06/06/2020 FINAL  Final  SARS CORONAVIRUS 2 (TAT 6-24 HRS) Nasopharyngeal Nasopharyngeal Swab     Status: None   Collection Time: 06/05/20  7:30 PM   Specimen: Nasopharyngeal Swab  Result Value Ref Range Status   SARS Coronavirus 2 NEGATIVE NEGATIVE Final    Comment: (NOTE) SARS-CoV-2 target nucleic acids are NOT DETECTED.  The SARS-CoV-2 RNA is generally detectable in upper and lower respiratory specimens during the acute phase of infection. Negative results do not preclude SARS-CoV-2 infection, do not rule out co-infections with other pathogens, and should not be used as the sole basis for treatment or other patient management decisions. Negative results must be combined with clinical observations, patient history, and epidemiological information. The expected result is Negative.  Fact Sheet for Patients: HairSlick.nohttps://www.fda.gov/media/138098/download  Fact Sheet for Healthcare Providers: quierodirigir.comhttps://www.fda.gov/media/138095/download  This test is not yet  approved or cleared by the [place] FDA and  has been authorized for detection and/or diagnosis of SARS-CoV-2 by FDA under an Emergency Use Authorization (EUA). This EUA will remain  in effect (meaning this test can be used) for the duration of the COVID-19 declaration under Se ction 564(b)(1) of the Act, 21 U.S.C. section 360bbb-3(b)(1), unless the authorization is terminated or revoked sooner.  Performed at [facility], [address] [place], [place] [address]   Culture, blood (routine x 2)     Status: None (Preliminary result)   Collection Time: 06/05/20  9:15 PM   Specimen: BLOOD LEFT FOREARM  Result Value Ref Range Status   Specimen Description BLOOD LEFT FOREARM  Final   Special Requests   Final    BOTTLES DRAWN AEROBIC AND ANAEROBIC Blood Culture results may not be optimal due to an inadequate volume of blood received in culture bottles   Culture   Final    NO GROWTH 3 DAYS Performed at [facility], [address] [place], [place] [address]    Report Status PENDING  Incomplete  Culture, blood (routine x 2)     Status: None (Preliminary result)   Collection Time: 06/05/20  9:47 PM   Specimen: BLOOD LEFT ARM  Result Value Ref Range Status   Specimen Description BLOOD LEFT ARM  Final   Special Requests   Final    BOTTLES DRAWN AEROBIC AND ANAEROBIC Blood Culture adequate volume   Culture   Final    NO GROWTH 3 DAYS Performed at [facility], [address] [place], [place] [address]    Report Status PENDING  Incomplete  C Difficile Quick Screen w PCR reflex     Status: Abnormal   Collection Time: 06/07/20  4:34 PM   Specimen: STOOL  Result Value Ref Range Status   C Diff antigen POSITIVE (A) NEGATIVE Final   C Diff toxin NEGATIVE NEGATIVE Final   C Diff interpretation Results are indeterminate. See PCR results.  Final    Comment: Performed at [facility], [address] [place], [place] [address]  C. Diff by PCR, Reflexed     Status:  Abnormal   Collection Time: 06/07/20  4:34 PM  Result Value Ref Range Status   Toxigenic C. Difficile by PCR POSITIVE (A) NEGATIVE Final  Comment: Positive for toxigenic C. difficile with little to no toxin production. Only treat if clinical presentation suggests symptomatic illness. Performed at [facility], [address] [place], [place] [address]          Radiology Studies: DG CHEST PORT 1 VIEW  Result Date: 06/07/2020 CLINICAL DATA:  Tachypnea EXAM: PORTABLE CHEST 1 VIEW COMPARISON:  06/05/2020 FINDINGS: Heart size and pulmonary vascularity are normal for technique. Increased density along the right lower chest with pleural thickening likely related to postoperative changes from previous gastric pull-through. Right rib deformities are also likely postoperative. No developing consolidation or edema in the lungs. Degenerative changes in the spine and shoulders. IMPRESSION: Postoperative changes in the right chest. No evidence of active pulmonary disease. Electronically Signed   By: Burman Nieves M.D.   On: 06/07/2020 00:22        Scheduled Meds: . enoxaparin (LOVENOX) injection  40 mg Subcutaneous Q24H  . fluticasone furoate-vilanterol  1 puff Inhalation Daily  . folic acid  1 mg Oral Daily  . metoCLOPramide (REGLAN) injection  5 mg Intravenous Q6H  . metoprolol tartrate  50 mg Oral BID  . multivitamin with minerals  1 tablet Oral Daily  . pantoprazole (PROTONIX) IV  40 mg Intravenous Q12H  . QUEtiapine  50 mg Oral QHS  . sucralfate  1 g Oral TID AC & HS  . thiamine  100 mg Oral Daily  . umeclidinium bromide  1 puff Inhalation Daily  . vancomycin  125 mg Oral QID   Continuous Infusions: . lactated ringers 100 mL/hr at 06/08/20 0754     LOS: 2 days    Time spent: 35 minutes    Rever Pichette A Zendaya Groseclose, MD [facility]   If 7PM-7AM, please contact night-coverage [email or web address]  06/08/2020, 3:27 PM

## 2020-06-08 NOTE — TOC Benefit Eligibility Note (Signed)
Patient Product/process development scientist completed.    The patient is currently admitted and upon discharge could be taking Vancomycin 125 mg Cap.  The current 10 day co-pay is, $1.35.   The patient is insured through [employer] Part D     [name], CPhT Pharmacy Patient Advocate Specialist [facility] Antimicrobial Stewardship Team Direct Number: [phone]  Fax: [phone]

## 2020-06-09 DIAGNOSIS — A419 Sepsis, unspecified organism: Secondary | ICD-10-CM | POA: Diagnosis not present

## 2020-06-09 DIAGNOSIS — R6520 Severe sepsis without septic shock: Secondary | ICD-10-CM

## 2020-06-09 DIAGNOSIS — K2100 Gastro-esophageal reflux disease with esophagitis, without bleeding: Secondary | ICD-10-CM | POA: Diagnosis not present

## 2020-06-09 DIAGNOSIS — R112 Nausea with vomiting, unspecified: Secondary | ICD-10-CM | POA: Diagnosis not present

## 2020-06-09 DIAGNOSIS — N390 Urinary tract infection, site not specified: Secondary | ICD-10-CM | POA: Diagnosis not present

## 2020-06-09 DIAGNOSIS — N179 Acute kidney failure, unspecified: Secondary | ICD-10-CM

## 2020-06-09 LAB — BASIC METABOLIC PANEL
Anion gap: 6 (ref 5–15)
BUN: <5 mg/dL — ABNORMAL LOW (ref 6–20)
CO2: 25 mmol/L (ref 22–32)
Calcium: 7.9 mg/dL — ABNORMAL LOW (ref 8.9–10.3)
Chloride: 104 mmol/L (ref 98–111)
Creatinine, Ser: 1.16 mg/dL (ref 0.61–1.24)
GFR, Estimated: >60 mL/min (ref >60)
Glucose, Bld: 122 mg/dL — ABNORMAL HIGH (ref 70–99)
Potassium: 4.2 mmol/L (ref 3.5–5.1)
Sodium: 135 mmol/L (ref 135–145)

## 2020-06-09 LAB — CBC
HCT: 30.6 % — ABNORMAL LOW (ref 39.0–52.0)
Hemoglobin: 10 g/dL — ABNORMAL LOW (ref 13.0–17.0)
MCH: 30.3 pg (ref 26.0–34.0)
MCHC: 32.7 g/dL (ref 30.0–36.0)
MCV: 92.7 fL (ref 80.0–100.0)
Platelets: 277 10*3/uL (ref 150–400)
RBC: 3.3 MIL/uL — ABNORMAL LOW (ref 4.22–5.81)
RDW: 14.6 % (ref 11.5–15.5)
WBC: 10.4 10*3/uL (ref 4.0–10.5)
nRBC: 0 % (ref 0.0–0.2)

## 2020-06-09 MED ORDER — METOCLOPRAMIDE HCL 5 MG PO TABS: 5.0000 mg | ORAL_TABLET | Freq: Three times a day (TID) | ORAL | 0 refills | Status: AC | PRN

## 2020-06-09 NOTE — Discharge Summary (Signed)
PATIENT DETAILS Name: Colin Hamilton Age: 60 y.o. Sex: male Date of Birth: [birth year] MRN: [id number]. Admitting Physician: Alba Cory, MD PCP:Pcp, No  Admit Date: 06/05/2020 Discharge date: 06/09/2020  Recommendations for Outpatient Follow-up:  1. Follow up with PCP in 1-2 weeks 2. Please obtain CMP/CBC in one week   Admitted From:  Home  Disposition: Home   Home Health: No  Equipment/Devices: None  Discharge Condition: Stable  CODE STATUS: FULL CODE  Diet recommendation:  Diet Order            Diet - low sodium heart healthy           DIET SOFT Room service appropriate? 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  See below for further details.  Brief Hospital Course: Sepsis due to C. difficile colitis: Sepsis physiology has resolved-diarrhea has markedly improved-had 2 small BMs yesterday-stools getting formed.  Initially was on cefepime-however once stool studies came back positive for C. difficile-transition to oral vancomycin on 4/7.  Continue oral vancomycin on discharge.  Intractable nausea with vomiting: Felt to be due to gastroparesis flare-GI followed-tolerating advancement in diet-on a soft diet today.  Anion gap metabolic acidosis: Due to AKI/diarrhea-resolved.  AKI: Mild-likely hemodynamically mediated-resolved with supportive care.  Hypokalemia: Resolved  GERD/esophagitis: Continue PPI  History of alcohol use: No signs of withdrawal-treated with Ativan per CIWA protocol.  HTN: Continue metoprolol  Bronchial asthma: Stable-continue with inhalers.  History of Barrett's esophagus: Continue PPI-resume outpatient follow-up with  gastroenterology for continued surveillance.  Discharge Diagnoses:  Principal Problem:   Sepsis secondary to UTI [facility]) Active Problems:   Hypokalemia due to excessive gastrointestinal loss of potassium   Sinus tachycardia   GERD with esophagitis   History of alcohol abuse   Essential hypertension   Intractable nausea and vomiting   Asthma in adult, mild intermittent, uncomplicated   Sigmoid diverticulitis   Sepsis (HCC)   Abdominal pain   Discharge Instructions:  Activity:  As tolerated   Discharge Instructions    Call MD for:  persistant nausea and vomiting   Complete by: As directed    Diet - low sodium heart healthy   Complete by: As directed    Please consume small portion meals   Discharge instructions   Complete by: As directed    Follow with Primary MD  1-2 weeks  Please get a complete blood count and chemistry panel checked by your Primary MD at your next visit, and again as instructed by your Primary MD.  Get Medicines reviewed and adjusted: Please take all your medications with you for your next visit with your Primary MD  Laboratory/radiological data: Please request your Primary MD to go over all hospital tests and procedure/radiological results at the follow up, please ask your Primary MD to get all Hospital records sent to his/her office.  In some cases, they will be blood work, cultures and biopsy results pending at the time of your discharge. Please request that your primary care M.D. follows up on these results.  Also Note the following: If you experience worsening of your admission symptoms, develop shortness of breath, life threatening emergency, suicidal or homicidal thoughts  you must seek medical attention immediately by calling 911 or calling your MD immediately  if symptoms less severe.  You must read complete instructions/literature along with all the possible adverse reactions/side effects for all the Medicines you take and that have been  prescribed to you. Take any new Medicines after you have completely understood and accpet all the possible adverse reactions/side effects.   Do not drive when taking Pain medications or sleeping medications (Benzodaizepines)  Do not take more than prescribed Pain, Sleep and Anxiety Medications. It is not advisable to combine anxiety,sleep and pain medications without talking with your primary care practitioner  Special Instructions: If you have smoked or chewed Tobacco  in the last 2 yrs please stop smoking, stop any regular Alcohol  and or any Recreational drug use.  Wear Seat belts while driving.  Please note: You were cared for by a hospitalist during your hospital stay. Once you are discharged, your primary care physician will handle any further medical issues. Please note that NO REFILLS for any discharge medications will be authorized once you are discharged, as it is imperative that you return to your primary care physician (or establish a relationship with a primary care physician if you do not have one) for your post hospital discharge needs so that they can reassess your need for medications and monitor your lab values.   Increase activity slowly   Complete by: As directed      Allergies as of 06/09/2020      Reactions   Penicillins       Medication List    TAKE these medications   albuterol 108 (90 Base) MCG/ACT inhaler Commonly known as: VENTOLIN HFA Inhale 2 puffs into the lungs every 6 (six) hours as needed for wheezing or shortness of breath.   docusate sodium 100 MG capsule Commonly known as: COLACE Take 100 mg by mouth 2 (two) times daily.   Ensure Take 237 mLs by mouth 2 (two) times daily as needed (feeding supplement).   famotidine 40 MG tablet Commonly known as: PEPCID Take 40 mg by mouth at bedtime as needed for heartburn or indigestion.   metoCLOPramide 5 MG tablet Commonly known as: Reglan Take 1 tablet (5 mg total) by mouth every 8 (eight) hours as  needed for nausea.   metoprolol tartrate 50 MG tablet Commonly known as: LOPRESSOR Take 50 mg by mouth 2 (two) times daily.   ondansetron 4 MG tablet Commonly known as: ZOFRAN Take 4 mg by mouth every 8 (eight) hours as needed for nausea or vomiting.   pantoprazole 40 MG tablet Commonly known as: PROTONIX Take 40 mg by mouth daily.   QUEtiapine 50 MG tablet Commonly known as: SEROQUEL Take 50 mg by mouth at bedtime.   sucralfate 1 g tablet Commonly known as: CARAFATE Take 1 g by mouth 2 (two) times daily before a meal.   Trelegy Ellipta 100-62.5-25 MCG/INH Aepb Generic drug: Fluticasone-Umeclidin-Vilant Inhale 1 puff into the lungs daily.   vancomycin 125 MG capsule Commonly known as: VANCOCIN Take 1 capsule (125 mg total) by mouth 4 (four) times daily for 9 days.       Follow-up Information    [facility]. Go on 07/25/2020.   Why: 10:30am with Marylene LandAngela McClung-hospital follow up. They will contact your cell phone to confirm appt.  Contact information: [address] [place] [phone]       Tressia DanasBeavers, Kimberly, MD Follow up in 2 week(s).  Specialty: Gastroenterology Contact information: [address] [place] [address] [phone]              Allergies  Allergen Reactions  . Penicillins       Consultations:   GI   Other Procedures/Studies: DG Chest 2 View  Result Date: 06/05/2020 CLINICAL DATA:  Chest pain. EXAM: CHEST - 2 VIEW COMPARISON:  No prior. FINDINGS: Mediastinum and hilar structures normal. Cardiomegaly. No pulmonary venous congestion. Low lung volumes. Mild right lung base atelectasis. Mild right base infiltrate cannot be excluded. Pleural thickening mid right lung base most consistent with scarring. Small right pleural effusion versus pleural scarring. No pneumothorax. Deformity noted of the right seventh and possibly eighth ribs. These may represent old fractures.  Degenerative change thoracic spine. Surgical clips lower chest. Hiatal hernia again noted. IMPRESSION: 1.  Cardiomegaly.  No pulmonary venous congestion. 2. Mild right base atelectasis. Mild right base infiltrate cannot be excluded. Mid right lung base pleural thickening consistent with scarring. Tiny right pleural effusion and or pleural scarring. 3. Deformity noted the right seventh and possibly eighth ribs. These may represent old fractures. No pneumothorax. 4.  Hiatal hernia again noted. Electronically Signed   By: Maisie Fus  Register   On: 06/05/2020 14:58   DG Abd 1 View  Result Date: 06/06/2020 CLINICAL DATA:  Nausea and vomiting. EXAM: ABDOMEN - 1 VIEW COMPARISON:  CT 06/05/2020. FINDINGS: Surgical clips lower chest/upper abdomen. Right base pleural-parenchymal thickening most consistent scarring. Adjacent right lower old rib fracture noted. Single loop of slightly dilated small bowel noted in the left upper quadrant. This is nonspecific finding. Follow-up exam should be considered to exclude developing small bowel distention. Colon is nondistended. No free air. Aortoiliac atherosclerotic vascular calcification. Pelvic calcifications consistent phleboliths. Degenerative change thoracolumbar spine and both hips. IMPRESSION: Single loop of slightly dilated small-bowel noted in the left upper quadrant. This is a nonspecific finding. Follow-up exam should be considered to exclude developing small bowel distention. Colon is nondistended. No free air. Electronically Signed   By: Maisie Fus  Register   On: 06/06/2020 07:59   CT CHEST ABDOMEN PELVIS W CONTRAST  Result Date: 06/05/2020 CLINICAL DATA:  Recent bowel surgery. Chest pain, shortness of breath. EXAM: CT CHEST, ABDOMEN, AND PELVIS WITH CONTRAST TECHNIQUE: Multidetector CT imaging of the chest, abdomen and pelvis was performed following the standard protocol during bolus administration of intravenous contrast. CONTRAST:  OMNIPAQUE IOHEXOL 300 MG/ML   SOLN COMPARISON:  None. FINDINGS: CT CHEST FINDINGS Cardiovascular: Tortuous aorta. Heart is normal size. Aorta is normal caliber. Scattered aortic and coronary artery calcifications. Mediastinum/Nodes: No mediastinal, hilar, or axillary adenopathy. Trachea unremarkable. Prior left thyroidectomy. Changes of gastric pull-through. Esophagus is fluid-filled as is the gastric pull-through. Lungs/Pleura: Atelectasis in the right lower lobe adjacent to the gastric pull-through. Otherwise no confluent opacities. No effusions. Musculoskeletal: Chest wall soft tissues are unremarkable. No acute bony abnormality. Postoperative changes in the posterior right lower ribs/chest wall. CT ABDOMEN PELVIS FINDINGS Hepatobiliary: Diffuse low-density throughout the liver compatible with fatty infiltration. No focal abnormality. Gallbladder unremarkable. Pancreas: No focal abnormality or ductal dilatation. Spleen: No focal abnormality.  Normal size. 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 Right kidney unremarkable. No adrenal or renal mass. No hydronephrosis. Gas seen within the urinary bladder, presumably from recent catheterization. Stomach/Bowel: Sigmoid diverticulosis. There is stranding around the sigmoid colon which could reflect active diverticulitis. Small bowel decompressed. No bowel obstruction. Vascular/Lymphatic: Aortic atherosclerosis. No evidence of aneurysm or adenopathy.  Reproductive: No visible focal abnormality. Other: No free fluid or free air. Musculoskeletal: No acute bony abnormality. IMPRESSION: Postoperative changes in the chest and upper abdomen from what appears to be gastric pull-through procedure. The stomach and esophagus are fluid-filled. Right base atelectasis. Scattered coronary artery and aortic calcifications. Hepatic steatosis. Left colonic diverticulosis. Stranding around the sigmoid colon may reflect early active diverticulitis. Electronically Signed    By: Charlett Nose M.D.   On: 06/05/2020 18:52   DG CHEST PORT 1 VIEW  Result Date: 06/07/2020 CLINICAL DATA:  Tachypnea EXAM: PORTABLE CHEST 1 VIEW COMPARISON:  06/05/2020 FINDINGS: Heart size and pulmonary vascularity are normal for technique. Increased density along the right lower chest with pleural thickening likely related to postoperative changes from previous gastric pull-through. Right rib deformities are also likely postoperative. No developing consolidation or edema in the lungs. Degenerative changes in the spine and shoulders. IMPRESSION: Postoperative changes in the right chest. No evidence of active pulmonary disease. Electronically Signed   By: Burman Nieves M.D.   On: 06/07/2020 00:22     TODAY-DAY OF DISCHARGE:  Subjective:   Sachit Gilman today has no headache,no chest abdominal pain,no new weakness tingling or numbness, feels much better wants to go home today.   Objective:   Blood pressure 122/74, pulse 78, temperature 98 F (36.7 C), temperature source Axillary, resp. rate 20, height 5\' 4"  (1.626 m), weight 61.4 kg, SpO2 98 %.  Intake/Output Summary (Last 24 hours) at 06/09/2020 0917 Last data filed at 06/09/2020 0853 Gross per 24 hour  Intake 565.02 ml  Output 2150 ml  Net -1584.98 ml   Filed Weights   06/05/20 1328 06/09/20 0400  Weight: 61.2 kg 61.4 kg    Exam: Awake Alert, Oriented *3, No new F.N deficits, Normal affect [place].AT,PERRAL Supple Neck,No JVD, No cervical lymphadenopathy appriciated.  Symmetrical Chest wall movement, Good air movement bilaterally, CTAB RRR,No Gallops,Rubs or new Murmurs, No Parasternal Heave +ve B.Sounds, Abd Soft, Non tender, No organomegaly appriciated, No rebound -guarding or rigidity. No Cyanosis, Clubbing or edema, No new Rash or bruise   PERTINENT RADIOLOGIC STUDIES: No results found.   PERTINENT LAB RESULTS: CBC: Recent Labs    06/08/20 0222 06/09/20 0051  WBC 12.0* 10.4  HGB 10.8* 10.0*  HCT 32.3* 30.6*   PLT 279 277   CMET CMP     Component Value Date/Time   NA 135 06/09/2020 0051   K 4.2 06/09/2020 0051   CL 104 06/09/2020 0051   CO2 25 06/09/2020 0051   GLUCOSE 122 (H) 06/09/2020 0051   BUN <5 (L) 06/09/2020 0051   CREATININE 1.16 06/09/2020 0051   CALCIUM 7.9 (L) 06/09/2020 0051   PROT 6.8 06/06/2020 0816   ALBUMIN 2.9 (L) 06/06/2020 0816   AST 19 06/06/2020 0816   ALT 15 06/06/2020 0816   ALKPHOS 83 06/06/2020 0816   BILITOT 1.0 06/06/2020 0816   GFRNONAA >60 06/09/2020 0051    GFR Estimated Creatinine Clearance: 58.1 mL/min (by C-G formula based on SCr of 1.16 mg/dL). No results for input(s): LIPASE, AMYLASE in the last 72 hours. No results for input(s): CKTOTAL, CKMB, CKMBINDEX, TROPONINI in the last 72 hours. Invalid input(s): POCBNP No results for input(s): DDIMER in the last 72 hours. No results for input(s): HGBA1C in the last 72 hours. No results for input(s): CHOL, HDL, LDLCALC, TRIG, CHOLHDL, LDLDIRECT in the last 72 hours. No results for input(s): TSH, T4TOTAL, T3FREE, THYROIDAB in the last 72 hours.  Invalid input(s): FREET3 No results  for input(s): VITAMINB12, FOLATE, FERRITIN, TIBC, IRON, RETICCTPCT in the last 72 hours. Coags: No results for input(s): INR in the last 72 hours.  Invalid input(s): PT Microbiology: Recent Results (from the past 240 hour(s))  Urine culture     Status: Abnormal   Collection Time: 06/05/20  3:39 PM   Specimen: Urine, Random  Result Value Ref Range Status   Specimen Description URINE, RANDOM  Final   Special Requests   Final    NONE Performed at [facility], [address] [place], [place] [address]    Culture MULTIPLE SPECIES PRESENT, SUGGEST RECOLLECTION (A)  Final   Report Status 06/06/2020 FINAL  Final  SARS CORONAVIRUS 2 (TAT 6-24 HRS) Nasopharyngeal Nasopharyngeal Swab     Status: None   Collection Time: 06/05/20  7:30 PM   Specimen: Nasopharyngeal Swab  Result Value Ref Range Status   SARS  Coronavirus 2 NEGATIVE NEGATIVE Final    Comment: (NOTE) SARS-CoV-2 target nucleic acids are NOT DETECTED.  The SARS-CoV-2 RNA is generally detectable in upper and lower respiratory specimens during the acute phase of infection. Negative results do not preclude SARS-CoV-2 infection, do not rule out co-infections with other pathogens, and should not be used as the sole basis for treatment or other patient management decisions. Negative results must be combined with clinical observations, patient history, and epidemiological information. The expected result is Negative.  Fact Sheet for Patients: HairSlick.no  Fact Sheet for Healthcare Providers: quierodirigir.com  This test is not yet approved or cleared by the [place] FDA and  has been authorized for detection and/or diagnosis of SARS-CoV-2 by FDA under an Emergency Use Authorization (EUA). This EUA will remain  in effect (meaning this test can be used) for the duration of the COVID-19 declaration under Se ction 564(b)(1) of the Act, 21 U.S.C. section 360bbb-3(b)(1), unless the authorization is terminated or revoked sooner.  Performed at [facility], [address], [place], [place] [address]   Culture, blood (routine x 2)     Status: None (Preliminary result)   Collection Time: 06/05/20  9:15 PM   Specimen: BLOOD LEFT FOREARM  Result Value Ref Range Status   Specimen Description BLOOD LEFT FOREARM  Final   Special Requests   Final    BOTTLES DRAWN AEROBIC AND ANAEROBIC Blood Culture results may not be optimal due to an inadequate volume of blood received in culture bottles   Culture   Final    NO GROWTH 3 DAYS Performed at [facility], [address] [place], [place] [address]    Report Status PENDING  Incomplete  Culture, blood (routine x 2)     Status: None (Preliminary result)   Collection Time: 06/05/20  9:47 PM   Specimen: BLOOD LEFT ARM   Result Value Ref Range Status   Specimen Description BLOOD LEFT ARM  Final   Special Requests   Final    BOTTLES DRAWN AEROBIC AND ANAEROBIC Blood Culture adequate volume   Culture   Final    NO GROWTH 3 DAYS Performed at [facility], [address], [place], [place] [address]    Report Status PENDING  Incomplete  C Difficile Quick Screen w PCR reflex     Status: Abnormal   Collection Time: 06/07/20  4:34 PM   Specimen: STOOL  Result Value Ref Range Status   C Diff antigen POSITIVE (A) NEGATIVE Final   C Diff toxin NEGATIVE NEGATIVE Final   C Diff  interpretation Results are indeterminate. See PCR results.  Final    Comment: Performed at [facility], [address], [place], [place] [address]  C. Diff by PCR, Reflexed     Status: Abnormal   Collection Time: 06/07/20  4:34 PM  Result Value Ref Range Status   Toxigenic C. Difficile by PCR POSITIVE (A) NEGATIVE Final    Comment: Positive for toxigenic C. difficile with little to no toxin production. Only treat if clinical presentation suggests symptomatic illness. Performed at [facility], [address], [place], [place] [address]     FURTHER DISCHARGE INSTRUCTIONS:  Get Medicines reviewed and adjusted: Please take all your medications with you for your next visit with your Primary MD  Laboratory/radiological data: Please request your Primary MD to go over all hospital tests and procedure/radiological results at the follow up, please ask your Primary MD to get all Hospital records sent to his/her office.  In some cases, they will be blood work, cultures and biopsy results pending at the time of your discharge. Please request that your primary care M.D. goes through all the records of your hospital data and follows up on these results.  Also Note the following: If you experience worsening of your admission symptoms, develop shortness of breath, life threatening emergency, suicidal or homicidal thoughts you must  seek medical attention immediately by calling 911 or calling your MD immediately  if symptoms less severe.  You must read complete instructions/literature along with all the possible adverse reactions/side effects for all the Medicines you take and that have been prescribed to you. Take any new Medicines after you have completely understood and accpet all the possible adverse reactions/side effects.   Do not drive when taking Pain medications or sleeping medications (Benzodaizepines)  Do not take more than prescribed Pain, Sleep and Anxiety Medications. It is not advisable to combine anxiety,sleep and pain medications without talking with your primary care practitioner  Special Instructions: If you have smoked or chewed Tobacco  in the last 2 yrs please stop smoking, stop any regular Alcohol  and or any Recreational drug use.  Wear Seat belts while driving.  Please note: You were cared for by a hospitalist during your hospital stay. Once you are discharged, your primary care physician will handle any further medical issues. Please note that NO REFILLS for any discharge medications will be authorized once you are discharged, as it is imperative that you return to your primary care physician (or establish a relationship with a primary care physician if you do not have one) for your post hospital discharge needs so that they can reassess your need for medications and monitor your lab values.  Total Time spent coordinating discharge including counseling, education and face to face time equals 35 minutes.  SignedJeoffrey Massed 06/09/2020 9:17 AM

## 2020-06-09 NOTE — Progress Notes (Signed)
Patient discharged home.  Discharge instructions explained, patient verbalize understanding.  Scrip and medication from [facility] pharmacy given to patient.

## 2020-06-10 LAB — CULTURE, BLOOD (ROUTINE X 2)
Culture: NO GROWTH
Culture: NO GROWTH
Special Requests: ADEQUATE

## 2020-07-21 ENCOUNTER — Emergency Department (HOSPITAL_COMMUNITY): Payer: Medicare (Managed Care)

## 2020-07-21 ENCOUNTER — Inpatient Hospital Stay (HOSPITAL_COMMUNITY)
Admission: EM | Admit: 2020-07-21 | Discharge: 2020-07-28 | DRG: 551 | Disposition: A | Discharge status: Home / Self Care | Payer: Medicare (Managed Care) | Attending: Family Medicine | Admitting: Family Medicine

## 2020-07-21 ENCOUNTER — Other Ambulatory Visit: Payer: Self-pay

## 2020-07-21 ENCOUNTER — Encounter (HOSPITAL_COMMUNITY): Payer: Self-pay | Admitting: Emergency Medicine

## 2020-07-21 DIAGNOSIS — R197 Diarrhea, unspecified: Secondary | ICD-10-CM | POA: Diagnosis present

## 2020-07-21 DIAGNOSIS — Y95 Nosocomial condition: Secondary | ICD-10-CM | POA: Diagnosis present

## 2020-07-21 DIAGNOSIS — Z79899 Other long term (current) drug therapy: Secondary | ICD-10-CM | POA: Diagnosis not present

## 2020-07-21 DIAGNOSIS — E43 Unspecified severe protein-calorie malnutrition: Secondary | ICD-10-CM | POA: Diagnosis present

## 2020-07-21 DIAGNOSIS — K2100 Gastro-esophageal reflux disease with esophagitis, without bleeding: Secondary | ICD-10-CM | POA: Diagnosis present

## 2020-07-21 DIAGNOSIS — I129 Hypertensive chronic kidney disease with stage 1 through stage 4 chronic kidney disease, or unspecified chronic kidney disease: Secondary | ICD-10-CM | POA: Diagnosis present

## 2020-07-21 DIAGNOSIS — F1011 Alcohol abuse, in remission: Secondary | ICD-10-CM | POA: Diagnosis not present

## 2020-07-21 DIAGNOSIS — K760 Fatty (change of) liver, not elsewhere classified: Secondary | ICD-10-CM | POA: Diagnosis present

## 2020-07-21 DIAGNOSIS — Z880 Allergy status to penicillin: Secondary | ICD-10-CM | POA: Diagnosis not present

## 2020-07-21 DIAGNOSIS — M5136 Other intervertebral disc degeneration, lumbar region: Principal | ICD-10-CM | POA: Diagnosis present

## 2020-07-21 DIAGNOSIS — A419 Sepsis, unspecified organism: Secondary | ICD-10-CM | POA: Diagnosis not present

## 2020-07-21 DIAGNOSIS — F419 Anxiety disorder, unspecified: Secondary | ICD-10-CM | POA: Diagnosis present

## 2020-07-21 DIAGNOSIS — Z87891 Personal history of nicotine dependence: Secondary | ICD-10-CM | POA: Diagnosis not present

## 2020-07-21 DIAGNOSIS — I499 Cardiac arrhythmia, unspecified: Secondary | ICD-10-CM | POA: Diagnosis present

## 2020-07-21 DIAGNOSIS — I4891 Unspecified atrial fibrillation: Secondary | ICD-10-CM | POA: Diagnosis present

## 2020-07-21 DIAGNOSIS — K5732 Diverticulitis of large intestine without perforation or abscess without bleeding: Secondary | ICD-10-CM | POA: Diagnosis not present

## 2020-07-21 DIAGNOSIS — Z681 Body mass index (BMI) 19 or less, adult: Secondary | ICD-10-CM | POA: Diagnosis not present

## 2020-07-21 DIAGNOSIS — R110 Nausea: Secondary | ICD-10-CM | POA: Diagnosis not present

## 2020-07-21 DIAGNOSIS — M5134 Other intervertebral disc degeneration, thoracic region: Secondary | ICD-10-CM | POA: Diagnosis present

## 2020-07-21 DIAGNOSIS — N179 Acute kidney failure, unspecified: Secondary | ICD-10-CM | POA: Diagnosis present

## 2020-07-21 DIAGNOSIS — G8929 Other chronic pain: Secondary | ICD-10-CM | POA: Diagnosis present

## 2020-07-21 DIAGNOSIS — D631 Anemia in chronic kidney disease: Secondary | ICD-10-CM | POA: Diagnosis present

## 2020-07-21 DIAGNOSIS — I10 Essential (primary) hypertension: Secondary | ICD-10-CM

## 2020-07-21 DIAGNOSIS — N182 Chronic kidney disease, stage 2 (mild): Secondary | ICD-10-CM | POA: Diagnosis present

## 2020-07-21 DIAGNOSIS — Z20822 Contact with and (suspected) exposure to covid-19: Secondary | ICD-10-CM | POA: Diagnosis present

## 2020-07-21 DIAGNOSIS — Q600 Renal agenesis, unilateral: Secondary | ICD-10-CM | POA: Diagnosis not present

## 2020-07-21 DIAGNOSIS — D509 Iron deficiency anemia, unspecified: Secondary | ICD-10-CM | POA: Diagnosis present

## 2020-07-21 DIAGNOSIS — N289 Disorder of kidney and ureter, unspecified: Secondary | ICD-10-CM | POA: Diagnosis not present

## 2020-07-21 DIAGNOSIS — J4520 Mild intermittent asthma, uncomplicated: Secondary | ICD-10-CM | POA: Diagnosis present

## 2020-07-21 DIAGNOSIS — R112 Nausea with vomiting, unspecified: Secondary | ICD-10-CM | POA: Diagnosis not present

## 2020-07-21 DIAGNOSIS — Z7951 Long term (current) use of inhaled steroids: Secondary | ICD-10-CM | POA: Diagnosis not present

## 2020-07-21 DIAGNOSIS — F10239 Alcohol dependence with withdrawal, unspecified: Secondary | ICD-10-CM | POA: Diagnosis not present

## 2020-07-21 DIAGNOSIS — J189 Pneumonia, unspecified organism: Secondary | ICD-10-CM | POA: Diagnosis not present

## 2020-07-21 DIAGNOSIS — R1032 Left lower quadrant pain: Secondary | ICD-10-CM | POA: Diagnosis not present

## 2020-07-21 DIAGNOSIS — Z809 Family history of malignant neoplasm, unspecified: Secondary | ICD-10-CM

## 2020-07-21 DIAGNOSIS — M546 Pain in thoracic spine: Secondary | ICD-10-CM | POA: Diagnosis not present

## 2020-07-21 LAB — I-STAT CHEM 8, ED
BUN: 10 mg/dL (ref 6–20)
Calcium, Ion: 1.18 mmol/L (ref 1.15–1.40)
Chloride: 104 mmol/L (ref 98–111)
Creatinine, Ser: 1.2 mg/dL (ref 0.61–1.24)
Glucose, Bld: 117 mg/dL — ABNORMAL HIGH (ref 70–99)
HCT: 35 % — ABNORMAL LOW (ref 39.0–52.0)
Hemoglobin: 11.9 g/dL — ABNORMAL LOW (ref 13.0–17.0)
Potassium: 3.6 mmol/L (ref 3.5–5.1)
Sodium: 140 mmol/L (ref 135–145)
TCO2: 22 mmol/L (ref 22–32)

## 2020-07-21 LAB — RESP PANEL BY RT-PCR (FLU A&B, COVID) ARPGX2
Influenza A by PCR: NEGATIVE
Influenza B by PCR: NEGATIVE
SARS Coronavirus 2 by RT PCR: NEGATIVE

## 2020-07-21 LAB — COMPREHENSIVE METABOLIC PANEL
ALT: 23 U/L (ref 0–44)
AST: 35 U/L (ref 15–41)
Albumin: 3.5 g/dL (ref 3.5–5.0)
Alkaline Phosphatase: 90 U/L (ref 38–126)
Anion gap: 14 (ref 5–15)
BUN: 10 mg/dL (ref 6–20)
CO2: 19 mmol/L — ABNORMAL LOW (ref 22–32)
Calcium: 9.3 mg/dL (ref 8.9–10.3)
Chloride: 104 mmol/L (ref 98–111)
Creatinine, Ser: 1.36 mg/dL — ABNORMAL HIGH (ref 0.61–1.24)
GFR, Estimated: >60 mL/min (ref >60)
Glucose, Bld: 127 mg/dL — ABNORMAL HIGH (ref 70–99)
Potassium: 3.6 mmol/L (ref 3.5–5.1)
Sodium: 137 mmol/L (ref 135–145)
Total Bilirubin: 1.1 mg/dL (ref 0.3–1.2)
Total Protein: 7.2 g/dL (ref 6.5–8.1)

## 2020-07-21 LAB — PROTIME-INR
INR: 1.2 (ref 0.8–1.2)
Prothrombin Time: 14.7 seconds (ref 11.4–15.2)

## 2020-07-21 LAB — URINALYSIS, ROUTINE W REFLEX MICROSCOPIC
Bacteria, UA: NONE SEEN
Bilirubin Urine: NEGATIVE
Glucose, UA: NEGATIVE mg/dL
Hgb urine dipstick: NEGATIVE
Ketones, ur: NEGATIVE mg/dL
Nitrite: NEGATIVE
Protein, ur: NEGATIVE mg/dL
Specific Gravity, Urine: 1.01 (ref 1.005–1.030)
pH: 6 (ref 5.0–8.0)

## 2020-07-21 LAB — CBC WITH DIFFERENTIAL/PLATELET
Abs Immature Granulocytes: 0.49 10*3/uL — ABNORMAL HIGH (ref 0.00–0.07)
Basophils Absolute: 0.1 10*3/uL (ref 0.0–0.1)
Basophils Relative: 0 %
Eosinophils Absolute: 0 10*3/uL (ref 0.0–0.5)
Eosinophils Relative: 0 %
HCT: 35.8 % — ABNORMAL LOW (ref 39.0–52.0)
Hemoglobin: 11.6 g/dL — ABNORMAL LOW (ref 13.0–17.0)
Immature Granulocytes: 2 %
Lymphocytes Relative: 2 %
Lymphs Abs: 0.5 10*3/uL — ABNORMAL LOW (ref 0.7–4.0)
MCH: 29 pg (ref 26.0–34.0)
MCHC: 32.4 g/dL (ref 30.0–36.0)
MCV: 89.5 fL (ref 80.0–100.0)
Monocytes Absolute: 2.1 10*3/uL — ABNORMAL HIGH (ref 0.1–1.0)
Monocytes Relative: 7 %
Neutro Abs: 24.9 10*3/uL — ABNORMAL HIGH (ref 1.7–7.7)
Neutrophils Relative %: 89 %
Platelets: 279 10*3/uL (ref 150–400)
RBC: 4 MIL/uL — ABNORMAL LOW (ref 4.22–5.81)
RDW: 14.1 % (ref 11.5–15.5)
WBC: 28 10*3/uL — ABNORMAL HIGH (ref 4.0–10.5)
nRBC: 0 % (ref 0.0–0.2)

## 2020-07-21 LAB — LACTIC ACID, PLASMA
Lactic Acid, Venous: 6.2 mmol/L (ref 0.5–1.9)
Lactic Acid, Venous: 5.5 mmol/L (ref 0.5–1.9)

## 2020-07-21 LAB — TROPONIN I (HIGH SENSITIVITY)
Troponin I (High Sensitivity): 15 ng/L (ref <18)
Troponin I (High Sensitivity): 17 ng/L (ref <18)

## 2020-07-21 LAB — LIPASE, BLOOD: Lipase: 24 U/L (ref 11–51)

## 2020-07-21 LAB — APTT: aPTT: 27 seconds (ref 24–36)

## 2020-07-21 LAB — PROCALCITONIN: Procalcitonin: 12.14 ng/mL

## 2020-07-21 IMAGING — DX DG CHEST 1V PORT
1 series · 1 of 1 positions shown · non-contrast
Comparison: Prior studies from [DATE] both CT and chest
x-ray.

CLINICAL DATA: Shortness of breath and chest pain in a 60-year-old
male.

EXAM:
PORTABLE CHEST 1 VIEW

[chest]
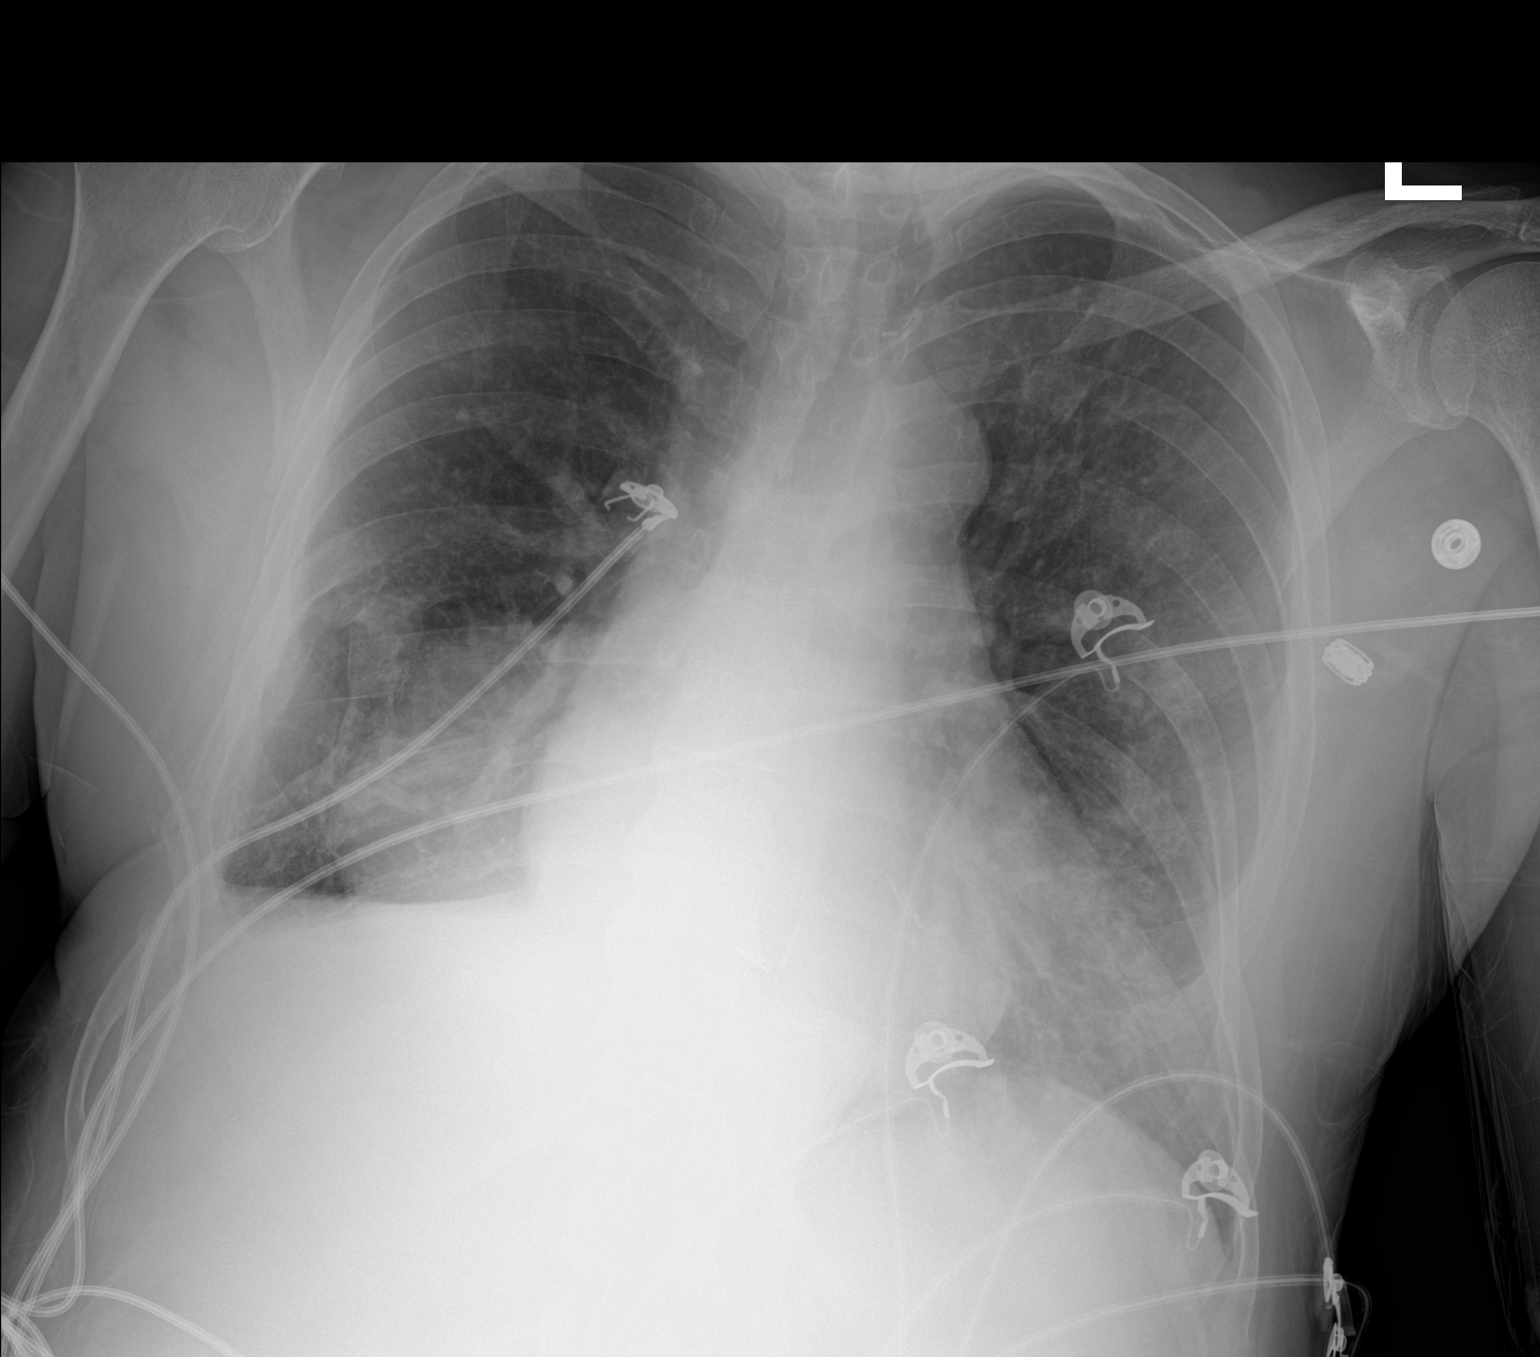

[1 of 1 positions shown; findings below may reference images not displayed]

FINDINGS: Image rotated to the LEFT. Accounting for this cardiomediastinal
contours are stable.

Signs of esophagectomy and gastric pull-through, intrathoracic
stomach projects into the LEFT and RIGHT retrocardiac and juxta
cardiac regions.

Mildly increased interstitial markings with suspected patchy
opacities in the LEFT mid chest. No signs of lobar consolidation. No
visible pleural effusion.

Signs of RIGHT thoracotomy again noted.
IMPRESSION: 1. Mildly increased interstitial markings with suspected patchy
opacities in the LEFT mid chest. Findings could reflect developing
pneumonitis/infection.
2. Signs of esophagectomy and gastric pull-through.

## 2020-07-21 IMAGING — CT CT RENAL STONE PROTOCOL
2 of 5 series · 16 of 46 positions shown, 18 images · non-contrast
Comparison: [DATE]

CLINICAL DATA: Flank pain.

EXAM:
CT ABDOMEN AND PELVIS WITHOUT CONTRAST
TECHNIQUE: Multidetector CT imaging of the abdomen and pelvis was performed
following the standard protocol without IV contrast.

[Series 3: ap without · axial · non-contrast · 0.65mm/px · z∈[+790,+1165]mm · 13 of 89 slices shown, 15 images]
[im 7/89  soft-tissue]
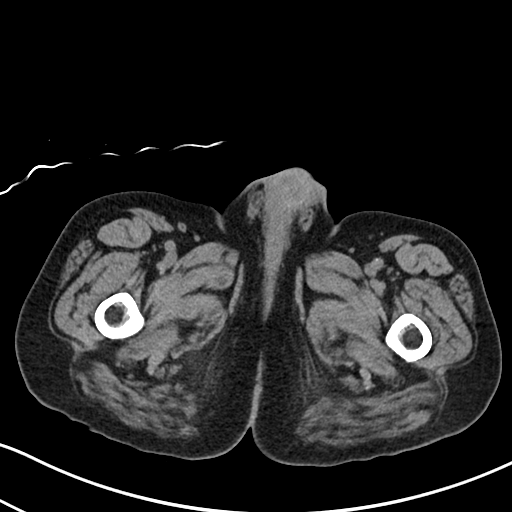
[im 7/89  bone]
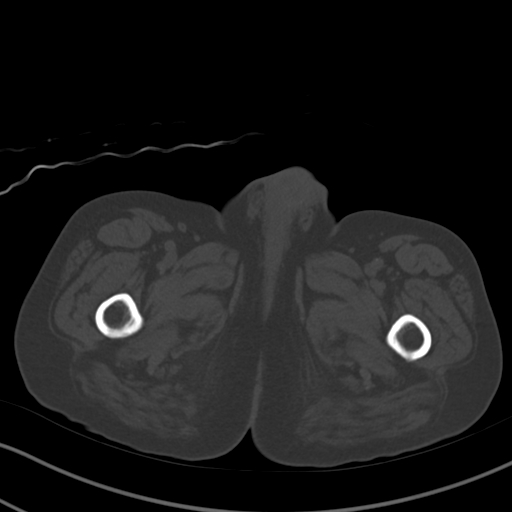
[im 13/89  soft-tissue]
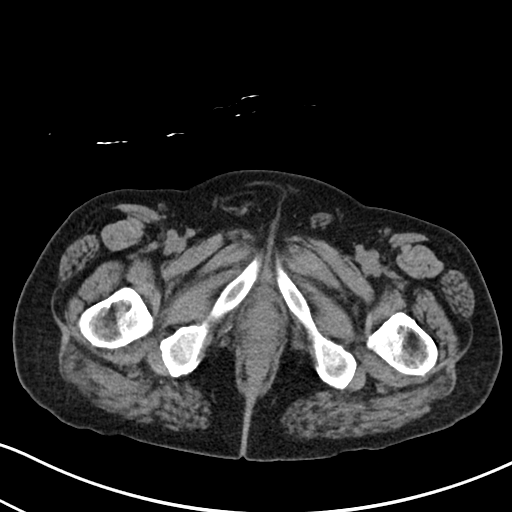
[im 19/89  soft-tissue]
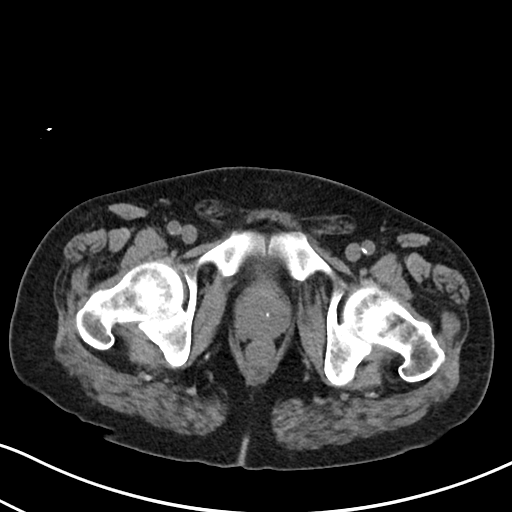
[im 26/89  soft-tissue]
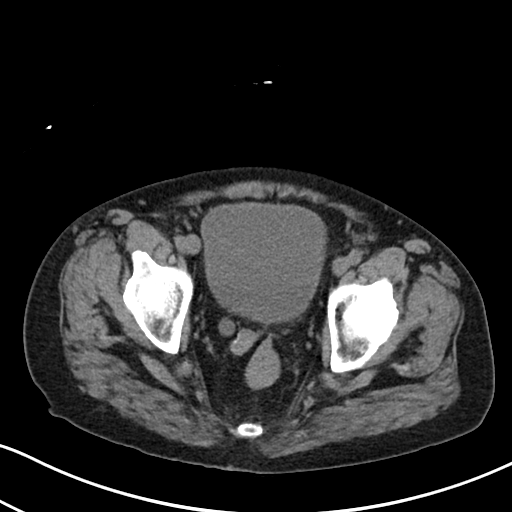
[im 32/89  soft-tissue]
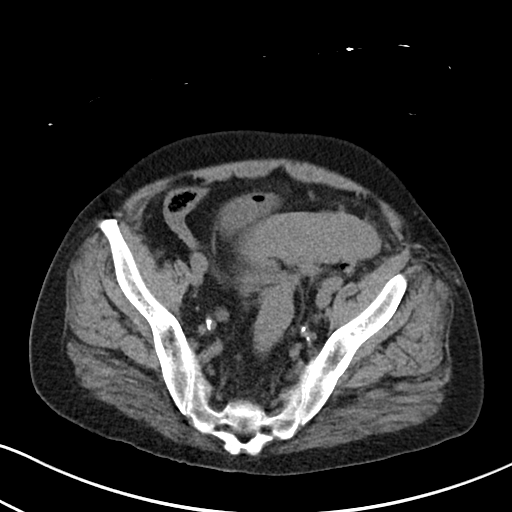
[im 38/89  soft-tissue]
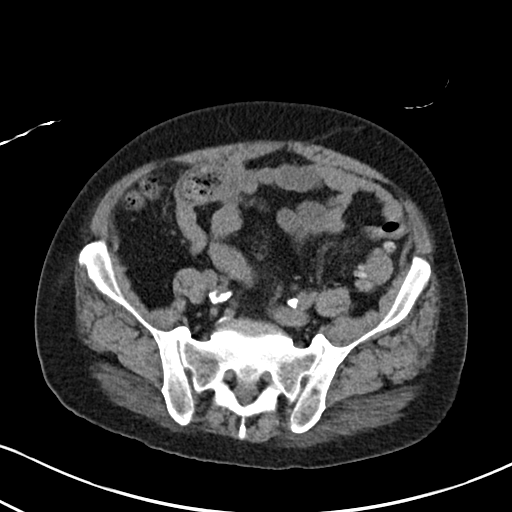
[im 45/89  soft-tissue]
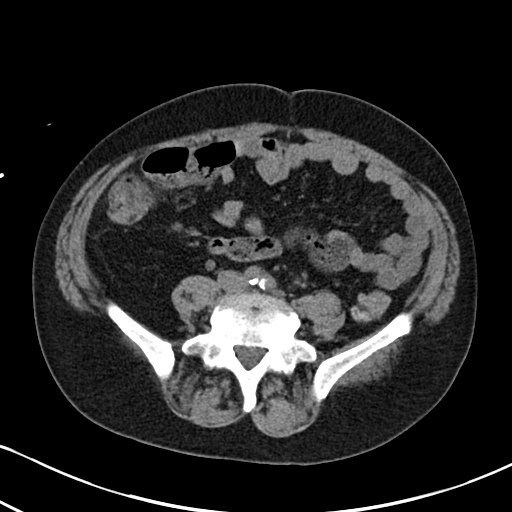
[im 51/89  soft-tissue]
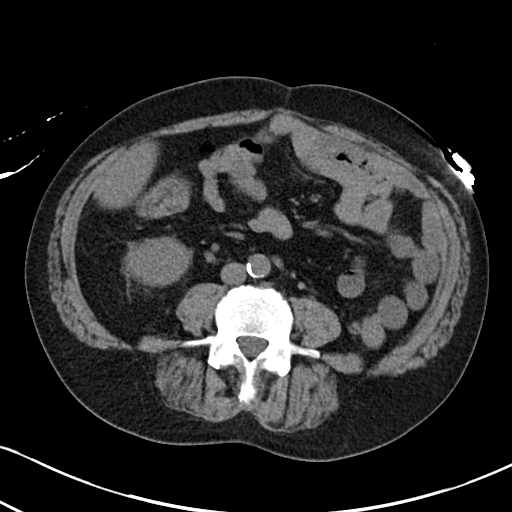
[im 57/89  soft-tissue]
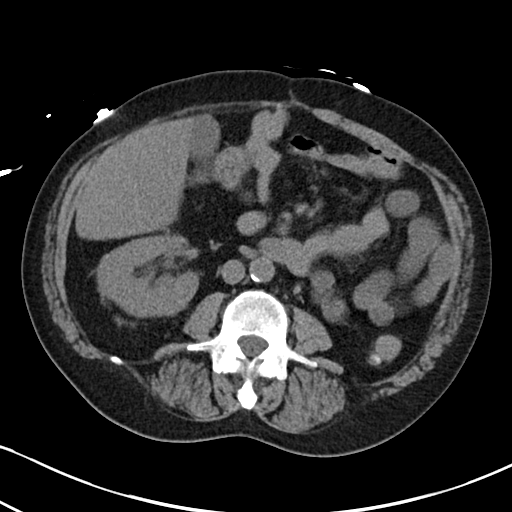
[im 57/89  bone]
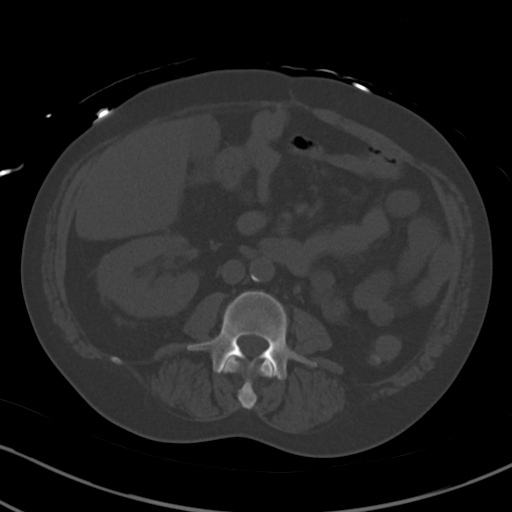
[im 63/89  soft-tissue]
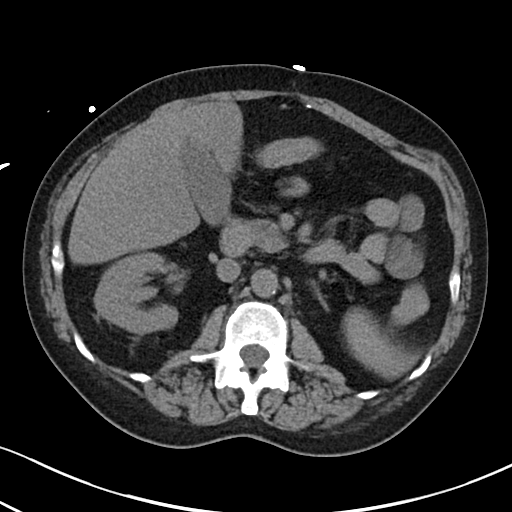
[im 70/89  soft-tissue]
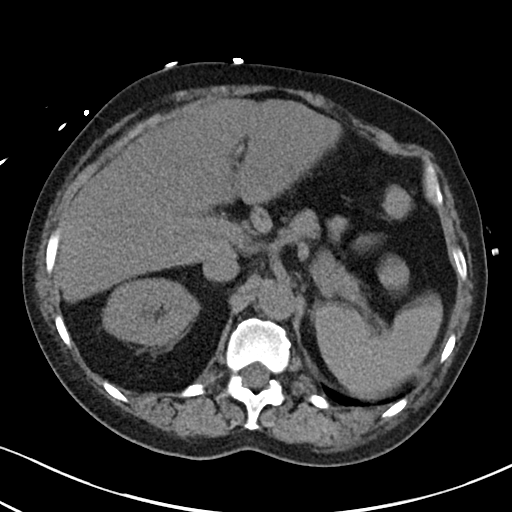
[im 76/89  soft-tissue]
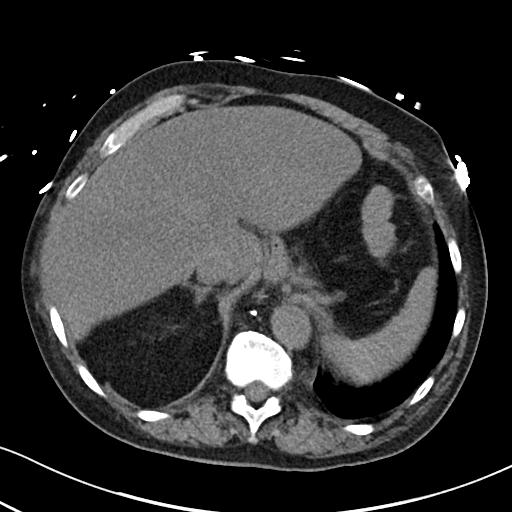
[im 82/89  soft-tissue]
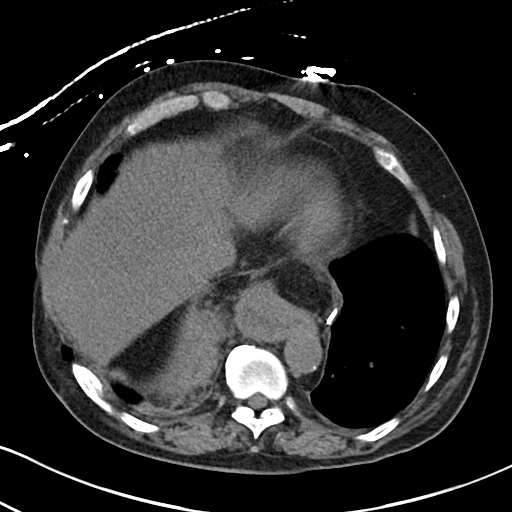

[Series 6: cor · coronal · 0.62mm/px · 3 of 93 slices shown]
[im 31/93  soft-tissue]
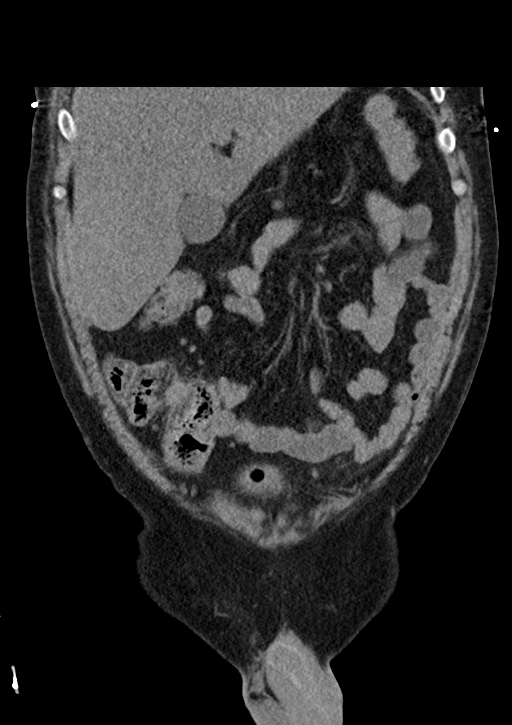
[im 41/93  soft-tissue]
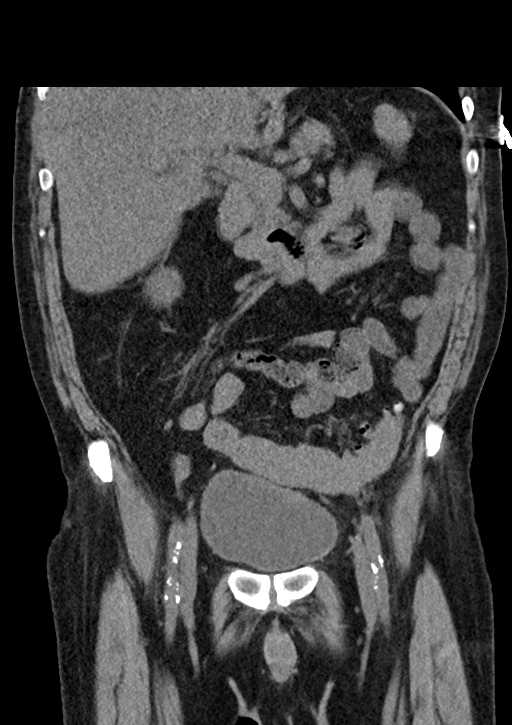
[im 52/93  soft-tissue]
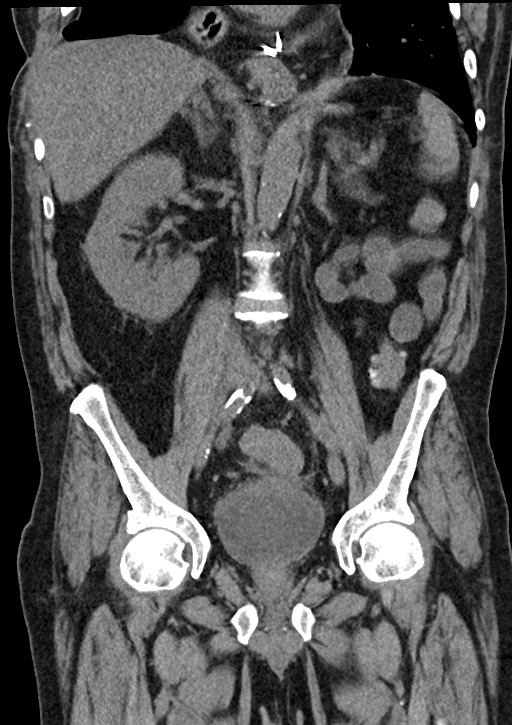

[16 of 46 positions shown; findings below may reference images not displayed]

FINDINGS: Lower chest: Prior gastric pull-through surgery is seen with
postoperative changes noted along the posteromedial aspect of the
right lung base.

Mild areas of bibasilar atelectasis and/or infiltrate are also
noted.

Hepatobiliary: No focal liver abnormality is seen. No gallstones,
gallbladder wall thickening, or biliary dilatation.

Pancreas: Unremarkable. No pancreatic ductal dilatation or
surrounding inflammatory changes.

Spleen: Normal in size without focal abnormality.

Adrenals/Urinary Tract: Adrenal glands are unremarkable. The left
kidney is absent. The right kidney is normal in size, without renal
calculi, focal lesion, or hydronephrosis. Bladder is unremarkable.

Stomach/Bowel: Stomach is within normal limits. Appendix appears
normal. No evidence of bowel dilatation. Numerous diverticula are
seen within the descending and sigmoid colon. Moderate severity
thickening of the proximal to mid sigmoid colon is also seen with a
mild amount of stable inflammatory fat stranding seen adjacent to
the posterior aspect of the mid sigmoid colon (axial CT images 54
through 59, CT series number 3).

Vascular/Lymphatic: Aortic atherosclerosis. No enlarged abdominal or
pelvic lymph nodes.

Reproductive: Prostate is unremarkable.

Other: No abdominal wall hernia or abnormality. No abdominopelvic
ascites.

Musculoskeletal: No acute or significant osseous findings.
IMPRESSION: 1. Findings consistent with colitis and/or diverticulitis involving
the proximal to mid sigmoid colon.
2. Evidence of prior gastric pull-through and suspected left
nephrectomy.

## 2020-07-21 MED ORDER — METOPROLOL TARTRATE 50 MG PO TABS
50.0000 mg | ORAL_TABLET | Freq: Two times a day (BID) | ORAL | Status: DC
  Administered 2020-07-22 (×2): 50 mg via ORAL
  Filled 2020-07-21 (×2): qty 2

## 2020-07-21 MED ORDER — SODIUM CHLORIDE 0.9 % IV SOLN
1.0000 g | Freq: Once | INTRAVENOUS | Status: AC
  Administered 2020-07-21: 1 g via INTRAVENOUS
  Filled 2020-07-21: qty 10

## 2020-07-21 MED ORDER — ACETAMINOPHEN 650 MG RE SUPP: 650.0000 mg | Freq: Four times a day (QID) | RECTAL | Status: DC | PRN

## 2020-07-21 MED ORDER — ONDANSETRON HCL 4 MG PO TABS: 4.0000 mg | ORAL_TABLET | Freq: Four times a day (QID) | ORAL | Status: DC | PRN

## 2020-07-21 MED ORDER — ONDANSETRON HCL 4 MG/2ML IJ SOLN
4.0000 mg | Freq: Once | INTRAMUSCULAR | Status: AC
  Administered 2020-07-21: 4 mg via INTRAVENOUS
  Filled 2020-07-21: qty 2

## 2020-07-21 MED ORDER — SODIUM CHLORIDE 0.9 % IV BOLUS
1000.0000 mL | Freq: Once | INTRAVENOUS | Status: AC
  Administered 2020-07-21: 1000 mL via INTRAVENOUS

## 2020-07-21 MED ORDER — THIAMINE HCL 100 MG/ML IJ SOLN
100.0000 mg | Freq: Every day | INTRAMUSCULAR | Status: DC
  Administered 2020-07-21 – 2020-07-22 (×2): 100 mg via INTRAVENOUS
  Filled 2020-07-21 (×2): qty 2

## 2020-07-21 MED ORDER — ONDANSETRON HCL 4 MG/2ML IJ SOLN
4.0000 mg | Freq: Four times a day (QID) | INTRAMUSCULAR | Status: DC | PRN
  Administered 2020-07-22 – 2020-07-27 (×6): 4 mg via INTRAVENOUS
  Filled 2020-07-21 (×6): qty 2

## 2020-07-21 MED ORDER — LACTATED RINGERS IV SOLN: INTRAVENOUS | Status: AC

## 2020-07-21 MED ORDER — FOLIC ACID 1 MG PO TABS
1.0000 mg | ORAL_TABLET | Freq: Every day | ORAL | Status: DC
  Administered 2020-07-21 – 2020-07-28 (×8): 1 mg via ORAL
  Filled 2020-07-21 (×8): qty 1

## 2020-07-21 MED ORDER — DILTIAZEM HCL 25 MG/5ML IV SOLN
10.0000 mg | Freq: Once | INTRAVENOUS | Status: AC
  Administered 2020-07-21: 10 mg via INTRAVENOUS
  Filled 2020-07-21: qty 5

## 2020-07-21 MED ORDER — PANTOPRAZOLE SODIUM 40 MG PO TBEC
40.0000 mg | DELAYED_RELEASE_TABLET | Freq: Every day | ORAL | Status: DC
  Administered 2020-07-22 – 2020-07-28 (×7): 40 mg via ORAL
  Filled 2020-07-21 (×7): qty 1

## 2020-07-21 MED ORDER — LORAZEPAM 2 MG/ML IJ SOLN
0.0000 mg | Freq: Three times a day (TID) | INTRAMUSCULAR | Status: AC
Start: 2020-07-23 — End: 2020-07-25
  Administered 2020-07-25 (×2): 2 mg via INTRAVENOUS
  Filled 2020-07-21 (×2): qty 1

## 2020-07-21 MED ORDER — FLUTICASONE-UMECLIDIN-VILANT 100-62.5-25 MCG/INH IN AEPB: 1.0000 | INHALATION_SPRAY | Freq: Every day | RESPIRATORY_TRACT | Status: DC

## 2020-07-21 MED ORDER — FLUTICASONE FUROATE-VILANTEROL 100-25 MCG/INH IN AEPB
1.0000 | INHALATION_SPRAY | Freq: Every day | RESPIRATORY_TRACT | Status: DC
  Administered 2020-07-24 – 2020-07-28 (×5): 1 via RESPIRATORY_TRACT
  Filled 2020-07-21 (×2): qty 28

## 2020-07-21 MED ORDER — LORAZEPAM 2 MG/ML IJ SOLN
0.0000 mg | INTRAMUSCULAR | Status: AC
  Administered 2020-07-22 (×2): 1 mg via INTRAVENOUS
  Administered 2020-07-23: 2 mg via INTRAVENOUS
  Administered 2020-07-23: 1 mg via INTRAVENOUS
  Filled 2020-07-21 (×4): qty 1

## 2020-07-21 MED ORDER — ENOXAPARIN SODIUM 40 MG/0.4ML IJ SOSY
40.0000 mg | PREFILLED_SYRINGE | INTRAMUSCULAR | Status: DC
  Administered 2020-07-21 – 2020-07-24 (×4): 40 mg via SUBCUTANEOUS
  Filled 2020-07-21 (×4): qty 0.4

## 2020-07-21 MED ORDER — SODIUM CHLORIDE 0.9 % IV SOLN
500.0000 mg | Freq: Once | INTRAVENOUS | Status: AC
  Administered 2020-07-21: 500 mg via INTRAVENOUS
  Filled 2020-07-21: qty 500

## 2020-07-21 MED ORDER — LORAZEPAM 1 MG PO TABS
1.0000 mg | ORAL_TABLET | ORAL | Status: AC | PRN
  Administered 2020-07-23: 1 mg via ORAL
  Filled 2020-07-21: qty 1

## 2020-07-21 MED ORDER — ACETAMINOPHEN 325 MG PO TABS: 650.0000 mg | ORAL_TABLET | Freq: Four times a day (QID) | ORAL | Status: DC | PRN

## 2020-07-21 MED ORDER — QUETIAPINE FUMARATE 25 MG PO TABS
50.0000 mg | ORAL_TABLET | Freq: Every day | ORAL | Status: DC
  Administered 2020-07-22 – 2020-07-27 (×7): 50 mg via ORAL
  Filled 2020-07-21: qty 2
  Filled 2020-07-21 (×2): qty 1
  Filled 2020-07-21 (×3): qty 2
  Filled 2020-07-21: qty 1
  Filled 2020-07-21: qty 2

## 2020-07-21 MED ORDER — SODIUM CHLORIDE 0.9 % IV SOLN
2.0000 g | Freq: Three times a day (TID) | INTRAVENOUS | Status: DC
  Administered 2020-07-21 – 2020-07-23 (×5): 2 g via INTRAVENOUS
  Filled 2020-07-21 (×5): qty 2

## 2020-07-21 MED ORDER — ALBUTEROL SULFATE (2.5 MG/3ML) 0.083% IN NEBU: 2.5000 mg | INHALATION_SOLUTION | Freq: Four times a day (QID) | RESPIRATORY_TRACT | Status: DC | PRN

## 2020-07-21 MED ORDER — HYDROCODONE-ACETAMINOPHEN 5-325 MG PO TABS
1.0000 | ORAL_TABLET | ORAL | Status: DC | PRN
  Administered 2020-07-22 – 2020-07-28 (×29): 1 via ORAL
  Filled 2020-07-21 (×31): qty 1

## 2020-07-21 MED ORDER — MORPHINE SULFATE (PF) 4 MG/ML IV SOLN
4.0000 mg | Freq: Once | INTRAVENOUS | Status: AC
  Administered 2020-07-21: 4 mg via INTRAVENOUS
  Filled 2020-07-21: qty 1

## 2020-07-21 MED ORDER — SUCRALFATE 1 G PO TABS
1.0000 g | ORAL_TABLET | Freq: Two times a day (BID) | ORAL | Status: DC
  Administered 2020-07-22 – 2020-07-28 (×13): 1 g via ORAL
  Filled 2020-07-21 (×13): qty 1

## 2020-07-21 MED ORDER — METRONIDAZOLE 500 MG/100ML IV SOLN
500.0000 mg | Freq: Three times a day (TID) | INTRAVENOUS | Status: DC
Start: 2020-07-21 — End: 2020-07-23
  Administered 2020-07-22 – 2020-07-23 (×5): 500 mg via INTRAVENOUS
  Filled 2020-07-21 (×5): qty 100

## 2020-07-21 MED ORDER — LORAZEPAM 2 MG/ML IJ SOLN
1.0000 mg | INTRAMUSCULAR | Status: AC | PRN
Start: 2020-07-21 — End: 2020-07-24

## 2020-07-21 MED ORDER — SODIUM CHLORIDE 0.9 % IV SOLN
500.0000 mg | INTRAVENOUS | Status: DC
  Administered 2020-07-22: 500 mg via INTRAVENOUS
  Filled 2020-07-21 (×2): qty 500

## 2020-07-21 MED ORDER — UMECLIDINIUM BROMIDE 62.5 MCG/INH IN AEPB
1.0000 | INHALATION_SPRAY | Freq: Every day | RESPIRATORY_TRACT | Status: DC
  Administered 2020-07-24 – 2020-07-28 (×5): 1 via RESPIRATORY_TRACT
  Filled 2020-07-21 (×2): qty 7

## 2020-07-21 MED ORDER — ADULT MULTIVITAMIN W/MINERALS CH
1.0000 | ORAL_TABLET | Freq: Every day | ORAL | Status: DC
  Administered 2020-07-21 – 2020-07-28 (×8): 1 via ORAL
  Filled 2020-07-21 (×8): qty 1

## 2020-07-21 MED ORDER — MORPHINE SULFATE (PF) 2 MG/ML IV SOLN
2.0000 mg | INTRAVENOUS | Status: DC | PRN
  Administered 2020-07-21 – 2020-07-22 (×2): 2 mg via INTRAVENOUS
  Filled 2020-07-21 (×2): qty 1

## 2020-07-21 MED ORDER — LACTATED RINGERS IV BOLUS (SEPSIS)
1000.0000 mL | Freq: Once | INTRAVENOUS | Status: AC
  Administered 2020-07-21: 1000 mL via INTRAVENOUS

## 2020-07-21 MED ORDER — ACETAMINOPHEN 500 MG PO TABS
1000.0000 mg | ORAL_TABLET | Freq: Once | ORAL | Status: AC
  Administered 2020-07-21: 1000 mg via ORAL
  Filled 2020-07-21: qty 2

## 2020-07-21 MED ORDER — THIAMINE HCL 100 MG PO TABS
100.0000 mg | ORAL_TABLET | Freq: Every day | ORAL | Status: DC
  Administered 2020-07-23 – 2020-07-28 (×6): 100 mg via ORAL
  Filled 2020-07-21 (×8): qty 1

## 2020-07-21 NOTE — ED Provider Notes (Signed)
[facility] EMERGENCY DEPARTMENT Provider Note   CSN: [id number] Arrival date & time: 07/21/20  1601     History Chief Complaint  Patient presents with  . Shortness of Breath    [name] is a 60 y.o. male with past medical history of esophageal perforation in 2019 status post partial esophaectomy and gastric pull-up, GERD, gastritis and esophagitis with Barrett's esophagus, hypertension, asthma and alcohol use disorder who presents for evaluation of left flank pain and shortness of breath.  Patient reports that for the past several days that he has been experiencing pain with urination, air in his urine, mild cough, and chills. Early in the morning, patient woke up with the sudden onset of 12 out of 10 left flank pain, shortness of breath and chest pain. He states that his left flank pain worsens with deep inspiration and is improved with nothing. He denies similar episodes in the past. He denies a prior history of atrial fibrillation, however he states that he has been told this "heart has abnormal beats." He is not on anticoagulation.    Past Medical History:  Diagnosis Date  . Asthma   . Asthma in adult, mild intermittent, uncomplicated 06/05/2020  . Essential hypertension 06/05/2020  . GERD with esophagitis 06/05/2020  . History of alcohol abuse 06/05/2020   Patient Active Problem List   Diagnosis Date Noted  . Irregular cardiac rhythm 07/21/2020  . Mild renal insufficiency 07/21/2020  . HCAP (healthcare-associated pneumonia) 07/21/2020  . Abdominal pain   . Sepsis (HCC) 06/06/2020  . Sepsis secondary to UTI (HCC) 06/05/2020  . Hypokalemia due to excessive gastrointestinal loss of potassium 06/05/2020  . Sinus tachycardia 06/05/2020  . GERD with esophagitis 06/05/2020  . History of alcohol abuse 06/05/2020  . Essential hypertension 06/05/2020  . Intractable nausea and vomiting 06/05/2020  . Asthma in adult, mild intermittent, uncomplicated 06/05/2020  .  Sigmoid diverticulitis 06/05/2020   History reviewed. No pertinent surgical history.  Family History  Problem Relation Age of Onset  . Cancer Sister     Social History   Tobacco Use  . Smoking status: Former Games developer  . Smokeless tobacco: Never Used  Substance Use Topics  . Alcohol use: Yes    Comment: not clear how much he drinks  . Drug use: Yes    Types: Marijuana    Home Medications Prior to Admission medications   Medication Sig Start Date End Date Taking? Authorizing Provider  albuterol (VENTOLIN HFA) 108 (90 Base) MCG/ACT inhaler Inhale 2 puffs into the lungs every 6 (six) hours as needed for wheezing or shortness of breath.    [provider]  docusate sodium (COLACE) 100 MG capsule Take 100 mg by mouth 2 (two) times daily.    [provider]  Ensure (ENSURE) Take 237 mLs by mouth 2 (two) times daily as needed (feeding supplement).    [provider]  famotidine (PEPCID) 40 MG tablet Take 40 mg by mouth at bedtime as needed for heartburn or indigestion.    [provider]  Fluticasone-Umeclidin-Vilant (TRELEGY ELLIPTA) 100-62.5-25 MCG/INH AEPB Inhale 1 puff into the lungs daily.    [provider]  metoCLOPramide (REGLAN) 5 MG tablet Take 1 tablet (5 mg total) by mouth every 8 (eight) hours as needed for nausea. 06/09/20 07/09/20  Ghimire, Werner Lean, MD  metoprolol tartrate (LOPRESSOR) 50 MG tablet Take 50 mg by mouth 2 (two) times daily.    [provider]  ondansetron (ZOFRAN) 4 MG tablet Take  4 mg by mouth every 8 (eight) hours as needed for nausea or vomiting. 04/20/20   [provider]  pantoprazole (PROTONIX) 40 MG tablet Take 40 mg by mouth daily.    [provider]  QUEtiapine (SEROQUEL) 50 MG tablet Take 50 mg by mouth at bedtime.    [provider]  sucralfate (CARAFATE) 1 g tablet Take 1 g by mouth 2 (two) times daily before a meal.    [provider]   Allergies     Penicillins  Review of Systems   Review of Systems  Constitutional: Positive for chills and fever.  HENT: Negative for ear pain and sore throat.   Eyes: Negative for pain and visual disturbance.  Respiratory: Positive for cough and shortness of breath.   Cardiovascular: Positive for chest pain. Negative for palpitations.  Gastrointestinal: Positive for nausea. Negative for abdominal pain and vomiting.  Genitourinary: Positive for dysuria and flank pain. Negative for hematuria.  Musculoskeletal: Negative for arthralgias and back pain.  Skin: Negative for color change and rash.  Neurological: Negative for seizures and syncope.  All other systems reviewed and are negative.   Physical Exam Updated Vital Signs BP 104/65   Pulse (!) 115   Temp (!) 102 F (38.9 C) (Rectal)   Resp (!) 26   Ht 5\' 8"  (1.727 m)   Wt 63.5 kg   SpO2 97%   BMI 21.29 kg/m   Physical Exam Vitals and nursing note reviewed.  Constitutional:      General: He is in acute distress.     Appearance: He is well-developed. He is ill-appearing and diaphoretic.  HENT:     Head: Normocephalic and atraumatic.  Eyes:     Conjunctiva/sclera: Conjunctivae normal.  Cardiovascular:     Rate and Rhythm: Tachycardia present. Rhythm irregular.     Heart sounds: No murmur heard.   Pulmonary:     Effort: Pulmonary effort is normal. No respiratory distress.     Breath sounds: Normal breath sounds.  Abdominal:     Palpations: Abdomen is soft.     Tenderness: There is no abdominal tenderness. There is left CVA tenderness.  Musculoskeletal:     Cervical back: Neck supple.     Right lower leg: No edema.     Left lower leg: No edema.  Skin:    General: Skin is warm.  Neurological:     General: No focal deficit present.     Mental Status: He is alert and oriented to person, place, and time.  Psychiatric:        Mood and Affect: Mood is anxious.        Behavior: Behavior normal.    ED Results / Procedures /  Treatments   Labs (all labs ordered are listed, but only abnormal results are displayed) Labs Reviewed  CBC WITH DIFFERENTIAL/PLATELET - Abnormal; Notable for the following components:      Result Value   WBC 28.0 (*)    RBC 4.00 (*)    Hemoglobin 11.6 (*)    HCT 35.8 (*)    Neutro Abs 24.9 (*)    Lymphs Abs 0.5 (*)    Monocytes Absolute 2.1 (*)    Abs Immature Granulocytes 0.49 (*)    All other components within normal limits  COMPREHENSIVE METABOLIC PANEL - Abnormal; Notable for the following components:   CO2 19 (*)    Glucose, Bld 127 (*)    Creatinine, Ser 1.36 (*)    All other components within  normal limits  LACTIC ACID, PLASMA - Abnormal; Notable for the following components:   Lactic Acid, Venous 6.2 (*)    All other components within normal limits  I-STAT CHEM 8, ED - Abnormal; Notable for the following components:   Glucose, Bld 117 (*)    Hemoglobin 11.9 (*)    HCT 35.0 (*)    All other components within normal limits  RESP PANEL BY RT-PCR (FLU A&B, COVID) ARPGX2  URINE CULTURE  CULTURE, BLOOD (ROUTINE X 2)  CULTURE, BLOOD (ROUTINE X 2)  C DIFFICILE QUICK SCREEN W PCR REFLEX  LIPASE, BLOOD  URINALYSIS, ROUTINE W REFLEX MICROSCOPIC  LACTIC ACID, PLASMA  PROTIME-INR  APTT  BASIC METABOLIC PANEL  MAGNESIUM  CBC  TSH  PROCALCITONIN  PROCALCITONIN  STREP PNEUMONIAE URINARY ANTIGEN  LEGIONELLA PNEUMOPHILA SEROGP 1 UR AG  TROPONIN I (HIGH SENSITIVITY)  TROPONIN I (HIGH SENSITIVITY)   EKG EKG Interpretation  Date/Time:  Saturday Jul 21 2020 16:12:11 EDT Ventricular Rate:  153 PR Interval:  124 QRS Duration: 62 QT Interval:  258 QTC Calculation: 411 R Axis:   43 Text Interpretation: afib with RVR ST & T wave abnormality, consider inferolateral ischemia Abnormal ECG Confirmed by Richardean Canal ([id number]) on 07/21/2020 4:26:59 PM  Radiology DG Chest Port 1 View  Result Date: 07/21/2020 CLINICAL DATA:  Shortness of breath and chest pain in a 60 year old  male. EXAM: PORTABLE CHEST 1 VIEW COMPARISON:  Prior studies from April of 2022 both CT and chest x-ray. FINDINGS: Image rotated to the LEFT. Accounting for this cardiomediastinal contours are stable. Signs of esophagectomy and gastric pull-through, intrathoracic stomach projects into the LEFT and RIGHT retrocardiac and juxta cardiac regions. Mildly increased interstitial markings with suspected patchy opacities in the LEFT mid chest. No signs of lobar consolidation. No visible pleural effusion. Signs of RIGHT thoracotomy again noted. IMPRESSION: 1. Mildly increased interstitial markings with suspected patchy opacities in the LEFT mid chest. Findings could reflect developing pneumonitis/infection. 2. Signs of esophagectomy and gastric pull-through. Electronically Signed   By: Donzetta Kohut M.D.   On: 07/21/2020 16:48   CT Renal Stone Study  Result Date: 07/21/2020 CLINICAL DATA:  Flank pain. EXAM: CT ABDOMEN AND PELVIS WITHOUT CONTRAST TECHNIQUE: Multidetector CT imaging of the abdomen and pelvis was performed following the standard protocol without IV contrast. COMPARISON:  June 05, 2020 FINDINGS: Lower chest: Prior gastric pull-through surgery is seen with postoperative changes noted along the posteromedial aspect of the right lung base. Mild areas of bibasilar atelectasis and/or infiltrate are also noted. Hepatobiliary: No focal liver abnormality is seen. No gallstones, gallbladder wall thickening, or biliary dilatation. Pancreas: Unremarkable. No pancreatic ductal dilatation or surrounding inflammatory changes. Spleen: Normal in size without focal abnormality. Adrenals/Urinary Tract: Adrenal glands are unremarkable. The left kidney is absent. The right kidney is normal in size, without renal calculi, focal lesion, or hydronephrosis. Bladder is unremarkable. Stomach/Bowel: Stomach is within normal limits. Appendix appears normal. No evidence of bowel dilatation. Numerous diverticula are seen within the  descending and sigmoid colon. Moderate severity thickening of the proximal to mid sigmoid colon is also seen with a mild amount of stable inflammatory fat stranding seen adjacent to the posterior aspect of the mid sigmoid colon (axial CT images 54 through 59, CT series number 3). Vascular/Lymphatic: Aortic atherosclerosis. No enlarged abdominal or pelvic lymph nodes. Reproductive: Prostate is unremarkable. Other: No abdominal wall hernia or abnormality. No abdominopelvic ascites. Musculoskeletal: No acute or significant osseous findings. IMPRESSION: 1. Findings consistent with  colitis and/or diverticulitis involving the proximal to mid sigmoid colon. 2. 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 Electronically Signed   By: Aram Candela M.D.   On: 07/21/2020 19:12   Medications Ordered in ED Medications  enoxaparin (LOVENOX) injection 40 mg (has no administration in time range)  lactated ringers infusion (has no administration in time range)  acetaminophen (TYLENOL) tablet 650 mg (has no administration in time range)    Or  acetaminophen (TYLENOL) suppository 650 mg (has no administration in time range)  morphine 2 MG/ML injection 2 mg (has no administration in time range)  HYDROcodone-acetaminophen (NORCO/VICODIN) 5-325 MG per tablet 1 tablet (has no administration in time range)  ondansetron (ZOFRAN) tablet 4 mg (has no administration in time range)    Or  ondansetron (ZOFRAN) injection 4 mg (has no administration in time range)  lactated ringers bolus 1,000 mL (has no administration in time range)  metroNIDAZOLE (FLAGYL) IVPB 500 mg (has no administration in time range)  azithromycin (ZITHROMAX) 500 mg in sodium chloride 0.9 % 250 mL IVPB (has no administration in time range)  metoprolol tartrate (LOPRESSOR) tablet 50 mg (has no administration in time range)  QUEtiapine (SEROQUEL) tablet 50 mg (has no administration in time range)  pantoprazole (PROTONIX) EC  tablet 40 mg (has no administration in time range)  sucralfate (CARAFATE) tablet 1 g (has no administration in time range)  albuterol (PROVENTIL) (2.5 MG/3ML) 0.083% nebulizer solution 2.5 mg (has no administration in time range)  Fluticasone-Umeclidin-Vilant 100-62.5-25 MCG/INH AEPB 1 puff (has no administration in time range)  LORazepam (ATIVAN) tablet 1-4 mg (has no administration in time range)    Or  LORazepam (ATIVAN) injection 1-4 mg (has no administration in time range)  thiamine tablet 100 mg (has no administration in time range)    Or  thiamine (B-1) injection 100 mg (has no administration in time range)  folic acid (FOLVITE) tablet 1 mg (has no administration in time range)  multivitamin with minerals tablet 1 tablet (has no administration in time range)  LORazepam (ATIVAN) injection 0-4 mg (has no administration in time range)    Followed by  LORazepam (ATIVAN) injection 0-4 mg (has no administration in time range)  ceFEPIme (MAXIPIME) 2 g in sodium chloride 0.9 % 100 mL IVPB (has no administration in time range)  diltiazem (CARDIZEM) injection 10 mg (10 mg Intravenous Given 07/21/20 1704)  sodium chloride 0.9 % bolus 1,000 mL (0 mLs Intravenous Stopped 07/21/20 1811)  cefTRIAXone (ROCEPHIN) 1 g in sodium chloride 0.9 % 100 mL IVPB (0 g Intravenous Stopped 07/21/20 1759)  azithromycin (ZITHROMAX) 500 mg in sodium chloride 0.9 % 250 mL IVPB (0 mg Intravenous Stopped 07/21/20 1811)  acetaminophen (TYLENOL) tablet 1,000 mg (1,000 mg Oral Given 07/21/20 1728)  ondansetron (ZOFRAN) injection 4 mg (4 mg Intravenous Given 07/21/20 1756)  morphine 4 MG/ML injection 4 mg (4 mg Intravenous Given 07/21/20 1759)  sodium chloride 0.9 % bolus 1,000 mL (1,000 mLs Intravenous New Bag/Given 07/21/20 1810)   ED Course  I have reviewed the triage vital signs and the nursing notes.  Pertinent labs & imaging results that were available during my care of the patient were reviewed by me and considered in my  medical decision making (see chart for details).    MDM Rules/Calculators/A&P                          Patient presenting with three days of dysuria and  pneumaturia now with sudden onset of left flank pain and shortness of breath found to be in atrial fibrillation with RVR. Patient's presentation is concerning for sepsis secondary to possible emphysematous urinary tract infection with atrial fibrillation as a secondary response to infection. Alternatively, this patient with atrial fibrillation not on anticoagulation is concerning for renal artery infarction. Will obtain CBC, CMP, lipase, troponin, urinalysis, COVID swab, urine culture, lactic acid, CXR, and CT Renal Study. Will start IVF with 1L bolus of normal saline,  injection of diltiazem, ceftriaxone, azithromycin, morphine and zofran.  Patient's repeat temperature of 102. WBC 28, lactic acid of 6.2. Provided patient with additional liter of normal saline. Patient's CXR concerning for left-sided pneumonia and CT shows findings consistent with diverticulitis versus colitis. Patient will require admission for sepsis secondary to diverticulitis or pneumonia, urinary source continues to remain a concern although urinalysis still not collected. [facility] to admit patient.  Final Clinical Impression(s) / ED Diagnoses Final diagnoses:  Atrial fibrillation with rapid ventricular response (HCC)  Sepsis, due to unspecified organism, unspecified whether acute organ dysfunction present (HCC)  HCAP (healthcare-associated pneumonia)   Rx / DC Orders ED Discharge Orders    None       Roylene Reason, MD 07/21/20 2047    Charlynne Pander, MD 07/22/20 1929

## 2020-07-21 NOTE — H&P (Signed)
History and Physical    Colin Hamilton ZOX:[id number] DOB: [birth year] DOA: 07/21/2020  PCP: Pcp, No   Patient coming from: Home   Chief Complaint: SOB, cough, left flank pain, diarrhea, N/V   HPI: Colin Hamilton is a 60 y.o. 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 difficile colitis, now presenting to the emergency department for evaluation of cough, shortness of breath, severe left flank pain, nausea, vomiting, and diarrhea.  The patient reports that he had recovered and returned to his usual state after his hospital discharge last month but then developed recurrent nonbloody diarrhea a few days ago.  He has also had severe pain near the left flank that developed around the same time.  He also notes nausea, nonbloody vomiting, productive cough, and shortness of breath, but his main complaint is the flank pain and diarrhea.  ED Course: Upon arrival to the ED, patient is found to be febrile to 38.9, saturating mid 90s on room air, tachypneic, tachycardic to 130, and with blood pressure 104/65.  EKG with narrow complex irregular tachycardia with rate 153.  Chest x-ray notable for patchy opacity in the left mid chest concerning for developing infection.  CT the abdomen and pelvis demonstrates colitis or diverticulitis involving the proximal to mid sigmoid colon.  Creatinine is 1.36.  WBC 28,000.  Lactic acid 6.2.  COVID PCR is negative.  Patient was treated with IV diltiazem, morphine, Zofran, 1 L of saline, Rocephin, azithromycin, and acetaminophen.  Review of Systems:  All other systems reviewed and apart from HPI, are negative.  Past Medical History:  Diagnosis Date  . Asthma   . Asthma in adult, mild intermittent, uncomplicated 06/05/2020  . Essential hypertension 06/05/2020  . GERD with esophagitis  06/05/2020  . History of alcohol abuse 06/05/2020    History reviewed. No pertinent surgical history.  Social History:   reports that he has quit smoking. He has never used smokeless tobacco. He reports current alcohol use. He reports current drug use. Drug: Marijuana.  Allergies  Allergen Reactions  . Penicillins     Family History  Problem Relation Age of Onset  . Cancer Sister      Prior to Admission medications   Medication Sig Start Date End Date Taking? Authorizing Provider  albuterol (VENTOLIN HFA) 108 (90 Base) MCG/ACT inhaler Inhale 2 puffs into the lungs every 6 (six) hours as needed for wheezing or shortness of breath.    [provider]  docusate sodium (COLACE) 100 MG capsule Take 100 mg by mouth 2 (two) times daily.    [provider]  Ensure (ENSURE) Take 237 mLs by mouth 2 (two) times daily as needed (feeding supplement).    [provider]  famotidine (PEPCID) 40 MG tablet Take 40 mg by mouth at bedtime as needed for heartburn or indigestion.    [provider]  Fluticasone-Umeclidin-Vilant (TRELEGY ELLIPTA) 100-62.5-25 MCG/INH AEPB Inhale 1 puff into the lungs daily.    [provider]  metoCLOPramide (REGLAN) 5 MG tablet Take 1 tablet (5 mg total) by mouth every 8 (eight) hours as needed for nausea. 06/09/20 07/09/20  Ghimire, Werner Lean, MD  metoprolol tartrate (LOPRESSOR) 50 MG tablet Take 50 mg by mouth 2 (two) times daily.    [provider]  ondansetron (ZOFRAN) 4 MG tablet Take 4 mg by mouth every 8 (eight) hours as needed  for nausea or vomiting. 04/20/20   [provider]  pantoprazole (PROTONIX) 40 MG tablet Take 40 mg by mouth daily.    [provider]  QUEtiapine (SEROQUEL) 50 MG tablet Take 50 mg by mouth at bedtime.    [provider]  sucralfate (CARAFATE) 1 g tablet Take 1 g by mouth 2 (two) times daily before a meal.    [provider]    Physical Exam: Vitals:    07/21/20 1720 07/21/20 1805 07/21/20 1807 07/21/20 1930  BP: (!) 146/103  116/79 104/65  Pulse: (S) (!) 131  (!) 40 (!) 115  Resp: (!) 37  (!) 31 (!) 26  Temp: (!) 102 F (38.9 C)     TempSrc: Rectal     SpO2: 97%  95% 97%  Weight:  63.5 kg    Height:  5\' 8"  (1.727 m)      Constitutional: NAD, calm  Eyes: PERTLA, lids and conjunctivae normal ENMT: Mucous membranes are moist. Posterior pharynx clear of any exudate or lesions.   Neck: supple, no masses  Respiratory:  no wheezing, no crackles. No accessory muscle use.  Cardiovascular: Rate ~120 and irregular. No extremity edema.   Abdomen: No distension, soft, generally tender without guarding. Bowel sounds active.  Musculoskeletal: no clubbing / cyanosis. No joint deformity upper and lower extremities.   Skin: no significant rashes, lesions, ulcers. Warm, dry, well-perfused. Neurologic: CN 2-12 grossly intact. Sensation intac. Moving all extremities.   Psychiatric: Alert and oriented to person, place, and situation. Pleasant and cooperative.    Labs and Imaging on Admission: I have personally reviewed following labs and imaging studies  CBC: Recent Labs  Lab 07/21/20 1627 07/21/20 1812  WBC 28.0*  --   NEUTROABS 24.9*  --   HGB 11.6* 11.9*  HCT 35.8* 35.0*  MCV 89.5  --   PLT 279  --    Basic Metabolic Panel: Recent Labs  Lab 07/21/20 1627 07/21/20 1812  NA 137 140  K 3.6 3.6  CL 104 104  CO2 19*  --   GLUCOSE 127* 117*  BUN 10 10  CREATININE 1.36* 1.20  CALCIUM 9.3  --    GFR: Estimated Creatinine Clearance: 60.3 mL/min (by C-G formula based on SCr of 1.2 mg/dL). Liver Function Tests: Recent Labs  Lab 07/21/20 1627  AST 35  ALT 23  ALKPHOS 90  BILITOT 1.1  PROT 7.2  ALBUMIN 3.5   Recent Labs  Lab 07/21/20 1627  LIPASE 24   No results for input(s): AMMONIA in the last 168 hours. Coagulation Profile: No results for input(s): INR, PROTIME in the last 168 hours. Cardiac Enzymes: No results for  input(s): CKTOTAL, CKMB, CKMBINDEX, TROPONINI in the last 168 hours. BNP (last 3 results) No results for input(s): PROBNP in the last 8760 hours. HbA1C: No results for input(s): HGBA1C in the last 72 hours. CBG: No results for input(s): GLUCAP in the last 168 hours. Lipid Profile: No results for input(s): CHOL, HDL, LDLCALC, TRIG, CHOLHDL, LDLDIRECT in the last 72 hours. Thyroid Function Tests: No results for input(s): TSH, T4TOTAL, FREET4, T3FREE, THYROIDAB in the last 72 hours. Anemia Panel: No results for input(s): VITAMINB12, FOLATE, FERRITIN, TIBC, IRON, RETICCTPCT in the last 72 hours. Urine analysis:    Component Value Date/Time   COLORURINE AMBER (A) 06/05/2020 1539   APPEARANCEUR CLOUDY (A) 06/05/2020 1539   LABSPEC 1.015 06/05/2020 1539   PHURINE 7.0 06/05/2020 1539   GLUCOSEU NEGATIVE 06/05/2020 1539   HGBUR SMALL (  A) 06/05/2020 1539   BILIRUBINUR NEGATIVE 06/05/2020 1539   KETONESUR NEGATIVE 06/05/2020 1539   PROTEINUR 100 (A) 06/05/2020 1539   NITRITE POSITIVE (A) 06/05/2020 1539   LEUKOCYTESUR MODERATE (A) 06/05/2020 1539   Sepsis Labs: @LABRCNTIP (procalcitonin:4,lacticidven:4) ) Recent Results (from the past 240 hour(s))  Resp Panel by RT-PCR (Flu A&B, Covid) Nasopharyngeal Swab     Status: None   Collection Time: 07/21/20  5:00 PM   Specimen: Nasopharyngeal Swab; Nasopharyngeal(NP) swabs in vial transport medium  Result Value Ref Range Status   SARS Coronavirus 2 by RT PCR NEGATIVE NEGATIVE Final    Comment: (NOTE) SARS-CoV-2 target nucleic acids are NOT DETECTED.  The SARS-CoV-2 RNA is generally detectable in upper respiratory specimens during the acute phase of infection. The lowest concentration of SARS-CoV-2 viral copies this assay can detect is 138 copies/mL. A negative result does not preclude SARS-Cov-2 infection and should not be used as the sole basis for treatment or other patient management decisions. A negative result may occur with  improper  specimen collection/handling, submission of specimen other than nasopharyngeal swab, presence of viral mutation(s) within the areas targeted by this assay, and inadequate number of viral copies(<138 copies/mL). A negative result must be combined with clinical observations, patient history, and epidemiological information. The expected result is Negative.  Fact Sheet for Patients:  07/23/20  Fact Sheet for Healthcare Providers:  BloggerCourse.com  This test is no t yet approved or cleared by the [employer] FDA and  has been authorized for detection and/or diagnosis of SARS-CoV-2 by FDA under an Emergency Use Authorization (EUA). This EUA will remain  in effect (meaning this test can be used) for the duration of the COVID-19 declaration under Section 564(b)(1) of the Act, 21 U.S.C.section 360bbb-3(b)(1), unless the authorization is terminated  or revoked sooner.       Influenza A by PCR NEGATIVE NEGATIVE Final   Influenza B by PCR NEGATIVE NEGATIVE Final    Comment: (NOTE) The Xpert Xpress SARS-CoV-2/FLU/RSV plus assay is intended as an aid in the diagnosis of influenza from Nasopharyngeal swab specimens and should not be used as a sole basis for treatment. Nasal washings and aspirates are unacceptable for Xpert Xpress SARS-CoV-2/FLU/RSV testing.  Fact Sheet for Patients: [place]  Fact Sheet for Healthcare Providers: BloggerCourse.com  This test is not yet approved or cleared by the [employer] FDA and has been authorized for detection and/or diagnosis of SARS-CoV-2 by FDA under an Emergency Use Authorization (EUA). This EUA will remain in effect (meaning this test can be used) for the duration of the COVID-19 declaration under Section 564(b)(1) of the Act, 21 U.S.C. section 360bbb-3(b)(1), unless the authorization is terminated or revoked.  Performed at  [facility], [address] [place], [place]      Radiological Exams on Admission: DG Chest Port 1 View  Result Date: 07/21/2020 CLINICAL DATA:  Shortness of breath and chest pain in a 60 year old male. EXAM: PORTABLE CHEST 1 VIEW COMPARISON:  Prior studies from April of 2022 both CT and chest x-ray. FINDINGS: Image rotated to the LEFT. Accounting for this cardiomediastinal contours are stable. Signs of esophagectomy and gastric pull-through, intrathoracic stomach projects into the LEFT and RIGHT retrocardiac and juxta cardiac regions. Mildly increased interstitial markings with suspected patchy opacities in the LEFT mid chest. No signs of lobar consolidation. No visible pleural effusion. Signs of RIGHT thoracotomy again noted. IMPRESSION: 1. Mildly increased interstitial markings with suspected patchy opacities in the LEFT mid chest. Findings  could reflect developing pneumonitis/infection. 2. Signs of esophagectomy and gastric pull-through. Electronically Signed   By: Donzetta KohutGeoffrey  Wile M.D.   On: 07/21/2020 16:48   CT Renal Stone Study  Result Date: 07/21/2020 CLINICAL DATA:  Flank pain. EXAM: CT ABDOMEN AND PELVIS WITHOUT CONTRAST TECHNIQUE: Multidetector CT imaging of the abdomen and pelvis was performed following the standard protocol without IV contrast. COMPARISON:  June 05, 2020 FINDINGS: Lower chest: Prior gastric pull-through surgery is seen with postoperative changes noted along the posteromedial aspect of the right lung base. Mild areas of bibasilar atelectasis and/or infiltrate are also noted. Hepatobiliary: No focal liver abnormality is seen. No gallstones, gallbladder wall thickening, or biliary dilatation. Pancreas: Unremarkable. No pancreatic ductal dilatation or surrounding inflammatory changes. Spleen: Normal in size without focal abnormality. Adrenals/Urinary Tract: Adrenal glands are unremarkable. The left kidney is absent. The right kidney is normal in size, without  renal calculi, focal lesion, or hydronephrosis. Bladder is unremarkable. Stomach/Bowel: Stomach is within normal limits. Appendix appears normal. No evidence of bowel dilatation. Numerous diverticula are seen within the descending and sigmoid colon. Moderate severity thickening of the proximal to mid sigmoid colon is also seen with a mild amount of stable inflammatory fat stranding seen adjacent to the posterior aspect of the mid sigmoid colon (axial CT images 54 through 59, CT series number 3). Vascular/Lymphatic: Aortic atherosclerosis. No enlarged abdominal or pelvic lymph nodes. Reproductive: Prostate is unremarkable. Other: No abdominal wall hernia or abnormality. No abdominopelvic ascites. Musculoskeletal: No acute or significant osseous findings. IMPRESSION: 1. Findings consistent with colitis and/or diverticulitis involving the proximal to mid sigmoid colon. 2. 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 Electronically Signed   By: Aram Candelahaddeus  Houston M.D.   On: 07/21/2020 19:12    EKG: Independently reviewed. Narrow complex irregular tachycardia, rate 153.   Assessment/Plan   1. Sepsis secondary to colitis  - Presents with several days of severe left flank pain and diarrhea; was recently treated for C diff with oral vancomycin and had completely recovered  - CT concerning for colitis or diverticulitis involving sigmoid colon  - Obtain blood cultures, start cefepime and Flagyl, check C diff, trend lactate, continue IVF hydration, monitor electrolytes    2. Pneumonia  - Primary complaint is diarrhea and left flank pain but also reports several days of productive cough and SOB and has CXR findings concerning for developing pneumonia on left  - Treated with Rocephin and azithromycin in ED  - Starting cefepime and Flagyl for severe intraabdominal infection, will also continue azithromycin, trend procalcitonin, follow cultures    3. 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 Hypertension  - Continue metoprolol as tolerated    5. Alcohol abuse  - Does not appear to be intoxicated or withdrawing on admission  - Monitor with CIWA, use Ativan as needed    6. Anxiety  - Continue Seroquel    7. GERD with esophagitis  - Continue PPI and Carafate   8. Asthma  - Not in exacerbation on admission  - Continue Trelegy and as-needed albuterol    DVT prophylaxis: Lovenox  Code Status: Full  Level of Care: Level of care: Progressive Family Communication: None present  Disposition Plan:  Patient is from: home  Anticipated d/c is to: TBD Anticipated d/c date is: 07/24/20 Patient currently: Pending cultures, stool studies, improvement in sepsis parameters, tolerance of diet  Consults called: none  Admission status: Inpatient     Briscoe Deutscher, MD [facility]  07/21/2020, 8:23 PM

## 2020-07-21 NOTE — ED Notes (Signed)
Received verbal report from Andrew F at this time 

## 2020-07-21 NOTE — ED Notes (Signed)
Patient transported to CT 

## 2020-07-21 NOTE — ED Triage Notes (Signed)
Pt c/o shortness of breath, left sided flank pain, nausea/vomiting/diarrhea x 1 week. Pt appears pale.

## 2020-07-21 NOTE — Progress Notes (Signed)
Pharmacy Antibiotic Note  Colin Hamilton is a 60 y.o. male admitted on 07/21/2020 presenting with flank pain, N/V, concern for colitis.  Pharmacy has been consulted for cefepime dosing.  Plan: Cefepime 2g IV every 8 hours Monitor renal function, Cx and clinical progression to narrow  Height: 5\' 8"  (172.7 cm) Weight: 63.5 kg (140 lb) IBW/kg (Calculated) : 68.4  Temp (24hrs), Avg:100.6 F (38.1 C), Min:99.1 F (37.3 C), Max:102 F (38.9 C)  Recent Labs  Lab 07/21/20 1627 07/21/20 1646 07/21/20 1812  WBC 28.0*  --   --   CREATININE 1.36*  --  1.20  LATICACIDVEN  --  6.2*  --     Estimated Creatinine Clearance: 60.3 mL/min (by C-G formula based on SCr of 1.2 mg/dL).    Allergies  Allergen Reactions  . Penicillins     07/23/20, PharmD Clinical Pharmacist ED Pharmacist Phone # [phone] 07/21/2020 8:34 PM

## 2020-07-22 DIAGNOSIS — I4891 Unspecified atrial fibrillation: Secondary | ICD-10-CM

## 2020-07-22 DIAGNOSIS — I10 Essential (primary) hypertension: Secondary | ICD-10-CM | POA: Diagnosis not present

## 2020-07-22 LAB — PROCALCITONIN: Procalcitonin: 15.98 ng/mL

## 2020-07-22 LAB — BASIC METABOLIC PANEL
Anion gap: 8 (ref 5–15)
BUN: 11 mg/dL (ref 6–20)
CO2: 24 mmol/L (ref 22–32)
Calcium: 8.4 mg/dL — ABNORMAL LOW (ref 8.9–10.3)
Chloride: 105 mmol/L (ref 98–111)
Creatinine, Ser: 1.16 mg/dL (ref 0.61–1.24)
GFR, Estimated: >60 mL/min (ref >60)
Glucose, Bld: 122 mg/dL — ABNORMAL HIGH (ref 70–99)
Potassium: 4.2 mmol/L (ref 3.5–5.1)
Sodium: 137 mmol/L (ref 135–145)

## 2020-07-22 LAB — CBC
HCT: 34.1 % — ABNORMAL LOW (ref 39.0–52.0)
Hemoglobin: 11.1 g/dL — ABNORMAL LOW (ref 13.0–17.0)
MCH: 29.3 pg (ref 26.0–34.0)
MCHC: 32.6 g/dL (ref 30.0–36.0)
MCV: 90 fL (ref 80.0–100.0)
Platelets: 252 10*3/uL (ref 150–400)
RBC: 3.79 MIL/uL — ABNORMAL LOW (ref 4.22–5.81)
RDW: 14.2 % (ref 11.5–15.5)
WBC: 35.4 10*3/uL — ABNORMAL HIGH (ref 4.0–10.5)
nRBC: 0 % (ref 0.0–0.2)

## 2020-07-22 LAB — STREP PNEUMONIAE URINARY ANTIGEN: Strep Pneumo Urinary Antigen: NEGATIVE

## 2020-07-22 LAB — LACTIC ACID, PLASMA: Lactic Acid, Venous: 4 mmol/L (ref 0.5–1.9)

## 2020-07-22 LAB — TSH: TSH: 1.364 u[IU]/mL (ref 0.350–4.500)

## 2020-07-22 LAB — MAGNESIUM: Magnesium: 1.6 mg/dL — ABNORMAL LOW (ref 1.7–2.4)

## 2020-07-22 MED ORDER — METOPROLOL TARTRATE 12.5 MG HALF TABLET
12.5000 mg | ORAL_TABLET | Freq: Two times a day (BID) | ORAL | Status: DC
  Administered 2020-07-23 – 2020-07-28 (×11): 12.5 mg via ORAL
  Filled 2020-07-22 (×12): qty 1

## 2020-07-22 MED ORDER — MAGNESIUM SULFATE 2 GM/50ML IV SOLN
2.0000 g | Freq: Once | INTRAVENOUS | Status: AC
  Administered 2020-07-22: 2 g via INTRAVENOUS
  Filled 2020-07-22: qty 50

## 2020-07-22 MED ORDER — HYDROMORPHONE HCL 1 MG/ML IJ SOLN
0.5000 mg | INTRAMUSCULAR | Status: DC | PRN
  Administered 2020-07-22 – 2020-07-28 (×28): 0.5 mg via INTRAVENOUS
  Filled 2020-07-22 (×28): qty 1

## 2020-07-22 MED ORDER — FAMOTIDINE IN NACL 20-0.9 MG/50ML-% IV SOLN
20.0000 mg | Freq: Once | INTRAVENOUS | Status: AC
  Administered 2020-07-22: 20 mg via INTRAVENOUS
  Filled 2020-07-22 (×2): qty 50

## 2020-07-22 NOTE — ED Notes (Signed)
Spoke to Dr. Antionette Char in reference to pt c/o worse pain and continued pain, heartburn, vomiting, heart rate, and bp. Received new orders for medication

## 2020-07-22 NOTE — Progress Notes (Signed)
PROGRESS NOTE    Colin Hamilton  ZOX:[id number] DOB: 11/13/1960 DOA: 07/21/2020 PCP: Pcp, No    Brief Narrative:  60 y.o. 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 difficile colitis, now presenting to the emergency department for evaluation of cough, shortness of breath, severe left flank pain, nausea, vomiting, and diarrhea.   Assessment & Plan:   Principal Problem:   Sepsis (HCC) Active Problems:   History of alcohol abuse   Essential hypertension   Sigmoid diverticulitis   Irregular cardiac rhythm   Mild renal insufficiency   HCAP (healthcare-associated pneumonia)  1. Sepsis secondary to colitis  - Presents with several days of severe left flank pain and diarrhea; was recently treated for C diff with oral vancomycin and had completely recovered  - CT concerning for colitis or diverticulitis involving sigmoid colon  - Blood cx thus far neg -Continue on cefepime and Flagyl -Stool cultures and check C diff are pending -Continue IVF hydration    2. Pneumonia with sepsis present on admit - Primary complaint is diarrhea and left flank pain but also reports several days of productive cough and SOB and has CXR findings concerning for developing pneumonia on left side -continued on cefepime and Flagyl for severe intraabdominal infection, will azithromycin was added    3. 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 Hypertension  - BP soft this afternoon -Decreased metoprolol to 12.5mg   5. Alcohol abuse  - Continued with CIWA -No evidence of withdrawal at this time  6. Anxiety  - Continue Seroquel as tolerated  7. GERD with esophagitis  - Continue PPI and Carafate   8. Asthma  -  Not in exacerbation on admission  - Continue Trelegy with PRN albuterol  DVT prophylaxis: Lovenox subq Code Status: Full Family Communication: Pt in room, family not at bedside  Status is: Inpatient  Remains inpatient appropriate because:Inpatient level of care appropriate due to severity of illness   Dispo: The patient is from: Home              Anticipated d/c is to: Home              Patient currently is not medically stable to d/c.   Difficult to place patient No   Consultants:     Procedures:     Antimicrobials: Anti-infectives (From admission, onward)   Start     Dose/Rate Route Frequency Ordered Stop   07/22/20 1700  azithromycin (ZITHROMAX) 500 mg in sodium chloride 0.9 % 250 mL IVPB        500 mg 250 mL/hr over 60 Minutes Intravenous Every 24 hours 07/21/20 2011 07/26/20 1659   07/21/20 2045  ceFEPIme (MAXIPIME) 2 g in sodium chloride 0.9 % 100 mL IVPB        2 g 200 mL/hr over 30 Minutes Intravenous Every 8 hours 07/21/20 2034     07/21/20 2015  metroNIDAZOLE (FLAGYL) IVPB 500 mg        500 mg 100 mL/hr over 60 Minutes Intravenous Every 8 hours 07/21/20 2011     07/21/20 1700  cefTRIAXone (ROCEPHIN) 1 g in sodium chloride 0.9 % 100 mL IVPB        1 g 200 mL/hr over 30 Minutes  Intravenous  Once 07/21/20 1649 07/21/20 1759   07/21/20 1700  azithromycin (ZITHROMAX) 500 mg in sodium chloride 0.9 % 250 mL IVPB        500 mg 250 mL/hr over 60 Minutes Intravenous  Once 07/21/20 1652 07/21/20 1811       Subjective: Primary concerns is management of his chronic pain  Objective: Vitals:   07/22/20 1130 07/22/20 1300 07/22/20 1421 07/22/20 1500  BP: (!) 135/105 91/61 (!) 90/58 96/80  Pulse: 94 (!) 30 93 97  Resp:  18 20 (!) 21  Temp:   (!) 97.5 F (36.4 C) (!) 97.5 F (36.4 C)  TempSrc:   Oral Oral  SpO2:  96% 96% 98%  Weight:      Height:        Intake/Output Summary (Last 24 hours) at 07/22/2020 1727 Last data filed at 07/22/2020 [id number] Gross per 24  hour  Intake 10898.74 ml  Output 1500 ml  Net 9398.74 ml   Filed Weights   07/21/20 1805  Weight: 63.5 kg    Examination: General exam: Awake, laying in bed, in nad Respiratory system: Normal respiratory effort, no wheezing Cardiovascular system: regular rate, s1, s2 Gastrointestinal system: Soft, nondistended, positive BS Central nervous system: CN2-12 grossly intact, strength intact Extremities: Perfused, no clubbing Skin: Normal skin turgor, no notable skin lesions seen, marked tenderness on even gentle brushing over L flank Psychiatry: Mood normal // no visual hallucinations   Data Reviewed: I have personally reviewed following labs and imaging studies  CBC: Recent Labs  Lab 07/21/20 1627 07/21/20 1812 07/22/20 0326  WBC 28.0*  --  35.4*  NEUTROABS 24.9*  --   --   HGB 11.6* 11.9* 11.1*  HCT 35.8* 35.0* 34.1*  MCV 89.5  --  90.0  PLT 279  --  252   Basic Metabolic Panel: Recent Labs  Lab 07/21/20 1627 07/21/20 1812 07/22/20 0326  NA 137 140 137  K 3.6 3.6 4.2  CL 104 104 105  CO2 19*  --  24  GLUCOSE 127* 117* 122*  BUN 10 10 11   CREATININE 1.36* 1.20 1.16  CALCIUM 9.3  --  8.4*  MG  --   --  1.6*   GFR: Estimated Creatinine Clearance: 62.3 mL/min (by C-G formula based on SCr of 1.16 mg/dL). Liver Function Tests: Recent Labs  Lab 07/21/20 1627  AST 35  ALT 23  ALKPHOS 90  BILITOT 1.1  PROT 7.2  ALBUMIN 3.5   Recent Labs  Lab 07/21/20 1627  LIPASE 24   No results for input(s): AMMONIA in the last 168 hours. Coagulation Profile: Recent Labs  Lab 07/21/20 2115  INR 1.2   Cardiac Enzymes: No results for input(s): CKTOTAL, CKMB, CKMBINDEX, TROPONINI in the last 168 hours. BNP (last 3 results) No results for input(s): PROBNP in the last 8760 hours. HbA1C: No results for input(s): HGBA1C in the last 72 hours. CBG: No results for input(s): GLUCAP in the last 168 hours. Lipid Profile: No results for input(s): CHOL, HDL, LDLCALC, TRIG,  CHOLHDL, LDLDIRECT in the last 72 hours. Thyroid Function Tests: Recent Labs    07/22/20 0326  TSH 1.364   Anemia Panel: No results for input(s): VITAMINB12, FOLATE, FERRITIN, TIBC, IRON, RETICCTPCT in the last 72 hours. Sepsis Labs: Recent Labs  Lab 07/21/20 1646 07/21/20 2050 07/21/20 2056 07/22/20 0326  PROCALCITON  --   --  12.14 15.98  LATICACIDVEN 6.2* 5.5*  --  4.0*    Recent Results (from the  past 240 hour(s))  Culture, blood (x 2)     Status: None (Preliminary result)   Collection Time: 07/21/20  4:45 PM   Specimen: BLOOD RIGHT HAND  Result Value Ref Range Status   Specimen Description BLOOD RIGHT HAND  Final   Special Requests   Final    BOTTLES DRAWN AEROBIC AND ANAEROBIC Blood Culture adequate volume   Culture   Final    NO GROWTH < 12 HOURS Performed at [facility], [address], [place], [place] [address]    Report Status PENDING  Incomplete  Culture, blood (x 2)     Status: None (Preliminary result)   Collection Time: 07/21/20  4:50 PM   Specimen: BLOOD LEFT ARM  Result Value Ref Range Status   Specimen Description BLOOD LEFT ARM  Final   Special Requests   Final    BOTTLES DRAWN AEROBIC AND ANAEROBIC Blood Culture adequate volume   Culture   Final    NO GROWTH < 12 HOURS Performed at [facility], [address] [place], [place] [address]    Report Status PENDING  Incomplete  Resp Panel by RT-PCR (Flu A&B, Covid) Nasopharyngeal Swab     Status: None   Collection Time: 07/21/20  5:00 PM   Specimen: Nasopharyngeal Swab; Nasopharyngeal(NP) swabs in vial transport medium  Result Value Ref Range Status   SARS Coronavirus 2 by RT PCR NEGATIVE NEGATIVE Final    Comment: (NOTE) SARS-CoV-2 target nucleic acids are NOT DETECTED.  The SARS-CoV-2 RNA is generally detectable in upper respiratory specimens during the acute phase of infection. The lowest concentration of SARS-CoV-2 viral copies this assay can detect is 138 copies/mL. A negative  result does not preclude SARS-Cov-2 infection and should not be used as the sole basis for treatment or other patient management decisions. A negative result may occur with  improper specimen collection/handling, submission of specimen other than nasopharyngeal swab, presence of viral mutation(s) within the areas targeted by this assay, and inadequate number of viral copies(<138 copies/mL). A negative result must be combined with clinical observations, patient history, and epidemiological information. The expected result is Negative.  Fact Sheet for Patients:  BloggerCourse.com  Fact Sheet for Healthcare Providers:  SeriousBroker.it  This test is no t yet approved or cleared by the [place] FDA and  has been authorized for detection and/or diagnosis of SARS-CoV-2 by FDA under an Emergency Use Authorization (EUA). This EUA will remain  in effect (meaning this test can be used) for the duration of the COVID-19 declaration under Section 564(b)(1) of the Act, 21 U.S.C.section 360bbb-3(b)(1), unless the authorization is terminated  or revoked sooner.       Influenza A by PCR NEGATIVE NEGATIVE Final   Influenza B by PCR NEGATIVE NEGATIVE Final    Comment: (NOTE) The Xpert Xpress SARS-CoV-2/FLU/RSV plus assay is intended as an aid in the diagnosis of influenza from Nasopharyngeal swab specimens and should not be used as a sole basis for treatment. Nasal washings and aspirates are unacceptable for Xpert Xpress SARS-CoV-2/FLU/RSV testing.  Fact Sheet for Patients: BloggerCourse.com  Fact Sheet for Healthcare Providers: SeriousBroker.it  This test is not yet approved or cleared by the [place] FDA and has been authorized for detection and/or diagnosis of SARS-CoV-2 by FDA under an Emergency Use Authorization (EUA). This EUA will remain in effect (meaning this test can be used)  for the duration of the COVID-19 declaration under Section 564(b)(1) of the Act, 21 U.S.C. section 360bbb-3(b)(1),  unless the authorization is terminated or revoked.  Performed at [facility], [address] [place], [place] [address]      Radiology Studies: DG Chest [place] 1 View  Result Date: 07/21/2020 CLINICAL DATA:  Shortness of breath and chest pain in a 60 year old male. EXAM: PORTABLE CHEST 1 VIEW COMPARISON:  Prior studies from April of 2022 both CT and chest x-ray. FINDINGS: Image rotated to the LEFT. Accounting for this cardiomediastinal contours are stable. Signs of esophagectomy and gastric pull-through, intrathoracic stomach projects into the LEFT and RIGHT retrocardiac and juxta cardiac regions. Mildly increased interstitial markings with suspected patchy opacities in the LEFT mid chest. No signs of lobar consolidation. No visible pleural effusion. Signs of RIGHT thoracotomy again noted. IMPRESSION: 1. Mildly increased interstitial markings with suspected patchy opacities in the LEFT mid chest. Findings could reflect developing pneumonitis/infection. 2. Signs of esophagectomy and gastric pull-through. Electronically Signed   By: Donzetta Kohut M.D.   On: 07/21/2020 16:48   CT Renal Stone Study  Result Date: 07/21/2020 CLINICAL DATA:  Flank pain. EXAM: CT ABDOMEN AND PELVIS WITHOUT CONTRAST TECHNIQUE: Multidetector CT imaging of the abdomen and pelvis was performed following the standard protocol without IV contrast. COMPARISON:  June 05, 2020 FINDINGS: Lower chest: Prior gastric pull-through surgery is seen with postoperative changes noted along the posteromedial aspect of the right lung base. Mild areas of bibasilar atelectasis and/or infiltrate are also noted. Hepatobiliary: No focal liver abnormality is seen. No gallstones, gallbladder wall thickening, or biliary dilatation. Pancreas: Unremarkable. No pancreatic ductal dilatation or surrounding inflammatory changes. Spleen:  Normal in size without focal abnormality. Adrenals/Urinary Tract: Adrenal glands are unremarkable. The left kidney is absent. The right kidney is normal in size, without renal calculi, focal lesion, or hydronephrosis. Bladder is unremarkable. Stomach/Bowel: Stomach is within normal limits. Appendix appears normal. No evidence of bowel dilatation. Numerous diverticula are seen within the descending and sigmoid colon. Moderate severity thickening of the proximal to mid sigmoid colon is also seen with a mild amount of stable inflammatory fat stranding seen adjacent to the posterior aspect of the mid sigmoid colon (axial CT images 54 through 59, CT series number 3). Vascular/Lymphatic: Aortic atherosclerosis. No enlarged abdominal or pelvic lymph nodes. Reproductive: Prostate is unremarkable. Other: No abdominal wall hernia or abnormality. No abdominopelvic ascites. Musculoskeletal: No acute or significant osseous findings. IMPRESSION: 1. Findings consistent with colitis and/or diverticulitis involving the proximal to mid sigmoid colon. 2. 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 Electronically Signed   By: Aram Candela M.D.   On: 07/21/2020 19:12    Scheduled Meds: . enoxaparin (LOVENOX) injection  40 mg Subcutaneous Q24H  . fluticasone furoate-vilanterol  1 puff Inhalation Daily   And  . umeclidinium bromide  1 puff Inhalation Daily  . folic acid  1 mg Oral Daily  . LORazepam  0-4 mg Intravenous Q4H   Followed by  . [START ON 07/23/2020] LORazepam  0-4 mg Intravenous Q8H  . metoprolol tartrate  12.5 mg Oral BID  . multivitamin with minerals  1 tablet Oral Daily  . pantoprazole  40 mg Oral Daily  . QUEtiapine  50 mg Oral QHS  . sucralfate  1 g Oral BID AC  . thiamine  100 mg Oral Daily   Or  . thiamine  100 mg Intravenous Daily   Continuous Infusions: . azithromycin    . ceFEPime (MAXIPIME) IV Stopped (07/22/20 1511)  . metronidazole 500 mg (07/22/20 1715)  LOS: 1 day   Rickey Barbara, MD [facility] Pager On Amion  If 7PM-7AM, please contact night-coverage 07/22/2020, 5:27 PM

## 2020-07-22 NOTE — Progress Notes (Signed)
Pt arrived to the floor w/ Harriett Sine ED RN at approx 1500 RN said BP's soft to hold giving pain medication until MD on the floor gives ok. Pt was alert x4, VS: BP: 96/80, Temp: 97.5, R: 21 Pulse 97. Pt c/o pain in his lower back- left side 10/10 and asked for pain medication. MEWS: yellow CIWA: 8. MD [facility] was contacted at 1515, however confusion on who had him, [facility] admissions contacted, MD who was assigned called at 1723 to apologize did have pt. After speaking to admissions MD. Thought was a [facility] transfer. Pt pain still 10/10 at this time- did give ativan 1 mg, started fluids on L AC KVO IV, R hand IV hurt- pt asked to take out and was taken ou. IV abx started and dose of pain med given, pt is currently resting. Will continue to monitor. New CIWA at 1645 is a 4.

## 2020-07-22 NOTE — Plan of Care (Signed)
Progressing, will continue to monitor.  

## 2020-07-22 NOTE — ED Notes (Signed)
CIWA score 4 - no ativan at this time  Pt has call bell and is watching TV - urinal @ BS

## 2020-07-23 DIAGNOSIS — E43 Unspecified severe protein-calorie malnutrition: Secondary | ICD-10-CM | POA: Insufficient documentation

## 2020-07-23 DIAGNOSIS — F1011 Alcohol abuse, in remission: Secondary | ICD-10-CM | POA: Diagnosis not present

## 2020-07-23 DIAGNOSIS — I10 Essential (primary) hypertension: Secondary | ICD-10-CM | POA: Diagnosis not present

## 2020-07-23 DIAGNOSIS — I4891 Unspecified atrial fibrillation: Secondary | ICD-10-CM | POA: Diagnosis not present

## 2020-07-23 LAB — URINE CULTURE: Culture: <10000 — AB

## 2020-07-23 LAB — LEGIONELLA PNEUMOPHILA SEROGP 1 UR AG: L. pneumophila Serogp 1 Ur Ag: NEGATIVE

## 2020-07-23 LAB — CBC
HCT: 33.3 % — ABNORMAL LOW (ref 39.0–52.0)
Hemoglobin: 10.6 g/dL — ABNORMAL LOW (ref 13.0–17.0)
MCH: 28.9 pg (ref 26.0–34.0)
MCHC: 31.8 g/dL (ref 30.0–36.0)
MCV: 90.7 fL (ref 80.0–100.0)
Platelets: 221 10*3/uL (ref 150–400)
RBC: 3.67 MIL/uL — ABNORMAL LOW (ref 4.22–5.81)
RDW: 14 % (ref 11.5–15.5)
WBC: 11.3 10*3/uL — ABNORMAL HIGH (ref 4.0–10.5)
nRBC: 0 % (ref 0.0–0.2)

## 2020-07-23 LAB — BASIC METABOLIC PANEL
Anion gap: 6 (ref 5–15)
BUN: 13 mg/dL (ref 6–20)
CO2: 22 mmol/L (ref 22–32)
Calcium: 8.1 mg/dL — ABNORMAL LOW (ref 8.9–10.3)
Chloride: 107 mmol/L (ref 98–111)
Creatinine, Ser: 1.32 mg/dL — ABNORMAL HIGH (ref 0.61–1.24)
GFR, Estimated: >60 mL/min (ref >60)
Glucose, Bld: 93 mg/dL (ref 70–99)
Potassium: 3.5 mmol/L (ref 3.5–5.1)
Sodium: 135 mmol/L (ref 135–145)

## 2020-07-23 LAB — PROCALCITONIN: Procalcitonin: 11.87 ng/mL

## 2020-07-23 MED ORDER — SODIUM CHLORIDE 0.9 % IV SOLN: INTRAVENOUS | Status: DC

## 2020-07-23 MED ORDER — AZITHROMYCIN 500 MG PO TABS
500.0000 mg | ORAL_TABLET | Freq: Every day | ORAL | Status: DC
  Administered 2020-07-23 – 2020-07-24 (×2): 500 mg via ORAL
  Filled 2020-07-23 (×3): qty 1

## 2020-07-23 MED ORDER — METRONIDAZOLE 500 MG PO TABS
500.0000 mg | ORAL_TABLET | Freq: Three times a day (TID) | ORAL | Status: AC
  Administered 2020-07-23 – 2020-07-25 (×8): 500 mg via ORAL
  Filled 2020-07-23 (×8): qty 1

## 2020-07-23 MED ORDER — SODIUM CHLORIDE 0.9 % IV SOLN
2.0000 g | Freq: Two times a day (BID) | INTRAVENOUS | Status: DC
  Administered 2020-07-23 – 2020-07-25 (×4): 2 g via INTRAVENOUS
  Filled 2020-07-23 (×4): qty 2

## 2020-07-23 MED ORDER — ENSURE ENLIVE PO LIQD
237.0000 mL | Freq: Three times a day (TID) | ORAL | Status: DC
  Administered 2020-07-23 – 2020-07-26 (×7): 237 mL via ORAL

## 2020-07-23 NOTE — Progress Notes (Signed)
PROGRESS NOTE    Colin Hamilton  POE:[id number] DOB: 11/15/1960 DOA: 07/21/2020 PCP: Pcp, No    Brief Narrative:  60 y.o. 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 difficile colitis, now presenting to the emergency department for evaluation of cough, shortness of breath, severe left flank pain, nausea, vomiting, and diarrhea.   Assessment & Plan:   Principal Problem:   Sepsis (HCC) Active Problems:   History of alcohol abuse   Essential hypertension   Sigmoid diverticulitis   Irregular cardiac rhythm   Mild renal insufficiency   HCAP (healthcare-associated pneumonia)   Protein-calorie malnutrition, severe  1. Sepsis secondary to colitis  - Presents with several days of severe left flank pain and diarrhea; was recently treated for C diff with oral vancomycin and had completely recovered  - CT concerning for colitis or diverticulitis involving sigmoid colon  - Blood cx neg, urine culture is unremarkable, reviewed -Pt is continued on on cefepime and Flagyl -Stool cultures and check C diff were ordered, however no further stool is documented -Continued on IVF hydration    2. Pneumonia with sepsis present on admit - Primary complaint is diarrhea and left flank pain but also reports several days of productive cough and SOB and has CXR findings concerning for developing pneumonia on left side -continued on cefepime and Flagyl for severe intraabdominal infection with azithromycin continued -WBC is down to 11k from 35k with procalcitonin down to 11.87 from 15.98  3. 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 Hypertension  - BP was initially soft -Recently decreased metoprolol  to 12.5mg  with bp trends improved to normal  5. Alcohol abuse  - Continued with CIWA  6. Anxiety  - Continue Seroquel as tolerated  7. GERD with esophagitis  - Continue PPI and Carafate   8. Asthma  - Not in exacerbation on admission  - Continue Trelegy with PRN albuterol  9. Severe protein calorie malnutrition - seen by nutrition  DVT prophylaxis: Lovenox subq Code Status: Full Family Communication: Pt in room, family not at bedside  Status is: Inpatient  Remains inpatient appropriate because:Inpatient level of care appropriate due to severity of illness   Dispo: The patient is from: Home              Anticipated d/c is to: Home              Patient currently is not medically stable to d/c.   Difficult to place patient No   Consultants:     Procedures:     Antimicrobials: Anti-infectives (From admission, onward)   Start     Dose/Rate Route Frequency Ordered Stop   07/23/20 2200  ceFEPIme (MAXIPIME) 2 g in sodium chloride 0.9 % 100 mL IVPB        2 g 200 mL/hr over 30 Minutes Intravenous Every 12 hours 07/23/20 0809     07/23/20 2200  azithromycin (ZITHROMAX) tablet 500 mg        500 mg Oral Daily at bedtime 07/23/20 0810 07/26/20 2159   07/23/20 1400  metroNIDAZOLE (FLAGYL) tablet 500 mg        500 mg Oral Every 8 hours 07/23/20 0811     07/22/20 1700  azithromycin (  ZITHROMAX) 500 mg in sodium chloride 0.9 % 250 mL IVPB  Status:  Discontinued        500 mg 250 mL/hr over 60 Minutes Intravenous Every 24 hours 07/21/20 2011 07/23/20 0810   07/21/20 2045  ceFEPIme (MAXIPIME) 2 g in sodium chloride 0.9 % 100 mL IVPB  Status:  Discontinued        2 g 200 mL/hr over 30 Minutes Intravenous Every 8 hours 07/21/20 2034 07/23/20 0809   07/21/20 2015  metroNIDAZOLE (FLAGYL) IVPB 500 mg  Status:  Discontinued        500 mg 100 mL/hr over 60 Minutes Intravenous Every 8 hours 07/21/20 2011 07/23/20 0811   07/21/20 1700  cefTRIAXone (ROCEPHIN) 1 g in sodium chloride  0.9 % 100 mL IVPB        1 g 200 mL/hr over 30 Minutes Intravenous  Once 07/21/20 1649 07/21/20 1759   07/21/20 1700  azithromycin (ZITHROMAX) 500 mg in sodium chloride 0.9 % 250 mL IVPB        500 mg 250 mL/hr over 60 Minutes Intravenous  Once 07/21/20 1652 07/21/20 1811      Subjective: Difficult to obtain given mentation, as pt is mildly sedated from alcohol withdrawal medications this AM  Objective: Vitals:   07/23/20 0900 07/23/20 1000 07/23/20 1100 07/23/20 1200  BP: 105/73 106/60 115/68 106/64  Pulse: 76 72 72 72  Resp: 19 (!) 21 (!) 25 (!) 22  Temp:      TempSrc:      SpO2: 96% 97% 97% 98%  Weight:      Height:        Intake/Output Summary (Last 24 hours) at 07/23/2020 1425 Last data filed at 07/23/2020 0302 Gross per 24 hour  Intake 883.1 ml  Output 300 ml  Net 583.1 ml   Filed Weights   07/21/20 1805 07/23/20 0317  Weight: 63.5 kg 59.5 kg    Examination: General exam: Conversant, in no acute distress Respiratory system: normal chest rise, clear, no audible wheezing Cardiovascular system: regular rhythm, s1-s2 Gastrointestinal system: Nondistended, nontender, pos BS Central nervous system: No seizures, no tremors Extremities: No cyanosis, no joint deformities Skin: No rashes, no pallor Psychiatry: Difficult to assess given mentation  Data Reviewed: I have personally reviewed following labs and imaging studies  CBC: Recent Labs  Lab 07/21/20 1627 07/21/20 1812 07/22/20 0326 07/23/20 0610  WBC 28.0*  --  35.4* 11.3*  NEUTROABS 24.9*  --   --   --   HGB 11.6* 11.9* 11.1* 10.6*  HCT 35.8* 35.0* 34.1* 33.3*  MCV 89.5  --  90.0 90.7  PLT 279  --  252 221   Basic Metabolic Panel: Recent Labs  Lab 07/21/20 1627 07/21/20 1812 07/22/20 0326 07/23/20 0610  NA 137 140 137 135  K 3.6 3.6 4.2 3.5  CL 104 104 105 107  CO2 19*  --  24 22  GLUCOSE 127* 117* 122* 93  BUN CREATININE 1.36* 1.20 1.16 1.32*  CALCIUM 9.3  --  8.4* 8.1*  MG   --   --  1.6*  --    GFR: Estimated Creatinine Clearance: 51.3 mL/min (A) (by C-G formula based on SCr of 1.32 mg/dL (H)). Liver Function Tests: Recent Labs  Lab 07/21/20 1627  AST 35  ALT 23  ALKPHOS 90  BILITOT 1.1  PROT 7.2  ALBUMIN 3.5   Recent Labs  Lab 07/21/20 1627  LIPASE 24   No results  for input(s): AMMONIA in the last 168 hours. Coagulation Profile: Recent Labs  Lab 07/21/20 2115  INR 1.2   Cardiac Enzymes: No results for input(s): CKTOTAL, CKMB, CKMBINDEX, TROPONINI in the last 168 hours. BNP (last 3 results) No results for input(s): PROBNP in the last 8760 hours. HbA1C: No results for input(s): HGBA1C in the last 72 hours. CBG: No results for input(s): GLUCAP in the last 168 hours. Lipid Profile: No results for input(s): CHOL, HDL, LDLCALC, TRIG, CHOLHDL, LDLDIRECT in the last 72 hours. Thyroid Function Tests: Recent Labs    07/22/20 0326  TSH 1.364   Anemia Panel: No results for input(s): VITAMINB12, FOLATE, FERRITIN, TIBC, IRON, RETICCTPCT in the last 72 hours. Sepsis Labs: Recent Labs  Lab 07/21/20 1646 07/21/20 2050 07/21/20 2056 07/22/20 0326 07/23/20 0610  PROCALCITON  --   --  12.14 15.98 11.87  LATICACIDVEN 6.2* 5.5*  --  4.0*  --     Recent Results (from the past 240 hour(s))  Culture, blood (x 2)     Status: None (Preliminary result)   Collection Time: 07/21/20  4:45 PM   Specimen: BLOOD RIGHT HAND  Result Value Ref Range Status   Specimen Description BLOOD RIGHT HAND  Final   Special Requests   Final    BOTTLES DRAWN AEROBIC AND ANAEROBIC Blood Culture adequate volume   Culture   Final    NO GROWTH 2 DAYS Performed at [facility], [address] [place], [place] [address]    Report Status PENDING  Incomplete  Urine Culture     Status: Abnormal   Collection Time: 07/21/20  4:46 PM   Specimen: Urine, Random  Result Value Ref Range Status   Specimen Description URINE, RANDOM  Final   Special Requests NONE  Final    Culture (A)  Final    <10,000 COLONIES/mL INSIGNIFICANT GROWTH Performed at [facility], [address], [place], [place] [address]    Report Status 07/23/2020 FINAL  Final  Culture, blood (x 2)     Status: None (Preliminary result)   Collection Time: 07/21/20  4:50 PM   Specimen: BLOOD LEFT ARM  Result Value Ref Range Status   Specimen Description BLOOD LEFT ARM  Final   Special Requests   Final    BOTTLES DRAWN AEROBIC AND ANAEROBIC Blood Culture adequate volume   Culture   Final    NO GROWTH 2 DAYS Performed at [facility], [address] [place], [place] [address]    Report Status PENDING  Incomplete  Resp Panel by RT-PCR (Flu A&B, Covid) Nasopharyngeal Swab     Status: None   Collection Time: 07/21/20  5:00 PM   Specimen: Nasopharyngeal Swab; Nasopharyngeal(NP) swabs in vial transport medium  Result Value Ref Range Status   SARS Coronavirus 2 by RT PCR NEGATIVE NEGATIVE Final    Comment: (NOTE) SARS-CoV-2 target nucleic acids are NOT DETECTED.  The SARS-CoV-2 RNA is generally detectable in upper respiratory specimens during the acute phase of infection. The lowest concentration of SARS-CoV-2 viral copies this assay can detect is 138 copies/mL. A negative result does not preclude SARS-Cov-2 infection and should not be used as the sole basis for treatment or other patient management decisions. A negative result may occur with  improper specimen collection/handling, submission of specimen other than nasopharyngeal swab, presence of viral mutation(s) within the areas targeted by this assay, and inadequate number of viral copies(<138 copies/mL). A negative result must be combined with clinical  observations, patient history, and epidemiological information. The expected result is Negative.  Fact Sheet for Patients:  BloggerCourse.comhttps://www.fda.gov/media/152166/download  Fact Sheet for Healthcare Providers:  SeriousBroker.ithttps://www.fda.gov/media/152162/download  This test is no t  yet approved or cleared by the [place] FDA and  has been authorized for detection and/or diagnosis of SARS-CoV-2 by FDA under an Emergency Use Authorization (EUA). This EUA will remain  in effect (meaning this test can be used) for the duration of the COVID-19 declaration under Section 564(b)(1) of the Act, 21 U.S.C.section 360bbb-3(b)(1), unless the authorization is terminated  or revoked sooner.       Influenza A by PCR NEGATIVE NEGATIVE Final   Influenza B by PCR NEGATIVE NEGATIVE Final    Comment: (NOTE) The Xpert Xpress SARS-CoV-2/FLU/RSV plus assay is intended as an aid in the diagnosis of influenza from Nasopharyngeal swab specimens and should not be used as a sole basis for treatment. Nasal washings and aspirates are unacceptable for Xpert Xpress SARS-CoV-2/FLU/RSV testing.  Fact Sheet for Patients: BloggerCourse.comhttps://www.fda.gov/media/152166/download  Fact Sheet for Healthcare Providers: SeriousBroker.ithttps://www.fda.gov/media/152162/download  This test is not yet approved or cleared by the [place] FDA and has been authorized for detection and/or diagnosis of SARS-CoV-2 by FDA under an Emergency Use Authorization (EUA). This EUA will remain in effect (meaning this test can be used) for the duration of the COVID-19 declaration under Section 564(b)(1) of the Act, 21 U.S.C. section 360bbb-3(b)(1), unless the authorization is terminated or revoked.  Performed at [facility], [address] [place], [place] [id number]      Radiology Studies: DG Chest [place]  Result Date: 07/21/2020 CLINICAL DATA:  Shortness of breath and chest pain in a 60 year old male. EXAM: PORTABLE CHEST 1 VIEW COMPARISON:  Prior studies from April of 2022 both CT and chest x-ray. FINDINGS: Image rotated to the LEFT. Accounting for this cardiomediastinal contours are stable. Signs of esophagectomy and gastric pull-through, intrathoracic stomach projects into the LEFT and RIGHT retrocardiac and juxta  cardiac regions. Mildly increased interstitial markings with suspected patchy opacities in the LEFT mid chest. No signs of lobar consolidation. No visible pleural effusion. Signs of RIGHT thoracotomy again noted. IMPRESSION: 1. Mildly increased interstitial markings with suspected patchy opacities in the LEFT mid chest. Findings could reflect developing pneumonitis/infection. 2. Signs of esophagectomy and gastric pull-through. Electronically Signed   By: Donzetta KohutGeoffrey  Wile M.D.   On: 07/21/2020 16:48   CT Renal Stone Study  Result Date: 07/21/2020 CLINICAL DATA:  Flank pain. EXAM: CT ABDOMEN AND PELVIS WITHOUT CONTRAST TECHNIQUE: Multidetector CT imaging of the abdomen and pelvis was performed following the standard protocol without IV contrast. COMPARISON:  June 05, 2020 FINDINGS: Lower chest: Prior gastric pull-through surgery is seen with postoperative changes noted along the posteromedial aspect of the right lung base. Mild areas of bibasilar atelectasis and/or infiltrate are also noted. Hepatobiliary: No focal liver abnormality is seen. No gallstones, gallbladder wall thickening, or biliary dilatation. Pancreas: Unremarkable. No pancreatic ductal dilatation or surrounding inflammatory changes. Spleen: Normal in size without focal abnormality. Adrenals/Urinary Tract: Adrenal glands are unremarkable. The left kidney is absent. The right kidney is normal in size, without renal calculi, focal lesion, or hydronephrosis. Bladder is unremarkable. Stomach/Bowel: Stomach is within normal limits. Appendix appears normal. No evidence of bowel dilatation. Numerous diverticula are seen within the descending and sigmoid colon. Moderate severity thickening of the proximal to mid sigmoid colon is also seen with a mild amount of stable inflammatory fat stranding seen adjacent to the posterior  aspect of the mid sigmoid colon (axial CT images 54 through 59, CT series number 3). Vascular/Lymphatic: Aortic atherosclerosis. No  enlarged abdominal or pelvic lymph nodes. Reproductive: Prostate is unremarkable. Other: No abdominal wall hernia or abnormality. No abdominopelvic ascites. Musculoskeletal: No acute or significant osseous findings. IMPRESSION: 1. Findings consistent with colitis and/or diverticulitis involving the proximal to mid sigmoid colon. 2. 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 Electronically Signed   By: Aram Candela M.D.   On: 07/21/2020 19:12    Scheduled Meds: . azithromycin  500 mg Oral QHS  . enoxaparin (LOVENOX) injection  40 mg Subcutaneous Q24H  . feeding supplement  237 mL Oral TID BM  . fluticasone furoate-vilanterol  1 puff Inhalation Daily   And  . umeclidinium bromide  1 puff Inhalation Daily  . folic acid  1 mg Oral Daily  . LORazepam  0-4 mg Intravenous Q4H   Followed by  . LORazepam  0-4 mg Intravenous Q8H  . metoprolol tartrate  12.5 mg Oral BID  . metroNIDAZOLE  500 mg Oral Q8H  . multivitamin with minerals  1 tablet Oral Daily  . pantoprazole  40 mg Oral Daily  . QUEtiapine  50 mg Oral QHS  . sucralfate  1 g Oral BID AC  . thiamine  100 mg Oral Daily   Continuous Infusions: . sodium chloride 75 mL/hr at 07/23/20 1030  . ceFEPime (MAXIPIME) IV       LOS: 2 days   Colin Barbara, MD [facility] Pager On Amion  If 7PM-7AM, please contact night-coverage 07/23/2020, 2:25 PM

## 2020-07-23 NOTE — Progress Notes (Signed)
Initial Nutrition Assessment  DOCUMENTATION CODES:  Severe malnutrition in context of chronic illness  INTERVENTION:  Continue to advance diet as medically able and as tolerated.  Add Ensure Enlive po TID, each supplement provides 350 kcal and 20 grams of protein.  Add Magic cup TID with meals, each supplement provides 290 kcal and 9 grams of protein.  Continue CIWA.  NUTRITION DIAGNOSIS:  Severe Malnutrition related to chronic illness as evidenced by moderate fat depletion,severe fat depletion,moderate muscle depletion,severe muscle depletion.  GOAL:  Patient will meet greater than or equal to 90% of their needs  MONITOR:  Diet advancement,PO intake,Supplement acceptance,Weight trends,I & O's  REASON FOR ASSESSMENT:  Malnutrition Screening Tool    ASSESSMENT:  60 yo male with a PMH of esophageal perforation in 2019 status post esophagectomy with gastric pull-through, Barrett esophagus, GERD, HTN, EtOH abuse, asthma, anxiety, and recent admission with sepsis 2/2 to c.diff colitis who presents with sepsis 2/2 colitis and PNA.  Spoke with RN - pt was given a dose of Ativan and he was very tired during RD visit. He was able to mumble that he likes chocolate and he fell back asleep.  Per Epic pt has lost ~4 (3%) lbs since 06/09/2020, which is not significant for the time frame.  On exam, pt is severely depleted in both fat and muscle stores.  Recommend adding Ensure Enlive TID and Magic Cup TID. Recommend continuing CIWA protocol.  Medications: reviewed; azithromycin, folic acid, Ativan, Flagyl, MVI with minerals, Protonix, sucralfate, thiamine, cefepime, Vicodin PRN (given twice today), Dilaudid PRN (given once today), Zofran PRN (given once today)  Labs: reviewed  NUTRITION - FOCUSED PHYSICAL EXAM: Flowsheet Row Most Recent Value  Orbital Region Severe depletion  Upper Arm Region Severe depletion  Thoracic and Lumbar Region Moderate depletion  Buccal Region Severe depletion   Temple Region Severe depletion  Clavicle Bone Region Severe depletion  Clavicle and Acromion Bone Region Severe depletion  Scapular Bone Region Unable to assess  Dorsal Hand Moderate depletion  Patellar Region Moderate depletion  Anterior Thigh Region Moderate depletion  Posterior Calf Region Moderate depletion  Edema (RD Assessment) None  Hair Reviewed  Eyes Reviewed  Mouth Reviewed  Skin Reviewed  Nails Reviewed     Diet Order:   Diet Order            Diet full liquid Room service appropriate? Yes; Fluid consistency: Thin  Diet effective now                EDUCATION NEEDS:  Education needs have been addressed  Skin:  Skin Assessment: Reviewed RN Assessment  Last BM:  07/22/20  Height:  Ht Readings from Last 1 Encounters:  07/21/20 5\' 8"  (1.727 m)   Weight:  Wt Readings from Last 1 Encounters:  07/23/20 59.5 kg   Ideal Body Weight:  70 kg  BMI:  Body mass index is 19.95 kg/m.  Estimated Nutritional Needs:  Kcal:  2100-2300 Protein:  90-105 grams Fluid:  >2 L  07/25/20, RD, LDN Registered Dietitian After Hours/Weekend Pager # in Amion

## 2020-07-24 DIAGNOSIS — I10 Essential (primary) hypertension: Secondary | ICD-10-CM | POA: Diagnosis not present

## 2020-07-24 DIAGNOSIS — F1011 Alcohol abuse, in remission: Secondary | ICD-10-CM | POA: Diagnosis not present

## 2020-07-24 DIAGNOSIS — I4891 Unspecified atrial fibrillation: Secondary | ICD-10-CM | POA: Diagnosis not present

## 2020-07-24 LAB — COMPREHENSIVE METABOLIC PANEL
ALT: 11 U/L (ref 0–44)
AST: 24 U/L (ref 15–41)
Albumin: 2.7 g/dL — ABNORMAL LOW (ref 3.5–5.0)
Alkaline Phosphatase: 54 U/L (ref 38–126)
Anion gap: 8 (ref 5–15)
BUN: 9 mg/dL (ref 6–20)
CO2: 22 mmol/L (ref 22–32)
Calcium: 8 mg/dL — ABNORMAL LOW (ref 8.9–10.3)
Chloride: 103 mmol/L (ref 98–111)
Creatinine, Ser: 1.21 mg/dL (ref 0.61–1.24)
GFR, Estimated: >60 mL/min (ref >60)
Glucose, Bld: 91 mg/dL (ref 70–99)
Potassium: 3.7 mmol/L (ref 3.5–5.1)
Sodium: 133 mmol/L — ABNORMAL LOW (ref 135–145)
Total Bilirubin: 0.6 mg/dL (ref 0.3–1.2)
Total Protein: 6.3 g/dL — ABNORMAL LOW (ref 6.5–8.1)

## 2020-07-24 LAB — MAGNESIUM: Magnesium: 1.8 mg/dL (ref 1.7–2.4)

## 2020-07-24 LAB — CBC
HCT: 32.8 % — ABNORMAL LOW (ref 39.0–52.0)
Hemoglobin: 10.5 g/dL — ABNORMAL LOW (ref 13.0–17.0)
MCH: 28.7 pg (ref 26.0–34.0)
MCHC: 32 g/dL (ref 30.0–36.0)
MCV: 89.6 fL (ref 80.0–100.0)
Platelets: 237 10*3/uL (ref 150–400)
RBC: 3.66 MIL/uL — ABNORMAL LOW (ref 4.22–5.81)
RDW: 13.8 % (ref 11.5–15.5)
WBC: 8.6 10*3/uL (ref 4.0–10.5)
nRBC: 0 % (ref 0.0–0.2)

## 2020-07-24 MED ORDER — CYCLOBENZAPRINE HCL 10 MG PO TABS
5.0000 mg | ORAL_TABLET | Freq: Three times a day (TID) | ORAL | Status: DC | PRN
  Administered 2020-07-26: 5 mg via ORAL
  Filled 2020-07-24: qty 1

## 2020-07-24 MED ORDER — DIPHENHYDRAMINE HCL 25 MG PO CAPS
25.0000 mg | ORAL_CAPSULE | Freq: Four times a day (QID) | ORAL | Status: DC | PRN
  Administered 2020-07-24 – 2020-07-25 (×2): 25 mg via ORAL
  Filled 2020-07-24 (×2): qty 1

## 2020-07-24 MED ORDER — ALUM & MAG HYDROXIDE-SIMETH 200-200-20 MG/5ML PO SUSP
30.0000 mL | ORAL | Status: DC | PRN
  Administered 2020-07-24 (×2): 30 mL via ORAL
  Filled 2020-07-24 (×2): qty 30

## 2020-07-24 MED ORDER — FLUTICASONE PROPIONATE 50 MCG/ACT NA SUSP
2.0000 | Freq: Every day | NASAL | Status: DC
  Administered 2020-07-25 – 2020-07-28 (×4): 2 via NASAL
  Filled 2020-07-24: qty 16

## 2020-07-24 NOTE — Progress Notes (Signed)
PROGRESS NOTE    Colin Hamilton  TGG:[id number] DOB: [birth year] DOA: 07/21/2020 PCP: Pcp, No    Brief Narrative:  60 y.o. 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 difficile colitis, now presenting to the emergency department for evaluation of cough, shortness of breath, severe left flank pain, nausea, vomiting, and diarrhea.   Assessment & Plan:   Principal Problem:   Sepsis (HCC) Active Problems:   History of alcohol abuse   Essential hypertension   Sigmoid diverticulitis   Irregular cardiac rhythm   Mild renal insufficiency   HCAP (healthcare-associated pneumonia)   Protein-calorie malnutrition, severe  1. Sepsis secondary to colitis  - Presents with several days of severe left flank pain and diarrhea; was recently treated for C diff with oral vancomycin and had completely recovered  - Presenting CT concerning for colitis or diverticulitis involving sigmoid colon  - Blood cx neg, urine culture is unremarkable, reviewed -Pt is continued on on cefepime and Flagyl -Stool cultures and C diff were ordered, however no further stool is documented. At this point, cdiff is extremely unlikely, thus contact isolation has been removed -Continued on IVF hydration   -WBC has normalized today  2. Pneumonia with sepsis present on admit - Primary complaint is diarrhea and left flank pain but also reports several days of productive cough and SOB and has CXR findings concerning for developing pneumonia on left side -continued on cefepime and Flagyl for severe intraabdominal infection with azithromycin continued -WBC is down to 8k from 35k with down trending procal  3. 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 Hypertension  - BP was initially soft -Recently decreased metoprolol to 12.5mg  with bp trends improved to normal  5. Alcohol abuse  - Continued with CIWA -CIWA peaked at 10 overnight, down to 4 this AM  6. Anxiety  - Continue Seroquel as tolerated  7. GERD with esophagitis  - Continue PPI and Carafate   8. Asthma  - Not in exacerbation on admission  - Continue Trelegy with PRN albuterol  9. Severe protein calorie malnutrition - Nutrition was consulted  10. Back/flank pain -on exam, marked tenderness even on minimal palpation -CT abd reviewed, with exception of above findings, no other acute issues were identified -Pt reports pain worse with twisting and re-positioning -Will give trial of K-pad and muscle relaxant  DVT prophylaxis: Lovenox subq Code Status: Full Family Communication: Pt in room, family not at bedside  Status is: Inpatient  Remains inpatient appropriate because:Inpatient level of care appropriate due to severity of illness   Dispo: The patient is from: Home              Anticipated d/c is to: Home              Patient currently is not medically stable to d/c.   Difficult to place patient No   Consultants:     Procedures:     Antimicrobials: Anti-infectives (From admission, onward)   Start     Dose/Rate Route Frequency Ordered Stop   07/23/20 2200  ceFEPIme (MAXIPIME) 2 g in sodium chloride 0.9 % 100 mL IVPB        2 g 200 mL/hr over 30 Minutes  Intravenous Every 12 hours 07/23/20 0809     07/23/20 2200  azithromycin (ZITHROMAX) tablet 500 mg        500 mg Oral Daily at bedtime 07/23/20 0810 07/26/20 2159   07/23/20 1400  metroNIDAZOLE (FLAGYL) tablet 500 mg        500 mg Oral Every 8 hours 07/23/20 0811     07/22/20 1700  azithromycin (ZITHROMAX) 500 mg in sodium chloride 0.9 % 250 mL IVPB  Status:  Discontinued        500 mg 250 mL/hr over 60 Minutes Intravenous Every 24 hours 07/21/20 2011 07/23/20 0810    07/21/20 2045  ceFEPIme (MAXIPIME) 2 g in sodium chloride 0.9 % 100 mL IVPB  Status:  Discontinued        2 g 200 mL/hr over 30 Minutes Intravenous Every 8 hours 07/21/20 2034 07/23/20 0809   07/21/20 2015  metroNIDAZOLE (FLAGYL) IVPB 500 mg  Status:  Discontinued        500 mg 100 mL/hr over 60 Minutes Intravenous Every 8 hours 07/21/20 2011 07/23/20 0811   07/21/20 1700  cefTRIAXone (ROCEPHIN) 1 g in sodium chloride 0.9 % 100 mL IVPB        1 g 200 mL/hr over 30 Minutes Intravenous  Once 07/21/20 1649 07/21/20 1759   07/21/20 1700  azithromycin (ZITHROMAX) 500 mg in sodium chloride 0.9 % 250 mL IVPB        500 mg 250 mL/hr over 60 Minutes Intravenous  Once 07/21/20 1652 07/21/20 1811      Subjective: Complaining of continued back/flank pains  Objective: Vitals:   07/24/20 0615 07/24/20 0849 07/24/20 0853 07/24/20 1135  BP: (!) 147/77  113/89 112/69  Pulse: (!) 108 (!) 111 (!) 101 98  Resp: 20 18 19 15   Temp: 97.6 F (36.4 C)  97.7 F (36.5 C) 98.3 F (36.8 C)  TempSrc: Oral  Oral Oral  SpO2:  97% 98% 99%  Weight:      Height:        Intake/Output Summary (Last 24 hours) at 07/24/2020 1457 Last data filed at 07/24/2020 0600 Gross per 24 hour  Intake 1360 ml  Output 1300 ml  Net 60 ml   Filed Weights   07/21/20 1805 07/23/20 0317 07/24/20 0610  Weight: 63.5 kg 59.5 kg 59.7 kg    Examination: General exam: Awake, laying in bed, in nad Respiratory system: Normal respiratory effort, no wheezing Cardiovascular system: regular rate, s1, s2 Gastrointestinal system: Soft, nondistended, positive BS Central nervous system: CN2-12 grossly intact, strength intact Extremities: Perfused, no clubbing Skin: Normal skin turgor, no notable skin lesions seen Psychiatry: Mood normal // no visual hallucinations   Data Reviewed: I have personally reviewed following labs and imaging studies  CBC: Recent Labs  Lab 07/21/20 1627 07/21/20 1812 07/22/20 0326 07/23/20 0610  07/24/20 0428  WBC 28.0*  --  35.4* 11.3* 8.6  NEUTROABS 24.9*  --   --   --   --   HGB 11.6* 11.9* 11.1* 10.6* 10.5*  HCT 35.8* 35.0* 34.1* 33.3* 32.8*  MCV 89.5  --  90.0 90.7 89.6  PLT 279  --  252 221 237   Basic Metabolic Panel: Recent Labs  Lab 07/21/20 1627 07/21/20 1812 07/22/20 0326 07/23/20 0610 07/24/20 0428  NA 137 140 137 135 133*  K 3.6 3.6 4.2 3.5 3.7  CL 104 104 105 107 103  CO2 19*  --  24 22 22   GLUCOSE 127* 117* 122* 93 91  BUN 10 10 11 13 9   CREATININE 1.36* 1.20 1.16 1.32* 1.21  CALCIUM 9.3  --  8.4* 8.1* 8.0*  MG  --   --  1.6*  --  1.8   GFR: Estimated Creatinine Clearance: 56.2 mL/min (by C-G formula based on SCr of 1.21 mg/dL). Liver Function Tests: Recent Labs  Lab 07/21/20 1627 07/24/20 0428  AST 35 24  ALT 23 11  ALKPHOS 90 54  BILITOT 1.1 0.6  PROT 7.2 6.3*  ALBUMIN 3.5 2.7*   Recent Labs  Lab 07/21/20 1627  LIPASE 24   No results for input(s): AMMONIA in the last 168 hours. Coagulation Profile: Recent Labs  Lab 07/21/20 2115  INR 1.2   Cardiac Enzymes: No results for input(s): CKTOTAL, CKMB, CKMBINDEX, TROPONINI in the last 168 hours. BNP (last 3 results) No results for input(s): PROBNP in the last 8760 hours. HbA1C: No results for input(s): HGBA1C in the last 72 hours. CBG: No results for input(s): GLUCAP in the last 168 hours. Lipid Profile: No results for input(s): CHOL, HDL, LDLCALC, TRIG, CHOLHDL, LDLDIRECT in the last 72 hours. Thyroid Function Tests: Recent Labs    07/22/20 0326  TSH 1.364   Anemia Panel: No results for input(s): VITAMINB12, FOLATE, FERRITIN, TIBC, IRON, RETICCTPCT in the last 72 hours. Sepsis Labs: Recent Labs  Lab 07/21/20 1646 07/21/20 2050 07/21/20 2056 07/22/20 0326 07/23/20 0610  PROCALCITON  --   --  12.14 15.98 11.87  LATICACIDVEN 6.2* 5.5*  --  4.0*  --     Recent Results (from the past 240 hour(s))  Culture, blood (x 2)     Status: None (Preliminary result)   Collection  Time: 07/21/20  4:45 PM   Specimen: BLOOD RIGHT HAND  Result Value Ref Range Status   Specimen Description BLOOD RIGHT HAND  Final   Special Requests   Final    BOTTLES DRAWN AEROBIC AND ANAEROBIC Blood Culture adequate volume   Culture   Final    NO GROWTH 3 DAYS Performed at [facility], [address] [place], [place]    Report Status PENDING  Incomplete  Urine Culture     Status: Abnormal   Collection Time: 07/21/20  4:46 PM   Specimen: Urine, Random  Result Value Ref Range Status   Specimen Description URINE, RANDOM  Final   Special Requests NONE  Final   Culture (A)  Final    <10,000 COLONIES/mL INSIGNIFICANT GROWTH Performed at [facility], [address], [place], [place]    Report Status 07/23/2020 FINAL  Final  Culture, blood (x 2)     Status: None (Preliminary result)   Collection Time: 07/21/20  4:50 PM   Specimen: BLOOD LEFT ARM  Result Value Ref Range Status   Specimen Description BLOOD LEFT ARM  Final   Special Requests   Final    BOTTLES DRAWN AEROBIC AND ANAEROBIC Blood Culture adequate volume   Culture   Final    NO GROWTH 3 DAYS Performed at [facility], [address], [place], [place]    Report Status PENDING  Incomplete  Resp Panel by RT-PCR (Flu A&B, Covid) Nasopharyngeal Swab     Status: None   Collection Time: 07/21/20  5:00 PM   Specimen: Nasopharyngeal Swab; Nasopharyngeal(NP) swabs in vial transport medium  Result Value Ref Range Status   SARS Coronavirus 2 by RT PCR NEGATIVE NEGATIVE Final    Comment: (NOTE) SARS-CoV-2 target nucleic  acids are NOT DETECTED.  The SARS-CoV-2 RNA is generally detectable in upper respiratory specimens during the acute phase of infection. The lowest concentration of SARS-CoV-2 viral copies this assay can detect is 138 copies/mL. A negative result does not preclude SARS-Cov-2 infection and should not be used as the sole basis for treatment or other patient  management decisions. A negative result may occur with  improper specimen collection/handling, submission of specimen other than nasopharyngeal swab, presence of viral mutation(s) within the areas targeted by this assay, and inadequate number of viral copies(<138 copies/mL). A negative result must be combined with clinical observations, patient history, and epidemiological information. The expected result is Negative.  Fact Sheet for Patients:  BloggerCourse.com  Fact Sheet for Healthcare Providers:  SeriousBroker.it  This test is no t yet approved or cleared by the [place] FDA and  has been authorized for detection and/or diagnosis of SARS-CoV-2 by FDA under an Emergency Use Authorization (EUA). This EUA will remain  in effect (meaning this test can be used) for the duration of the COVID-19 declaration under Section 564(b)(1) of the Act, 21 U.S.C.section 360bbb-3(b)(1), unless the authorization is terminated  or revoked sooner.       Influenza A by PCR NEGATIVE NEGATIVE Final   Influenza B by PCR NEGATIVE NEGATIVE Final    Comment: (NOTE) The Xpert Xpress SARS-CoV-2/FLU/RSV plus assay is intended as an aid in the diagnosis of influenza from Nasopharyngeal swab specimens and should not be used as a sole basis for treatment. Nasal washings and aspirates are unacceptable for Xpert Xpress SARS-CoV-2/FLU/RSV testing.  Fact Sheet for Patients: BloggerCourse.com  Fact Sheet for Healthcare Providers: SeriousBroker.it  This test is not yet approved or cleared by the [place] FDA and has been authorized for detection and/or diagnosis of SARS-CoV-2 by FDA under an Emergency Use Authorization (EUA). This EUA will remain in effect (meaning this test can be used) for the duration of the COVID-19 declaration under Section 564(b)(1) of the Act, 21 U.S.C. section 360bbb-3(b)(1),  unless the authorization is terminated or revoked.  Performed at [facility], [address] [place], [place] [address]      Radiology Studies: No results found.  Scheduled Meds: . azithromycin  500 mg Oral QHS  . enoxaparin (LOVENOX) injection  40 mg Subcutaneous Q24H  . feeding supplement  237 mL Oral TID BM  . fluticasone furoate-vilanterol  1 puff Inhalation Daily   And  . umeclidinium bromide  1 puff Inhalation Daily  . folic acid  1 mg Oral Daily  . LORazepam  0-4 mg Intravenous Q8H  . metoprolol tartrate  12.5 mg Oral BID  . metroNIDAZOLE  500 mg Oral Q8H  . multivitamin with minerals  1 tablet Oral Daily  . pantoprazole  40 mg Oral Daily  . QUEtiapine  50 mg Oral QHS  . sucralfate  1 g Oral BID AC  . thiamine  100 mg Oral Daily   Continuous Infusions: . sodium chloride 75 mL/hr at 07/23/20 2232  . ceFEPime (MAXIPIME) IV 2 g (07/24/20 1021)     LOS: 3 days   Rickey Barbara, MD [facility] Pager On Amion  If 7PM-7AM, please contact night-coverage 07/24/2020, 2:57 PM

## 2020-07-25 ENCOUNTER — Inpatient Hospital Stay: Payer: No Typology Code available for payment source | Admitting: Physician Assistant

## 2020-07-25 DIAGNOSIS — I4891 Unspecified atrial fibrillation: Secondary | ICD-10-CM | POA: Diagnosis not present

## 2020-07-25 DIAGNOSIS — F1011 Alcohol abuse, in remission: Secondary | ICD-10-CM | POA: Diagnosis not present

## 2020-07-25 DIAGNOSIS — N289 Disorder of kidney and ureter, unspecified: Secondary | ICD-10-CM | POA: Diagnosis not present

## 2020-07-25 DIAGNOSIS — I10 Essential (primary) hypertension: Secondary | ICD-10-CM | POA: Diagnosis not present

## 2020-07-25 DIAGNOSIS — E43 Unspecified severe protein-calorie malnutrition: Secondary | ICD-10-CM

## 2020-07-25 LAB — COMPREHENSIVE METABOLIC PANEL
ALT: 12 U/L (ref 0–44)
AST: 19 U/L (ref 15–41)
Albumin: 2.8 g/dL — ABNORMAL LOW (ref 3.5–5.0)
Alkaline Phosphatase: 63 U/L (ref 38–126)
Anion gap: 7 (ref 5–15)
BUN: 5 mg/dL — ABNORMAL LOW (ref 6–20)
CO2: 23 mmol/L (ref 22–32)
Calcium: 8.1 mg/dL — ABNORMAL LOW (ref 8.9–10.3)
Chloride: 104 mmol/L (ref 98–111)
Creatinine, Ser: 1.07 mg/dL (ref 0.61–1.24)
GFR, Estimated: >60 mL/min (ref >60)
Glucose, Bld: 102 mg/dL — ABNORMAL HIGH (ref 70–99)
Potassium: 3.2 mmol/L — ABNORMAL LOW (ref 3.5–5.1)
Sodium: 134 mmol/L — ABNORMAL LOW (ref 135–145)
Total Bilirubin: 0.7 mg/dL (ref 0.3–1.2)
Total Protein: 6.2 g/dL — ABNORMAL LOW (ref 6.5–8.1)

## 2020-07-25 LAB — LACTIC ACID, PLASMA
Lactic Acid, Venous: 1.1 mmol/L (ref 0.5–1.9)
Lactic Acid, Venous: 1.3 mmol/L (ref 0.5–1.9)

## 2020-07-25 LAB — CBC WITH DIFFERENTIAL/PLATELET
Abs Immature Granulocytes: 0.22 10*3/uL — ABNORMAL HIGH (ref 0.00–0.07)
Basophils Absolute: 0.1 10*3/uL (ref 0.0–0.1)
Basophils Relative: 1 %
Eosinophils Absolute: 0.3 10*3/uL (ref 0.0–0.5)
Eosinophils Relative: 4 %
HCT: 30.8 % — ABNORMAL LOW (ref 39.0–52.0)
Hemoglobin: 9.9 g/dL — ABNORMAL LOW (ref 13.0–17.0)
Immature Granulocytes: 2 %
Lymphocytes Relative: 15 %
Lymphs Abs: 1.4 10*3/uL (ref 0.7–4.0)
MCH: 28.9 pg (ref 26.0–34.0)
MCHC: 32.1 g/dL (ref 30.0–36.0)
MCV: 89.8 fL (ref 80.0–100.0)
Monocytes Absolute: 1.4 10*3/uL — ABNORMAL HIGH (ref 0.1–1.0)
Monocytes Relative: 15 %
Neutro Abs: 5.8 10*3/uL (ref 1.7–7.7)
Neutrophils Relative %: 63 %
Platelets: 230 10*3/uL (ref 150–400)
RBC: 3.43 MIL/uL — ABNORMAL LOW (ref 4.22–5.81)
RDW: 14.1 % (ref 11.5–15.5)
WBC: 9.2 10*3/uL (ref 4.0–10.5)
nRBC: 0 % (ref 0.0–0.2)

## 2020-07-25 LAB — PHOSPHORUS: Phosphorus: 2.4 mg/dL — ABNORMAL LOW (ref 2.5–4.6)

## 2020-07-25 LAB — MAGNESIUM: Magnesium: 1.8 mg/dL (ref 1.7–2.4)

## 2020-07-25 MED ORDER — AZITHROMYCIN 500 MG PO TABS
500.0000 mg | ORAL_TABLET | Freq: Every day | ORAL | Status: DC
  Administered 2020-07-25 – 2020-07-26 (×2): 500 mg via ORAL
  Filled 2020-07-25 (×3): qty 1

## 2020-07-25 MED ORDER — SODIUM CHLORIDE 0.9 % IV SOLN
2.0000 g | Freq: Two times a day (BID) | INTRAVENOUS | Status: DC
  Administered 2020-07-25 – 2020-07-27 (×4): 2 g via INTRAVENOUS
  Filled 2020-07-25 (×4): qty 2

## 2020-07-25 NOTE — Evaluation (Signed)
Physical Therapy Evaluation Patient Details Name: Colin Hamilton MRN: [id number] DOB: 12/05/1960 Today's Date: 07/25/2020   History of Present Illness  60 y.o. male presents to [facility] ED on 07/21/2020 with reports of diarrhea and severe left flank pain. Chest x-ray notable for LLL PNA. CT the abdomen and pelvis demonstrates colitis or diverticulitis involving the proximal to mid sigmoid colon. PMH includes esophageal perforation in 2019 status post esophagectomy with gastric pull-through, Barrett esophagus, hypertension, alcohol abuse, asthma, anxiety, Cdiff.  Clinical Impression  Pt reports pain limits his ability to participate in physical therapy today, but is agreeable to mobility in the room. Pt demonstrates ability to perform bed mobility, transfers, and gait without the need for physical assistance. Pt tolerates gait for limited household distances without use of an AD, limited by his pain. Pt verbalizes mild instability with gait compared to baseline. Pt presents with deficits in endurance, power, activity tolerance, and pain and will benefit from acute PT to improve safe and independent mobility.     Follow Up Recommendations No PT follow up    Equipment Recommendations  None recommended by PT    Recommendations for Other Services       Precautions / Restrictions Precautions Precautions: None Restrictions Weight Bearing Restrictions: No      Mobility  Bed Mobility Overal bed mobility: Modified Independent             General bed mobility comments: mod I with HOB elevated    Transfers Overall transfer level: Independent Equipment used: None                Ambulation/Gait Ambulation/Gait assistance: Supervision Gait Distance (Feet): 30 Feet Assistive device: None Gait Pattern/deviations: Step-through pattern;Wide base of support Gait velocity: decreased Gait velocity interpretation: >2.62 ft/sec, indicative of community ambulatory General Gait Details: Pt  reports pain limiting gait distance. Pt reports mild instability compared to baseline.  Stairs            Wheelchair Mobility    Modified Rankin (Stroke Patients Only)       Balance Overall balance assessment: No apparent balance deficits (not formally assessed)                                           Pertinent Vitals/Pain Pain Assessment: 0-10 Pain Score: 8  Pain Location: back Pain Descriptors / Indicators: Discomfort;Grimacing Pain Intervention(s): Limited activity within patient's tolerance;Monitored during session    Home Living Family/patient expects to be discharged to:: Private residence Living Arrangements: Non-relatives/Friends Available Help at Discharge: Friend(s);Available 24 hours/day Type of Home: Other(Comment) (townhouse) Home Access: Level entry     Home Layout: Multi-level;Able to live on main level with bedroom/bathroom Home Equipment: None      Prior Function Level of Independence: Independent         Comments: Pt reports completing all ADLs and mobility independently without use of AD.     Hand Dominance        Extremity/Trunk Assessment   Upper Extremity Assessment Upper Extremity Assessment: Overall WFL for tasks assessed    Lower Extremity Assessment Lower Extremity Assessment: Overall WFL for tasks assessed    Cervical / Trunk Assessment Cervical / Trunk Assessment: Normal  Communication   Communication: No difficulties  Cognition Arousal/Alertness: Awake/alert Behavior During Therapy: WFL for tasks assessed/performed Overall Cognitive Status: Within Functional Limits for tasks assessed  General Comments General comments (skin integrity, edema, etc.): Pt reports joint pain exacerbation secondary to weather conditions. Pt agrees to mobilize in room. HR up to 120s, returning to 118 at end of session    Exercises     Assessment/Plan    PT  Assessment Patient needs continued PT services  PT Problem List Decreased activity tolerance;Decreased balance;Decreased mobility;Pain       PT Treatment Interventions Gait training;Stair training;Functional mobility training;Therapeutic activities;Therapeutic exercise;Balance training;Patient/family education    PT Goals (Current goals can be found in the Care Plan section)  Acute Rehab PT Goals Patient Stated Goal: Return to bed PT Goal Formulation: With patient Time For Goal Achievement: 08/08/20 Potential to Achieve Goals: Good    Frequency Min 1X/week   Barriers to discharge        Co-evaluation               AM-PAC PT "6 Clicks" Mobility  Outcome Measure Help needed turning from your back to your side while in a flat bed without using bedrails?: None Help needed moving from lying on your back to sitting on the side of a flat bed without using bedrails?: None Help needed moving to and from a bed to a chair (including a wheelchair)?: None Help needed standing up from a chair using your arms (e.g., wheelchair or bedside chair)?: None Help needed to walk in hospital room?: A Little Help needed climbing 3-5 steps with a railing? : A Little 6 Click Score: 22    End of Session   Activity Tolerance: Patient limited by pain;Patient limited by fatigue Patient left: in bed;with call bell/phone within reach Nurse Communication: Mobility status PT Visit Diagnosis: Unsteadiness on feet (R26.81);Other abnormalities of gait and mobility (R26.89);Pain Pain - Right/Left:  (back) Pain - part of body:  (back)    Time: 5427-0623 PT Time Calculation (min) (ACUTE ONLY): 10 min   Charges:   PT Evaluation $PT Eval Low Complexity: 1 Low          Acute Rehab  Pager: [phone]   Waldemar Dickens, SPT  07/25/2020, 11:50 AM

## 2020-07-25 NOTE — Progress Notes (Addendum)
[facility] Consult: 3:56 PM 07/25/2020  LOS: 4 days    Referring Provider: Carolyne Littles   Primary Care Physician:  Pcp, No Primary Gastroenterologist:  unassigned     Reason for Consultation:  abd pain and diarrhea   HPI: Colin Hamilton is a 60 y.o. male.  Patient just really located from [place].  Past medical history asthma.  GERD.  Arthritis.  Sigmoid diverticulitis (10/2016, 08/2017).    Chronic alcohol abuse.  Fatty liver on CT imaging.  Knee arthroscopy.  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  Atrial fibrillation not on anticoagulation.  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 EGD 11/2016 for upper GI bleed requiring transfusion for blood loss anemia.  Showed severely ulcerated mucosa from the GE junction to 28 cm. EGD 01/2017, unable to access report. Colonoscopy 01/2017.  Severe diverticulosis, colovesicular and multiple colocolonic fistula, colitis.  That time he received treatment for C. difficile toxin and C. difficile antigen positive stool.   EGD 06/2017.  For hematemesis.  Grade 4 Pan esophagitis, nodular GEJ, squamocolumnar junction appeared irregular..  Biopsies obtained. EGD 08/2017 for GI bleed, blood loss anemia.  Severe circumferential ulcerative esophagitis, no active bleeding, no visible vessel.  Hiatal hernia.  Coffee-ground material limited stomach views but no active bleeding.  Normal visualized duodenum. EGD 08/2017 for recurrent upper GI bleed.  Grade 3 esophagitis in distal esophagus.  Irregular appearing square MR columnar junction, not  biopsied.  Medium sized HH.  Normal-appearing stomach and duodenal mucosa. 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 Right VATS 10/2017 At home on Protonix 40 mg/day, Pepcid 40 mg @ HS.    GI inpt consult 4/6 for N/V, abd pain.  Acute symptoms began shortly after drinking a pint of brandy. 06/05/2020 CTAP w contrast showed postop changes in the chest, upper abdomen from what look like gastric pull-through procedure.  Stomach and esophagus fluid-filled.  Hepatic steatosis.  Left colon diverticulosis, stranding at the sigmoid colon may reflect early diverticulitis Dr Orvan Falconer switched to Protonix 40 mg IV bid and restarted Carafate bid, scheduled Reglan.  C diff Ag was positive, toxin negative, toxigenic C diff +.  Treated w oral vancomycin.  Symptoms improved enough for him to discharge home.  Discharged to complete 9 more days of Vanc.   Returns to the hospital, currently day 5 with left flank pain, nausea, vomiting, diarrhea. 07/21/20 renal stone CT showed colitis and/or diverticulitis in the proximal to mid sigmoid.Diagnosed as sepsis from L sided pneumonitis/infection and whatever is going on in colon.  Patient says he has had this pain in his left abdomen for at least a couple of years.  It is currently radiating into his back.  He has periodic bouts of nausea and vomiting at which point he cannot take his oral meds.  He was having multiple watery stools prior to admission along with nonbloody emesis.  After receiving several  doses of hydrocodone, Dilaudid and some morphine, Maalox, the stools have become soft unformed and is only had one in the last 24 hours.   Current antibiotics are metronidazole and cefepime.   Admits to drinking brandy during interval between previous discharge and current admission.  Says it was occasional to deal with pain.  Past Medical History:  Diagnosis Date  . Asthma   . Asthma in adult, mild intermittent, uncomplicated 06/05/2020  . Essential hypertension 06/05/2020  . GERD  with esophagitis 06/05/2020  . History of alcohol abuse 06/05/2020    History reviewed. No pertinent surgical history.  Prior to Admission medications   Medication Sig Start Date End Date Taking? Authorizing Provider  albuterol (VENTOLIN HFA) 108 (90 Base) MCG/ACT inhaler Inhale 2 puffs into the lungs every 6 (six) hours as needed for wheezing or shortness of breath.   Yes [provider]  cyclobenzaprine (FLEXERIL) 10 MG tablet Take 10 mg by mouth at bedtime.   Yes [provider]  docusate sodium (COLACE) 100 MG capsule Take 100 mg by mouth daily.   Yes [provider]  famotidine (PEPCID) 40 MG tablet Take 40 mg by mouth daily.   Yes [provider]  Fluticasone-Umeclidin-Vilant (TRELEGY ELLIPTA) 100-62.5-25 MCG/INH AEPB Inhale 1 puff into the lungs daily.   Yes [provider]  metoCLOPramide (REGLAN) 5 MG tablet Take 1 tablet (5 mg total) by mouth every 8 (eight) hours as needed for nausea. Patient taking differently: Take 5 mg by mouth 2 (two) times daily before a meal. 06/09/20 07/09/20 Yes Ghimire, Werner Lean, MD  metoprolol tartrate (LOPRESSOR) 25 MG tablet Take 25 mg by mouth 2 (two) times daily.   Yes [provider]  ondansetron (ZOFRAN) 8 MG tablet Take 8 mg by mouth every 8 (eight) hours as needed for nausea or vomiting.   Yes [provider]  pantoprazole (PROTONIX) 40 MG tablet Take 40 mg by mouth daily.   Yes [provider]  QUEtiapine (SEROQUEL) 100 MG tablet Take 100 mg by mouth at bedtime.   Yes [provider]  sucralfate (CARAFATE) 1 g tablet Take 1 g by mouth 2 (two) times daily before a meal.   Yes [provider]  metoprolol tartrate (LOPRESSOR) 50 MG tablet Take 50 mg by mouth 2 (two) times daily. Patient not taking: No sig reported    [provider]  QUEtiapine (SEROQUEL) 50 MG tablet Take 50 mg by mouth at bedtime. Patient not taking: No sig reported    [provider]    Scheduled Meds: . azithromycin  500 mg Oral QHS  . feeding supplement  237 mL Oral TID BM  . fluticasone  2 spray Each Nare Daily  . fluticasone furoate-vilanterol  1 puff Inhalation Daily   And  . umeclidinium bromide  1 puff Inhalation Daily  . folic acid  1 mg Oral Daily  . LORazepam  0-4 mg Intravenous Q8H  . metoprolol tartrate  12.5 mg Oral BID  . metroNIDAZOLE  500 mg Oral Q8H  . multivitamin with minerals  1 tablet Oral Daily  . pantoprazole  40 mg Oral Daily  . QUEtiapine  50 mg Oral QHS  . sucralfate  1 g Oral BID AC  . thiamine  100 mg Oral Daily   Infusions: . sodium chloride 75 mL/hr at 07/25/20 0449  . ceFEPime (MAXIPIME) IV     PRN Meds: acetaminophen **OR** acetaminophen, albuterol, alum & mag hydroxide-simeth, cyclobenzaprine, diphenhydrAMINE, HYDROcodone-acetaminophen, HYDROmorphone (DILAUDID)  injection, ondansetron **OR** ondansetron (ZOFRAN) IV   Allergies as of 07/21/2020 - Review Complete 07/21/2020  Allergen Reaction Noted  . Penicillins Swelling 06/05/2020    Family History  Problem Relation Age of Onset  . Cancer Sister     Social History   Socioeconomic History  . Marital status: Single    Spouse name: Not on file  . Number of children: Not on file  . Years of education: Not on file  . Highest education level: Not on file  Occupational History  . Not on file  Tobacco Use  . Smoking status: Former Games developer  . Smokeless tobacco: Never Used  Substance and Sexual Activity  . Alcohol use: Yes    Comment: not clear how much he drinks  . Drug use: Yes    Types: Marijuana  . Sexual activity: Not on file  Other Topics Concern  . Not on file  Social History Narrative  . Not on file   Social Determinants of Health   Financial Resource Strain: Not on file  Food Insecurity: Not on file  Transportation Needs: Not on file  Physical Activity: Not on file  Stress: Not on file  Social Connections: Not on file  Intimate Partner  Violence: Not on file    REVIEW OF SYSTEMS: Constitutional: Teague, weakness. ENT:  No nose bleeds Pulm: No shortness of breath.  No productive cough CV:  No palpitations, no LE edema.  And GU:  No hematuria, no frequency GI: HPI. Heme: No unusual or excessive bleeding or bruising. Transfusions: None. Neuro:  No headaches, no peripheral tingling or numbness.  No syncope, no seizures. Derm:  No itching, no rash or sores.  Endocrine:  No sweats or chills.  No polyuria or dysuria Immunization: Not queried.   PHYSICAL EXAM: Vital signs in last 24 hours: Vitals:   07/25/20 1345 07/25/20 1554  BP: 125/88 140/76  Pulse: (!) 101 99  Resp: 20 19  Temp: 98.5 F (36.9 C) 98.3 F (36.8 C)  SpO2: 96% 97%   Wt Readings from Last 3 Encounters:  07/25/20 59.9 kg  06/09/20 61.4 kg    General: Somewhat chronically ill but not toxic appearing man.  Alert and able to give history. Head: No facial asymmetry or swelling.  No signs of head trauma. Eyes: No conjunctival pallor or scleral icterus.  EOMI Ears: Not hard of hearing Nose: No congestion or discharge Mouth: Oral mucosa is pink, moist, clear.  Tongue midline. Neck: No JVD, no masses, no thyromegaly. Lungs: Diminished breath sounds bilaterally.  No labored breathing or cough. Heart: RRR.  No MRG.  S1, S2 present Abdomen: Soft.  Tender in the left mid and lower abdomen without guarding or rebound.  Active bowel sounds.  No distention..   Rectal: Deferred Musc/Skeltl: No joint redness, swelling or gross deformity. Extremities: No CCE. Neurologic: Alert.  Oriented x3.  Moves all 4 limbs.  No tremors.  No gross weakness. Skin: No sores, rashes, telangiectasia. Nodes: No cervical adenopathy Psych: Calm, cooperative.  Intake/Output from previous day: 05/24 0701 - 05/25 0700 In: 2369.8 [P.O.:500; I.V.:1569.8; IV Piggyback:300] Out: 2100 [Urine:2100] Intake/Output this shift: Total I/O In: -  Out: 850 [Urine:850]  LAB  RESULTS: Recent Labs    07/23/20 0610 07/24/20 0428 07/25/20 1228  WBC 11.3* 8.6 9.2  HGB 10.6* 10.5* 9.9*  HCT 33.3* 32.8* 30.8*  PLT 221 237 230   BMET Lab Results  Component Value Date   NA 134 (L) 07/25/2020   NA 133 (  L) 07/24/2020   NA 135 07/23/2020   K 3.2 (L) 07/25/2020   K 3.7 07/24/2020   K 3.5 07/23/2020   CL 104 07/25/2020   CL 103 07/24/2020   CL 107 07/23/2020   CO2 23 07/25/2020   CO2 22 07/24/2020   CO2 22 07/23/2020   GLUCOSE 102 (H) 07/25/2020   GLUCOSE 91 07/24/2020   GLUCOSE 93 07/23/2020   BUN 5 (L) 07/25/2020   BUN 9 07/24/2020   BUN 13 07/23/2020   CREATININE 1.07 07/25/2020   CREATININE 1.21 07/24/2020   CREATININE 1.32 (H) 07/23/2020   CALCIUM 8.1 (L) 07/25/2020   CALCIUM 8.0 (L) 07/24/2020   CALCIUM 8.1 (L) 07/23/2020   LFT Recent Labs    07/24/20 0428 07/25/20 1228  PROT 6.3* 6.2*  ALBUMIN 2.7* 2.8*  AST 24 19  ALT 11 12  ALKPHOS 54 63  BILITOT 0.6 0.7   PT/INR Lab Results  Component Value Date   INR 1.2 07/21/2020   INR 1.2 06/06/2020   Hepatitis Panel No results for input(s): HEPBSAG, HCVAB, HEPAIGM, HEPBIGM in the last 72 hours. C-Diff No components found for: CDIFF Lipase     Component Value Date/Time   LIPASE 24 07/21/2020 1627    Drugs of Abuse  No results found for: LABOPIA, COCAINSCRNUR, LABBENZ, AMPHETMU, THCU, LABBARB   RADIOLOGY STUDIES: No results found.    IMPRESSION:   *    Diarrhea.  C. difficile positive.  Completed standard 10-day course of vancomycin but diarrhea persisted.  It has resolved since admission in the setting of multiple narcotics and Maalox Stool for repeat C. Difficile yet sent.  Perhaps because he is only had 1 stool in the last 24 hours.  *    Longstanding left abdominal pain.  CT scan 4/5 and 5/21 showing colitis versus diverticulitis in proximal to mid sigmoid. Continues on Flagyl, cefepime.  *   Pneumonia inpatient who was hospitalized 6 weeks ago.  *   Nausea,  vomiting, chronic, intermittent for last few years in pt w hx severe esophagitis, Barrett's esophagus, GI bleeding from esophagitis, spontaneous esophageal rupture requiring repair with postop complication of lung infiltrate requiring VATS.  *    alcohol abuse.  Fatty liver seen on imaging.   PLAN:     *   Await repeat C diff testing.    *   Pursue Flex sig?,  This would be appropriate if we suspect colitis but in the setting of diverticulitis increases risk of bowel perforation.   Jennye Moccasin  07/25/2020, 3:56 PM Phone [phone]  Puzzling presentation, it is not entirely clear how or if the acute symptoms bring him in this time are related to his chronic digestive symptoms.  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  Gastroparesis by his description of previous testing).  On this occasion, he describes his pain as being agonizing and quite clearly in the left flank and worse with movements.  Made him feel short of breath and feverish and then let him to develop vomiting and diarrhea, digestive symptoms which have now resolved.  He had a reported fever and marked leukocytosis, both of which are now resolved.  He is on Maxipime and azithromycin for unclear infectious source.  Blood cultures negative, urine culture with some contaminant.  Chest x-ray was not entirely convincing for pneumonia. He may have had  a primary inflammatory musculoskeletal back problem causing this acute pain on this occasion then causing his chronic digestive symptoms of nausea vomiting diarrhea to flare, giving him a sepsis-like picture.  He quickly states that the flank pain he is having right now is unlike anything he has ever experienced. It is challenging because [place] here, has chronic pain problems and history of substance  abuse.  He says he is back to his baseline upper abdominal discomfort but he would like to try to eat something.  He says he is back to his baseline left lower quadrant pain and he is tender there.  However, he also has marked left lumbar paraspinous tenderness. He is also persistently tachycardic to 110 to 120.  He is alert and conversational and nontoxic-appearing.  I personally reviewed the images from the CT urogram this admission, and I am not certain there is truly a sigmoid colitis, as the images are significantly limited by lack of oral and IV contrast.  He reports his bowel function is now back to his usual baseline of 1 or 2 soft to semiformed stools a day with no blood.   I do not know if he may have persistent C. difficile, though I would not expect that to get better with the antibiotics he received.  CT scan last admission suggested possibly mild focal diverticulitis, then he turned out to have C. Difficile.  My recommendations at this time are:  Soft diet  CT scan abdomen and pelvis with oral and IV contrast to see if there is convincing evidence of colitis/diverticulitis.  C. difficile testing.  It was ordered on admission but not performed for some reason.  It was reordered last evening and he has not yet had a bowel movement to send for specimen.  (This makes ongoing C. difficile much less likely)  After the abdominal CT scan is reviewed, consider MR imaging of his back if this pain and tachycardia persist.   - Amada JupiterHenry Danis, MD    [facility]

## 2020-07-25 NOTE — Progress Notes (Signed)
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  Patient discharged on 06/09/2020 for sepsis secondary to C. difficile colitis.  Born with 1 kidney,   Now presenting to the emergency department for evaluation of cough, shortness of breath, severe LLQ abdominal pain, nausea, vomiting, and watery diarrhea.    Subjective: A/O x4, positive continued watery diarrhea, positive increased LLQ sharp abdominal pain.  Patient states that since his surgeries in 2019 which were performed in [place] he has had abdominal pain which waxes and wanes.  Become severe enough that he must be hospitalized, with change from BM from soft stool to watery diarrhea.  Patient was seen by Dr. Claudette Head, GI on 06/07/2020 during last admission   Assessment & Plan: Covid vaccination;   Principal Problem:   Sepsis (HCC) Active Problems:   History of alcohol abuse   Essential hypertension   Sigmoid diverticulitis   Irregular cardiac rhythm   Mild renal insufficiency   HCAP (healthcare-associated pneumonia)   Protein-calorie malnutrition, severe  Sepsis secondary to colitis -Presents with several days of severe left flank pain and diarrhea; was recently treated for C diff with oral vancomycin and had completely recovered -Presenting CT concerning for colitis or diverticulitis involving sigmoid colon -Blood cx neg, urine culture is unremarkable, reviewed -Pt is continued on on cefepime and Flagyl -Stool cultures and C diff were ordered, however no further stool is documented. At this point, cdiff is extremely unlikely, thus contact isolation has been removed -Continued on IVF hydration -WBC has normalized today -5/25 discussed case with Dr.  Milbert Coulter GI who has agreed to see patient in the a.m. 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 -Cr down to 1.21 Lab Results  Component Value Date   CREATININE 1.07 07/25/2020   CREATININE 1.21 07/24/2020   CREATININE 1.32 (H) 07/23/2020   CREATININE 1.16 07/22/2020   CREATININE 1.20 07/21/2020  -Resolved  Essential HTN -BP was initially soft - Metoprolol 12.5 mg BID  EtOH abuse -Continued with CIWA -5/24 CIWA peaked at 10 overnight, down to 4 this AM  Anxiety -Continue Seroquelas tolerated  GERD with esophagitis -Continue PPI and Carafate  Asthma -Not in exacerbation on admission -Continue Trelegy with PRN albuterol  Severe protein calorie malnutrition --Per nutrition recommendation Ensure Enlive TID and Magic Cup TID  Back/flank pain -on exam, marked tenderness even on minimal palpation -CT abd reviewed, with exception of above findings, no other acute issues were identified -Pt reports pain worse with twisting and re-positioning -Will give trial of K-pad and muscle relaxant           Body mass index is 20.07 kg/m.     DVT prophylaxis: SCD Code Status: Full Family Communication:  Status is: Inpatient    Dispo:  The patient is from: Home              Anticipated d/c is to: SNF?              Anticipated d/c date is: 5/30              Patient currently unstable      Consultants:  5/25 GI  pending   Procedures/Significant Events:    I have personally reviewed and interpreted all radiology studies and my findings are as above.  VENTILATOR SETTINGS:    Cultures   Antimicrobials: Anti-infectives (From admission, onward)   Start     Ordered Stop   07/23/20 2200  ceFEPIme (MAXIPIME) 2 g in sodium chloride 0.9 % 100 mL IVPB        07/23/20 0809 07/25/20 2359   07/23/20 2200  azithromycin (ZITHROMAX) tablet 500 mg        07/23/20 0810 07/26/20 2159   07/23/20 1400  metroNIDAZOLE (FLAGYL) tablet 500 mg        07/23/20 0811 07/25/20 2359   07/22/20 1700  azithromycin (ZITHROMAX) 500 mg in sodium chloride 0.9 % 250 mL IVPB  Status:  Discontinued        07/21/20 2011 07/23/20 0810   07/21/20 2045  ceFEPIme (MAXIPIME) 2 g in sodium chloride 0.9 % 100 mL IVPB  Status:  Discontinued        07/21/20 2034 07/23/20 0809   07/21/20 2015  metroNIDAZOLE (FLAGYL) IVPB 500 mg  Status:  Discontinued        07/21/20 2011 07/23/20 0811   07/21/20 1700  cefTRIAXone (ROCEPHIN) 1 g in sodium chloride 0.9 % 100 mL IVPB        07/21/20 1649 07/21/20 1759   07/21/20 1700  azithromycin (ZITHROMAX) 500 mg in sodium chloride 0.9 % 250 mL IVPB        07/21/20 1652 07/21/20 1811       Devices    LINES / TUBES:      Continuous Infusions: . sodium chloride 75 mL/hr at 07/25/20 0449  . ceFEPime (MAXIPIME) IV 2 g (07/25/20 0831)     Objective: Vitals:   07/24/20 2331 07/25/20 0354 07/25/20 0416 07/25/20 0810  BP: (!) 121/92 139/81  126/77  Pulse: 69 77 (!) 103 (!) 103  Resp: 17 20 19 20   Temp: 97.8 F (36.6 C) 98 F (36.7 C)  98.2 F (36.8 C)  TempSrc: Oral Oral  Oral  SpO2: 93% 93% 96% 97%  Weight:  59.9 kg    Height:        Intake/Output Summary (Last 24 hours) at 07/25/2020 1158 Last data filed at 07/25/2020 07/27/2020 Gross per 24 hour  Intake 2369.84 ml  Output 2625 ml  Net -255.16 ml   Filed Weights   07/23/20 0317 07/24/20 0610 07/25/20 0354  Weight: 59.5 kg 59.7 kg  59.9 kg    Examination:  General:, A/O x4 positive mild acute respiratory distress Eyes: negative scleral hemorrhage, negative anisocoria, negative icterus ENT: Negative Runny nose, negative gingival bleeding, Neck:  Negative scars, masses, torticollis, lymphadenopathy, JVD Lungs: Clear to auscultation bilaterally without wheezes or crackles Cardiovascular: Regular rate and rhythm without murmur gallop or rub normal S1 and S2 Abdomen: Positive LLQ abdominal pain radiating around to the left CVA area, positive mild distention , positive soft, bowel sounds, no rebound, no ascites, no appreciable mass Extremities: No significant cyanosis, clubbing, or edema bilateral lower extremities Skin: Negative rashes, lesions, ulcers Psychiatric:  Negative  depression, negative anxiety, negative fatigue, negative mania  Central nervous system:  Cranial nerves II through XII intact, tongue/uvula midline, all extremities muscle strength 5/5, sensation intact throughout, negative dysarthria, negative expressive aphasia, negative receptive aphasia.  .     Data Reviewed: Care during the described time interval was provided by me .  I have reviewed this patient's available data, including medical history, events of note, physical examination, and all test results as part of my evaluation.  CBC: Recent Labs  Lab 07/21/20 1627 07/21/20 1812 07/22/20 0326 07/23/20 0610 07/24/20 0428  WBC 28.0*  --  35.4* 11.3* 8.6  NEUTROABS 24.9*  --   --   --   --   HGB 11.6* 11.9* 11.1* 10.6* 10.5*  HCT 35.8* 35.0* 34.1* 33.3* 32.8*  MCV 89.5  --  90.0 90.7 89.6  PLT 279  --  252 221 237   Basic Metabolic Panel: Recent Labs  Lab 07/21/20 1627 07/21/20 1812 07/22/20 0326 07/23/20 0610 07/24/20 0428  NA 137 140 137 135 133*  K 3.6 3.6 4.2 3.5 3.7  CL 104 104 105 107 103  CO2 19*  --  24 22 22   GLUCOSE 127* 117* 122* 93 91  BUN 10 10 11 13 9   CREATININE 1.36* 1.20 1.16 1.32* 1.21  CALCIUM 9.3  --  8.4*  8.1* 8.0*  MG  --   --  1.6*  --  1.8   GFR: Estimated Creatinine Clearance: 56.4 mL/min (by C-G formula based on SCr of 1.21 mg/dL). Liver Function Tests: Recent Labs  Lab 07/21/20 1627 07/24/20 0428  AST 35 24  ALT 23 11  ALKPHOS 90 54  BILITOT 1.1 0.6  PROT 7.2 6.3*  ALBUMIN 3.5 2.7*   Recent Labs  Lab 07/21/20 1627  LIPASE 24   No results for input(s): AMMONIA in the last 168 hours. Coagulation Profile: Recent Labs  Lab 07/21/20 2115  INR 1.2   Cardiac Enzymes: No results for input(s): CKTOTAL, CKMB, CKMBINDEX, TROPONINI in the last 168 hours. BNP (last 3 results) No results for input(s): PROBNP in the last 8760 hours. HbA1C: No results for input(s): HGBA1C in the last 72 hours. CBG: No results for input(s): GLUCAP in the last 168 hours. Lipid Profile: No results for input(s): CHOL, HDL, LDLCALC, TRIG, CHOLHDL, LDLDIRECT in the last 72 hours. Thyroid Function Tests: No results for input(s): TSH, T4TOTAL, FREET4, T3FREE, THYROIDAB in the last 72 hours. Anemia Panel: No results for input(s): VITAMINB12, FOLATE, FERRITIN, TIBC, IRON, RETICCTPCT in the last 72 hours. Sepsis Labs: Recent Labs  Lab 07/21/20 1646 07/21/20 2050 07/21/20 2056 07/22/20 0326 07/23/20 0610  PROCALCITON  --   --  12.14 15.98 11.87  LATICACIDVEN 6.2* 5.5*  --  4.0*  --     Recent Results (from the past 240 hour(s))  Culture, blood (x 2)     Status: None (Preliminary result)   Collection Time: 07/21/20  4:45 PM   Specimen: BLOOD RIGHT HAND  Result Value Ref Range Status   Specimen Description BLOOD RIGHT HAND  Final   Special Requests   Final    BOTTLES DRAWN AEROBIC AND ANAEROBIC Blood Culture adequate volume   Culture   Final    NO GROWTH 4 DAYS Performed at [facility], [address] [place], [address] [place]    Report Status PENDING  Incomplete  Urine Culture     Status: Abnormal   Collection Time: 07/21/20  4:46 PM   Specimen: Urine, Random  Result Value Ref  Range Status   Specimen Description URINE, RANDOM  Final   Special Requests NONE  Final   Culture (A)  Final    <10,000 COLONIES/mL INSIGNIFICANT GROWTH Performed at [facility], [address] [place], [place] [address]    Report Status 07/23/2020 FINAL  Final  Culture, blood (x 2)     Status: None (Preliminary result)   Collection Time: 07/21/20  4:50 PM   Specimen: BLOOD LEFT ARM  Result Value Ref Range Status   Specimen Description BLOOD LEFT ARM  Final   Special Requests   Final    BOTTLES DRAWN AEROBIC AND ANAEROBIC Blood Culture adequate volume   Culture   Final    NO GROWTH 4 DAYS Performed at [facility], [address] [place], [place] [address]    Report Status PENDING  Incomplete  Resp Panel by RT-PCR (Flu A&B, Covid) Nasopharyngeal Swab     Status: None   Collection Time: 07/21/20  5:00 PM   Specimen: Nasopharyngeal Swab; Nasopharyngeal(NP) swabs in vial transport medium  Result Value Ref Range Status   SARS Coronavirus 2 by RT PCR NEGATIVE NEGATIVE Final    Comment: (NOTE) SARS-CoV-2 target nucleic acids are NOT DETECTED.  The SARS-CoV-2 RNA is generally detectable in upper respiratory specimens during the acute phase of infection. The lowest concentration of SARS-CoV-2 viral copies this assay can detect is 138 copies/mL. A negative result does not preclude SARS-Cov-2 infection and should not be used as the sole basis for treatment or other patient management decisions. A negative result may occur with  improper specimen collection/handling, submission of specimen other than nasopharyngeal swab, presence of viral mutation(s) within the areas targeted by this assay, and inadequate number of viral copies(<138 copies/mL). A negative result must be combined with clinical observations, patient history, and epidemiological information. The expected result is Negative.  Fact Sheet for Patients:  BloggerCourse.comhttps://www.fda.gov/media/152166/download  Fact Sheet  for Healthcare Providers:  SeriousBroker.ithttps://www.fda.gov/media/152162/download  This test is no t yet approved or cleared by the [place] FDA and  has been authorized for detection and/or diagnosis of SARS-CoV-2 by FDA under an Emergency Use Authorization (EUA). This EUA will remain  in effect (meaning this test can be used) for the duration of the COVID-19 declaration under Section 564(b)(1) of the Act, 21 U.S.C.section 360bbb-3(b)(1), unless the authorization is terminated  or revoked sooner.       Influenza A by PCR NEGATIVE NEGATIVE Final   Influenza B by PCR NEGATIVE NEGATIVE Final    Comment: (NOTE) The Xpert Xpress SARS-CoV-2/FLU/RSV plus assay is intended as an aid in the diagnosis of influenza from Nasopharyngeal swab specimens and should not be used as a sole basis for treatment. Nasal washings and aspirates are unacceptable for Xpert Xpress SARS-CoV-2/FLU/RSV testing.  Fact Sheet for Patients: BloggerCourse.comhttps://www.fda.gov/media/152166/download  Fact Sheet for Healthcare Providers: SeriousBroker.ithttps://www.fda.gov/media/152162/download  This test is not yet approved or cleared by the [place] FDA and has been authorized for detection and/or diagnosis of SARS-CoV-2 by FDA under an Emergency Use Authorization (EUA). This EUA will remain in effect (meaning this test can be used) for the duration of the COVID-19 declaration under Section 564(b)(1) of the Act, 21 U.S.C. section 360bbb-3(b)(1), unless the authorization is terminated or revoked.  Performed at [facility], [address] [place], [place] [address]          Radiology Studies: No results found.      Scheduled Meds: . azithromycin  500 mg Oral QHS  . enoxaparin (LOVENOX) injection  40 mg Subcutaneous Q24H  . feeding supplement  237 mL Oral TID BM  . fluticasone  2 spray Each Nare Daily  . fluticasone furoate-vilanterol  1 puff Inhalation Daily   And  . umeclidinium bromide  1 puff Inhalation Daily  .  folic acid  1 mg Oral Daily  . LORazepam  0-4 mg Intravenous Q8H  . metoprolol tartrate  12.5 mg Oral BID  . metroNIDAZOLE  500 mg Oral Q8H  . multivitamin with minerals  1 tablet Oral Daily  . pantoprazole  40 mg Oral Daily  . QUEtiapine  50 mg Oral QHS  . sucralfate  1 g Oral BID AC  . thiamine  100 mg Oral Daily   Continuous Infusions: . sodium chloride 75 mL/hr at 07/25/20 0449  . ceFEPime (MAXIPIME) IV 2 g (07/25/20 0831)     LOS: 4 days    Time spent:40 min    Eion Timbrook, Roselind Messier, MD [facility]   If 7PM-7AM, please contact night-coverage 07/25/2020, 11:58 AM

## 2020-07-25 NOTE — Progress Notes (Signed)
Patient placed on droplet and enteric precautions for suspicion of c-diff. Order for c-diff testing by PCR. Hat placed in toilet and patient instructed to go in hat next time he has a bowel movement for sample. Brynda Rim, RN

## 2020-07-25 NOTE — Progress Notes (Deleted)
Patient ID: Colin Hamilton, male   DOB: Dec 01, 1960, 60 y.o.   MRN: [id number] Assessment & Plan:   Principal Problem:   Sepsis (HCC) Active Problems:   History of alcohol abuse   Essential hypertension   Sigmoid diverticulitis   Irregular cardiac rhythm   Mild renal insufficiency   HCAP (healthcare-associated pneumonia)   Protein-calorie malnutrition, severe  1.Sepsis secondary to colitis -Presents with several days of severe left flank pain and diarrhea; was recently treated for C diff with oral vancomycin and had completely recovered -Presenting CT concerning for colitis or diverticulitis involving sigmoid colon -Blood cx neg, urine culture is unremarkable, reviewed -Pt is continued on on cefepime and Flagyl -Stool cultures and C diff were ordered, however no further stool is documented. 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 Severe protein  calorie malnutrition - Nutrition was consulted  10. Back/flank pain -on exam, marked tenderness even on minimal palpation -CT abd reviewed, with exception of above findings, no other acute issues were identified -Pt reports pain worse with twisting and re-positioning -Will give trial of K-pad and muscle relaxant

## 2020-07-26 ENCOUNTER — Inpatient Hospital Stay (HOSPITAL_COMMUNITY): Payer: Medicare (Managed Care)

## 2020-07-26 DIAGNOSIS — I10 Essential (primary) hypertension: Secondary | ICD-10-CM | POA: Diagnosis not present

## 2020-07-26 DIAGNOSIS — R112 Nausea with vomiting, unspecified: Secondary | ICD-10-CM

## 2020-07-26 DIAGNOSIS — A419 Sepsis, unspecified organism: Secondary | ICD-10-CM | POA: Diagnosis not present

## 2020-07-26 DIAGNOSIS — R1032 Left lower quadrant pain: Secondary | ICD-10-CM

## 2020-07-26 DIAGNOSIS — F1011 Alcohol abuse, in remission: Secondary | ICD-10-CM | POA: Diagnosis not present

## 2020-07-26 DIAGNOSIS — R197 Diarrhea, unspecified: Secondary | ICD-10-CM

## 2020-07-26 LAB — CBC WITH DIFFERENTIAL/PLATELET
Abs Immature Granulocytes: 0.3 10*3/uL — ABNORMAL HIGH (ref 0.00–0.07)
Basophils Absolute: 0.1 10*3/uL (ref 0.0–0.1)
Basophils Relative: 1 %
Eosinophils Absolute: 0.4 10*3/uL (ref 0.0–0.5)
Eosinophils Relative: 4 %
HCT: 32.7 % — ABNORMAL LOW (ref 39.0–52.0)
Hemoglobin: 10.4 g/dL — ABNORMAL LOW (ref 13.0–17.0)
Immature Granulocytes: 4 %
Lymphocytes Relative: 22 %
Lymphs Abs: 1.8 10*3/uL (ref 0.7–4.0)
MCH: 28.9 pg (ref 26.0–34.0)
MCHC: 31.8 g/dL (ref 30.0–36.0)
MCV: 90.8 fL (ref 80.0–100.0)
Monocytes Absolute: 1.4 10*3/uL — ABNORMAL HIGH (ref 0.1–1.0)
Monocytes Relative: 17 %
Neutro Abs: 4.3 10*3/uL (ref 1.7–7.7)
Neutrophils Relative %: 52 %
Platelets: 220 10*3/uL (ref 150–400)
RBC: 3.6 MIL/uL — ABNORMAL LOW (ref 4.22–5.81)
RDW: 14.2 % (ref 11.5–15.5)
WBC: 8.2 10*3/uL (ref 4.0–10.5)
nRBC: 0 % (ref 0.0–0.2)

## 2020-07-26 LAB — CULTURE, BLOOD (ROUTINE X 2)
Culture: NO GROWTH
Culture: NO GROWTH
Special Requests: ADEQUATE
Special Requests: ADEQUATE

## 2020-07-26 LAB — IRON AND TIBC
Iron: 18 ug/dL — ABNORMAL LOW (ref 45–182)
Saturation Ratios: 6 % — ABNORMAL LOW (ref 17.9–39.5)
TIBC: 291 ug/dL (ref 250–450)
UIBC: 273 ug/dL

## 2020-07-26 LAB — COMPREHENSIVE METABOLIC PANEL
ALT: 14 U/L (ref 0–44)
AST: 24 U/L (ref 15–41)
Albumin: 2.8 g/dL — ABNORMAL LOW (ref 3.5–5.0)
Alkaline Phosphatase: 61 U/L (ref 38–126)
Anion gap: 8 (ref 5–15)
BUN: 5 mg/dL — ABNORMAL LOW (ref 6–20)
CO2: 20 mmol/L — ABNORMAL LOW (ref 22–32)
Calcium: 8.1 mg/dL — ABNORMAL LOW (ref 8.9–10.3)
Chloride: 105 mmol/L (ref 98–111)
Creatinine, Ser: 1.11 mg/dL (ref 0.61–1.24)
GFR, Estimated: >60 mL/min (ref >60)
Glucose, Bld: 99 mg/dL (ref 70–99)
Potassium: 3.6 mmol/L (ref 3.5–5.1)
Sodium: 133 mmol/L — ABNORMAL LOW (ref 135–145)
Total Bilirubin: 0.6 mg/dL (ref 0.3–1.2)
Total Protein: 6.2 g/dL — ABNORMAL LOW (ref 6.5–8.1)

## 2020-07-26 LAB — FOLATE: Folate: 18 ng/mL (ref >5.9)

## 2020-07-26 LAB — FERRITIN: Ferritin: 34 ng/mL (ref 24–336)

## 2020-07-26 LAB — RETICULOCYTES
Immature Retic Fract: 24.2 % — ABNORMAL HIGH (ref 2.3–15.9)
RBC.: 3.26 MIL/uL — ABNORMAL LOW (ref 4.22–5.81)
Retic Count, Absolute: 94.5 10*3/uL (ref 19.0–186.0)
Retic Ct Pct: 2.9 % (ref 0.4–3.1)

## 2020-07-26 LAB — MAGNESIUM: Magnesium: 1.9 mg/dL (ref 1.7–2.4)

## 2020-07-26 LAB — PHOSPHORUS: Phosphorus: 2.6 mg/dL (ref 2.5–4.6)

## 2020-07-26 LAB — VITAMIN B12: Vitamin B-12: 593 pg/mL (ref 180–914)

## 2020-07-26 IMAGING — CT CT ABD-PELV W/ CM
2 of 5 series · 16 of 46 positions shown, 18 images · IV contrast (APPLIED)
Comparison: [DATE]

CLINICAL DATA: Left lower quadrant pain. Diverticulitis diagnosed 5
days ago.

EXAM:
CT ABDOMEN AND PELVIS WITH CONTRAST
TECHNIQUE: Multidetector CT imaging of the abdomen and pelvis was performed
using the standard protocol following bolus administration of
intravenous contrast.
CONTRAST:  70mL OMNIPAQUE IOHEXOL 300 MG/ML  SOLN

[Series 3: abdomen 5.0 · axial · 0.73mm/px · z∈[-382,-17]mm · 13 of 85 slices shown, 15 images]
[im 6/85  soft-tissue]
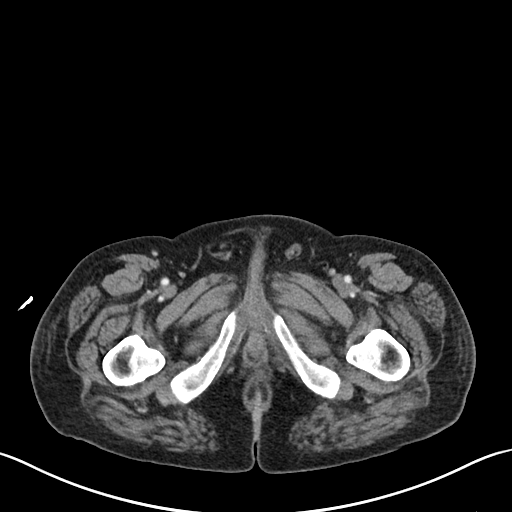
[im 6/85  bone]
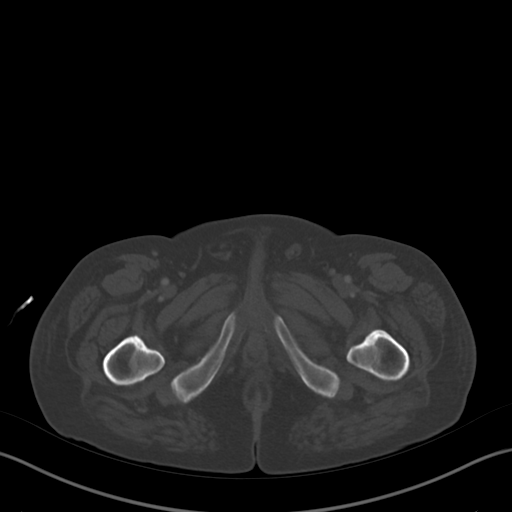
[im 12/85  soft-tissue]
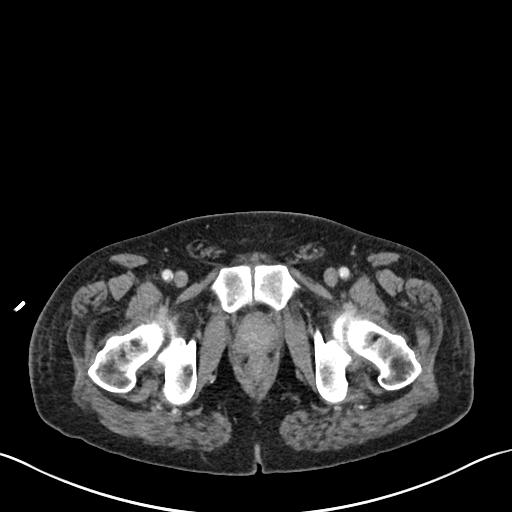
[im 17/85  soft-tissue]
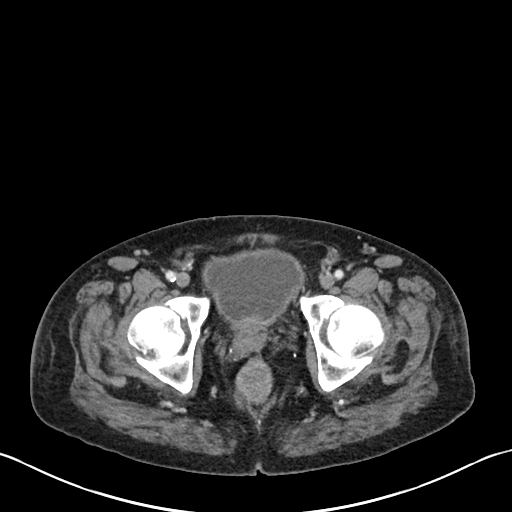
[im 23/85  soft-tissue]
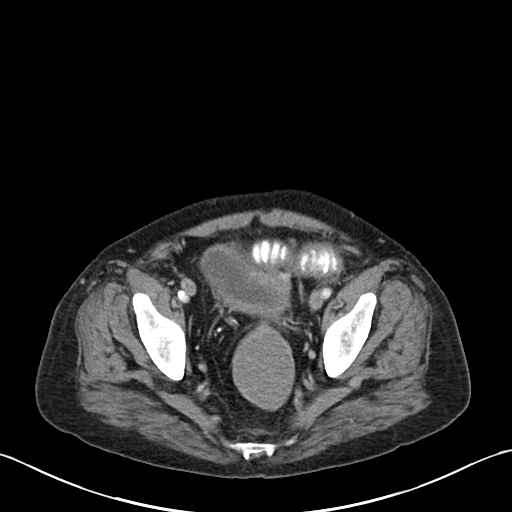
[im 29/85  soft-tissue]
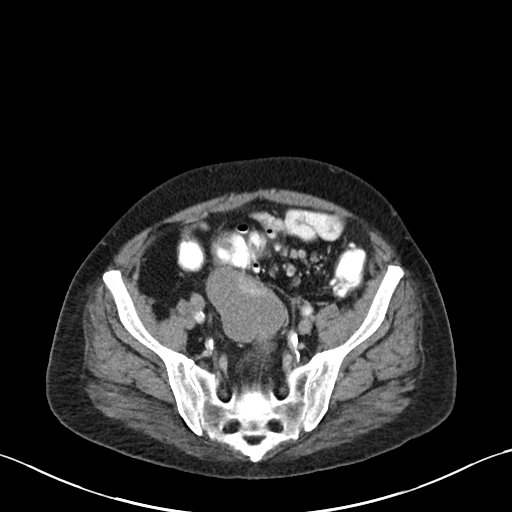
[im 34/85  soft-tissue]
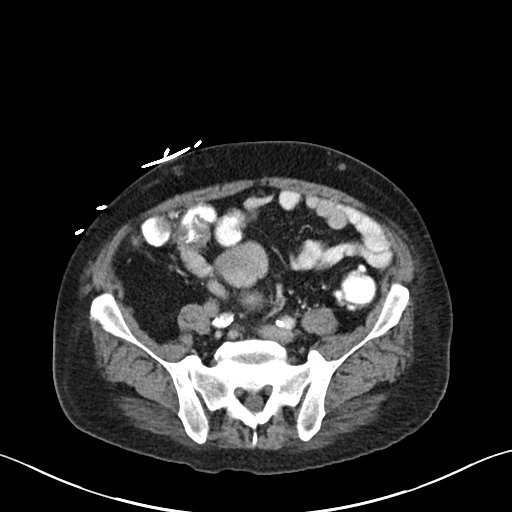
[im 45/85  soft-tissue]
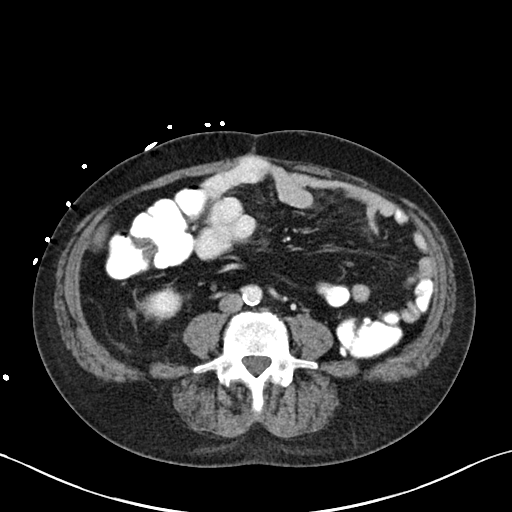
[im 51/85  soft-tissue]
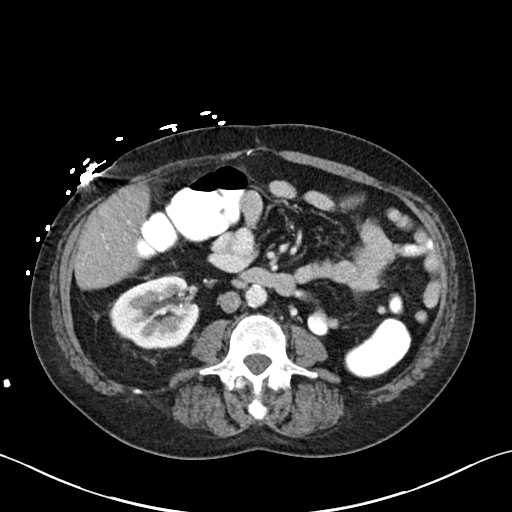
[im 57/85  soft-tissue]
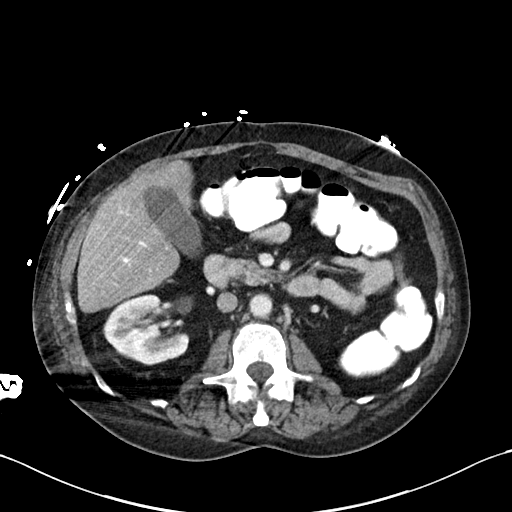
[im 57/85  bone]
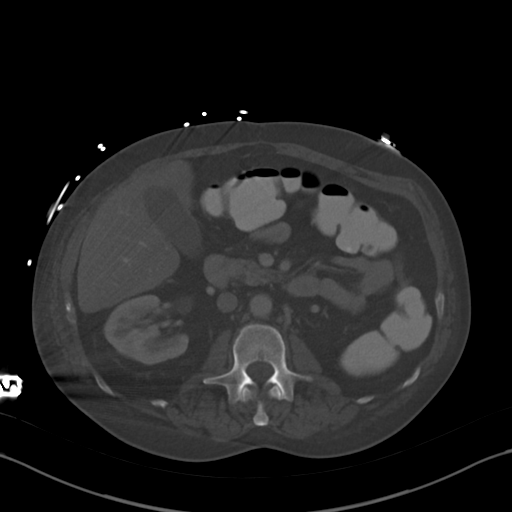
[im 62/85  soft-tissue]
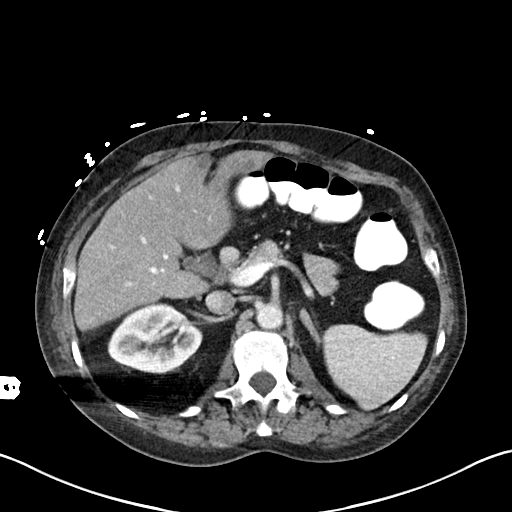
[im 68/85  soft-tissue]
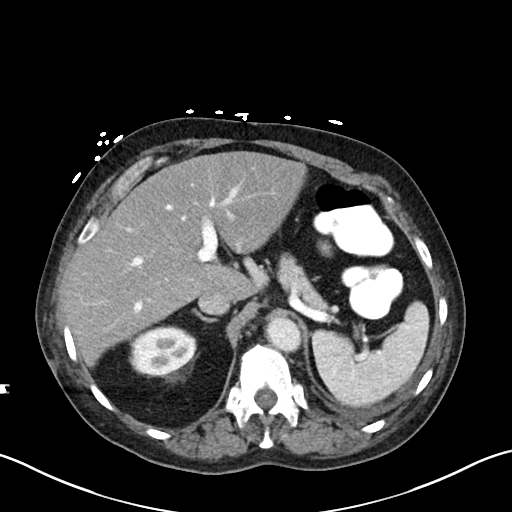
[im 73/85  soft-tissue]
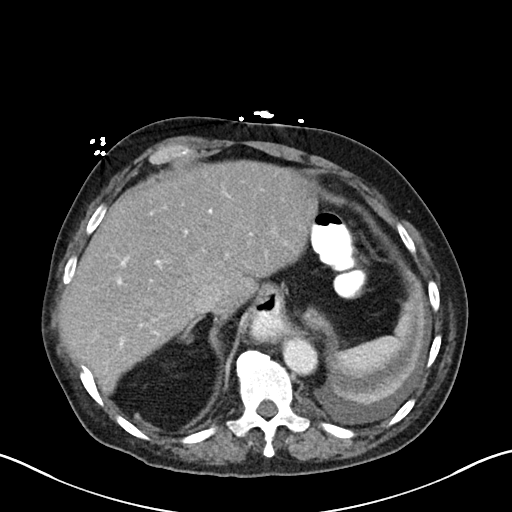
[im 79/85  soft-tissue]
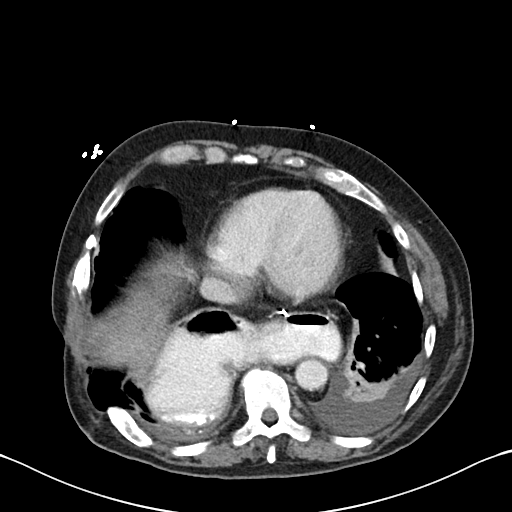

[Series 6: abdomen 3.0 mpr cor · coronal · 0.66mm/px · 3 of 101 slices shown]
[im 34/101  soft-tissue]
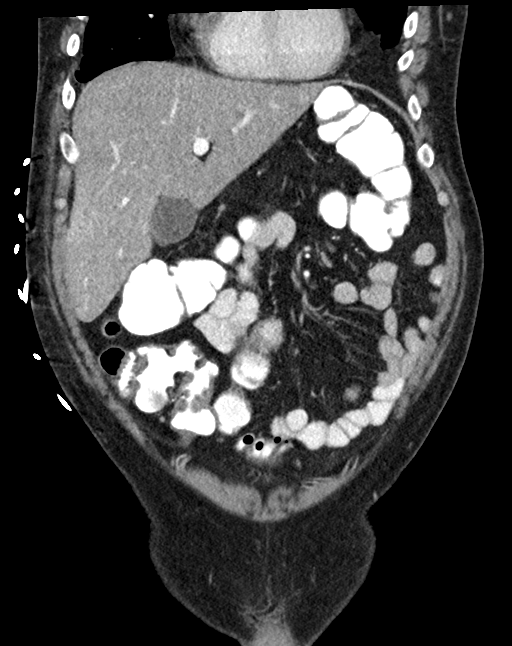
[im 45/101  soft-tissue]
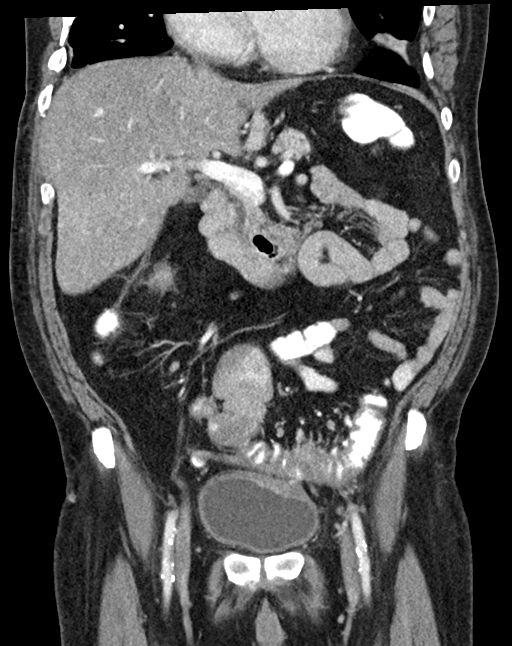
[im 56/101  soft-tissue]
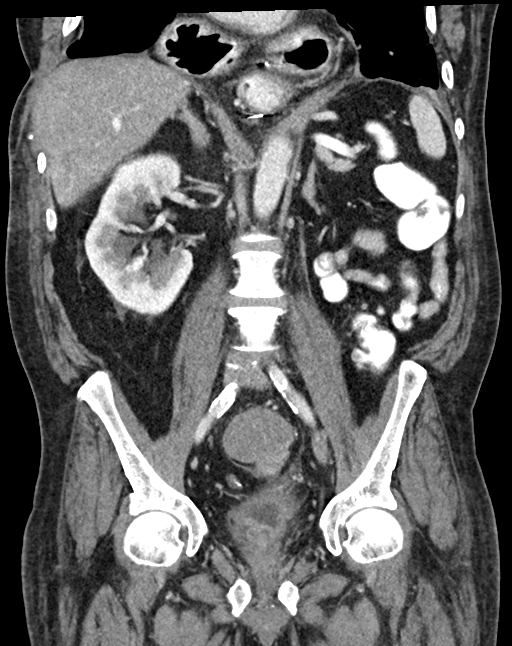

[16 of 46 positions shown; findings below may reference images not displayed]

FINDINGS: Lower chest: Previous gastric pull-through surgery. No sign of
obstruction. Patient has developed a small to moderate size pleural
effusion on the left with dependent atelectasis. There is some
atelectasis of the right lung adjacent to the gastric pull-through.

Hepatobiliary: Fatty change of the liver. No focal parenchymal
pathology. Gallbladder appears unremarkable.

Pancreas: Normal

Spleen: Normal

Adrenals/Urinary Tract: Adrenal glands are normal. Previous left
nephrectomy. Right kidney is normal. No hydroureteronephrosis. No
stone in the bladder.

Stomach/Bowel: No evidence of obstruction of the stomach. Small
bowel appears normal. Pronounced diverticulosis in the sigmoid
region with muscular hypertrophy. Possible minimal changes of
adjacent diverticulitis. No evidence of worsening or development of
abscess or gross perforation.

Vascular/Lymphatic: Aortic atherosclerosis. IVC is normal. No
retroperitoneal adenopathy.

Reproductive: Mildly enlarged prostate. No left seminal vesicle is
identified.

Other: No free fluid or air.

Musculoskeletal: Ordinary lower lumbar degenerative changes.
IMPRESSION: Development of left pleural effusion with dependent pulmonary
atelectasis. Some chronic atelectasis at the right lung base
adjacent to the gastric pull-through.

No evidence of bowel obstruction. Diverticulosis in the left colon
with pronounced muscular hypertrophy in the sigmoid region. Possible
minimal changes of diverticulitis in the adjacent fat, not
progressive in any way since the prior study. No abscess, free fluid
or free air.

## 2020-07-26 MED ORDER — BOOST / RESOURCE BREEZE PO LIQD CUSTOM
1.0000 | Freq: Two times a day (BID) | ORAL | Status: DC
  Administered 2020-07-27 – 2020-07-28 (×2): 1 via ORAL

## 2020-07-26 MED ORDER — IOHEXOL 300 MG/ML  SOLN
70.0000 mL | Freq: Once | INTRAMUSCULAR | Status: AC | PRN
  Administered 2020-07-26: 70 mL via INTRAVENOUS

## 2020-07-26 MED ORDER — ENSURE ENLIVE PO LIQD
237.0000 mL | ORAL | Status: DC
  Administered 2020-07-28: 237 mL via ORAL

## 2020-07-26 MED ORDER — IOHEXOL 9 MG/ML PO SOLN
ORAL | Status: AC
  Filled 2020-07-26: qty 1000

## 2020-07-26 NOTE — Plan of Care (Signed)
  Problem: Coping: Goal: Level of anxiety will decrease Outcome: Progressing   Problem: Education: Goal: Knowledge of General Education information will improve Description: Including pain rating scale, medication(s)/side effects and non-pharmacologic comfort measures Outcome: Progressing   Problem: Activity: Goal: Risk for activity intolerance will decrease Outcome: Progressing   Problem: Nutrition: Goal: Adequate nutrition will be maintained Outcome: Progressing   Problem: Coping: Goal: Level of anxiety will decrease Outcome: Progressing   Problem: Pain Managment: Goal: General experience of comfort will improve Outcome: Progressing

## 2020-07-26 NOTE — Care Management Important Message (Signed)
Important Message  Patient Details  Name: Colin Hamilton MRN: [id number] Date of Birth: [birth year]   Medicare Important Message Given:  Yes     Renie Ora 07/26/2020, 10:45 AM

## 2020-07-26 NOTE — Progress Notes (Signed)
Nutrition Follow Up  DOCUMENTATION CODES:   Severe malnutrition in context of chronic illness  INTERVENTION:    Ensure Enlive po once daily, each supplement provides 350 kcal and 20 grams of protein (in am)  Boost Breeze po BID, each supplement provides 250 kcal and 9 grams of protein  MVI daily   NUTRITION DIAGNOSIS:   Severe Malnutrition related to chronic illness as evidenced by moderate fat depletion,severe fat depletion,moderate muscle depletion,severe muscle depletion.  Ongoing  GOAL:   Patient will meet greater than or equal to 90% of their needs   Progressing   MONITOR:   Diet advancement,PO intake,Supplement acceptance,Weight trends,I & O's  REASON FOR ASSESSMENT:   Malnutrition Screening Tool    ASSESSMENT:   61 yo male with a PMH of esophageal perforation in 2019 status post esophagectomy with gastric pull-through, Barrett esophagus, GERD, HTN, EtOH abuse, asthma, anxiety, and recent admission with sepsis 2/2 to c.diff colitis who presents with sepsis 2/2 colitis and PNA.   Going for CT scan abdomen/pelvis with contrast to determine if patient has colitis/diverticulitis. Currently NPO.   Prior to admission patient would consume small more frequent meals due to ongoing early satiety. States he sticks to softer/easy to digest foods such as noodles and cheese. Typically drinks a muscle milk with an egg mixed in every morning. States appetite this admission is slow to progress given nausea. He drinks Ensure but would like to have a mix of Ensure and Breeze this admission.   Admission weight: 59.5 kg  Current weight: 59.9 kg   UOP: 1350 ml x 24 hrs   Medications: folic acid, thiamine Labs: Na 133 (L)   Diet Order:   Diet Order            DIET SOFT Room service appropriate? Yes; Fluid consistency: Thin  Diet effective ____           Diet NPO time specified  Diet effective now                EDUCATION NEEDS:  Education needs have been  addressed  Skin:  Skin Assessment: Reviewed RN Assessment  Last BM:  5/25  Height:  Ht Readings from Last 1 Encounters:  07/21/20 5\' 8"  (1.727 m)   Weight:  Wt Readings from Last 1 Encounters:  07/25/20 59.9 kg   Ideal Body Weight:  70 kg  BMI:  Body mass index is 20.07 kg/m.  Estimated Nutritional Needs:   Kcal:  2100-2300   Protein:  90-105 grams   Fluid:  >2 L  07/27/20 RD, LDN Clinical Nutrition Pager listed in AMION

## 2020-07-26 NOTE — Progress Notes (Signed)
Please see my note addendum from yesterday for recommendations.

## 2020-07-26 NOTE — Progress Notes (Signed)
[facility]  PROGRESS NOTE  Colin Hamilton ZOX:[id number] DOB: [birth year] DOA: 07/21/2020 PCP: Pcp, No   Brief HPI:   60 year old male with history of anxiety, esophageal perforation in 2019, s/p esophagectomy with gastric pull-through, Barrett's esophagus, hypertension, alcohol abuse, asthma.  Patient was discharged on 06/09/2020 for sepsis due to C. difficile colitis.  Patient has 1 kidney.  Patient had multiple surgeries in 2019 which were performed in [place].  He had abdominal pain which waxes and wanes. He presented to ED for evaluation of cough, shortness of breath, severe left lower quadrant abdominal pain, nausea vomiting and watery diarrhea.  GI was consulted.    Subjective   Patient seen and examined, still complains of severe left flank pain.  Which becomes worse on movement.   Assessment/Plan:     1. Sepsis secondary to colitis-resolved.  Patient presented with severe left flank pain and diarrhea.  He was recently treated for C. difficile colitis with oral vancomycin which completely recovered.  Now he presented with CT scan abdomen concerning for colitis or diverticulitis involving sigmoid colon.  Patient started on cefepime and Flagyl.  Stool culture and C. difficile PCR have been ordered.  C. difficile PCR is currently pending.  GI consulted.  Recommend to check for C. difficile PCR. 2. ?  Pneumonia-patient presented with left flank pain however chest x-ray finding was concerning for pneumonia on left side.  Patient is on cefepime and Flagyl for intra-abdominal infection, also started on azithromycin for pneumonia.  Initial WBC was 35,000, now WBC is 8000.  Patient completed 5-day course of antibiotics on 5/25.  Cefepime and Flagyl has been discontinued as of yesterday. 3. Acute kidney injury-patient's serum creatinine on admission was 1.36 up from 1.16 in April 2022.  Creatinine is down to 1.11.  At baseline. 4. Hypertension-blood pressure is stable, continue metoprolol  12.5 mg p.o. twice daily. 5. Anxiety-continue Seroquel as tolerated. 6. GERD with esophagitis-continue pantoprazole, Carafate. 7. Back/flank pain-CT abdomen pelvis shows developing pleural effusion on left side.  No other intra-abdominal pathology identified.  If patient continues to have back pain, will obtain MRI of lumbar/thoracic spine to rule out disc herniation.  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   Scheduled medications:   . azithromycin  500 mg Oral QHS  . [START ON 07/27/2020] feeding supplement  1 Container Oral BID BM  . [START ON 07/27/2020] feeding supplement  237 mL Oral Q24H  . fluticasone  2 spray Each Nare Daily  . fluticasone furoate-vilanterol  1 puff Inhalation Daily   And  . umeclidinium bromide  1 puff Inhalation Daily  . folic acid  1 mg Oral Daily  . metoprolol tartrate  12.5 mg Oral BID  . multivitamin with minerals  1 tablet Oral Daily  . pantoprazole  40 mg Oral Daily  . QUEtiapine  50 mg Oral QHS  . sucralfate  1 g Oral BID AC  . thiamine  100 mg Oral Daily         Data Reviewed:   CBG:  No results for input(s): GLUCAP in the last 168 hours.  SpO2: 100 %    Vitals:   07/26/20 0828 07/26/20 1229 07/26/20 1500 07/26/20 1623  BP: 125/83 130/86  125/76  Pulse: (!) 108 100 (!) 105 (!) 102  Resp: Temp: 97.8 F (36.6 C) 97.7 F (36.5 C)  98.7 F (37.1 C)  TempSrc: Oral Oral  Oral  SpO2: 96% 97% 96% 100%  Weight:      Height:         Intake/Output Summary (Last 24 hours) at 07/26/2020 1757 Last data filed at 07/26/2020 1600 Gross per 24 hour  Intake 2445.54 ml  Output 2420 ml  Net 25.54 ml    05/24 1901 - 05/26 0700 In: 2469.8 [P.O.:500; I.V.:1569.8] Out: 3450 [Urine:3450]  Filed Weights   07/23/20 0317 07/24/20 0610 07/25/20 0354  Weight: 59.5 kg 59.7 kg 59.9 kg    CBC:  Recent Labs  Lab 07/21/20 1627 07/21/20 1812 07/22/20 0326 07/23/20 0610 07/24/20 0428  07/25/20 1228 07/26/20 0201  WBC 28.0*  --  35.4* 11.3* 8.6 9.2 8.2  HGB 11.6*   < > 11.1* 10.6* 10.5* 9.9* 10.4*  HCT 35.8*   < > 34.1* 33.3* 32.8* 30.8* 32.7*  PLT 279  --  252 221 237 230 220  MCV 89.5  --  90.0 90.7 89.6 89.8 90.8  MCH 29.0  --  29.3 28.9 28.7 28.9 28.9  MCHC 32.4  --  32.6 31.8 32.0 32.1 31.8  RDW 14.1  --  14.2 14.0 13.8 14.1 14.2  LYMPHSABS 0.5*  --   --   --   --  1.4 1.8  MONOABS 2.1*  --   --   --   --  1.4* 1.4*  EOSABS 0.0  --   --   --   --  0.3 0.4  BASOSABS 0.1  --   --   --   --  0.1 0.1   < > = values in this interval not displayed.    Complete metabolic panel:  Recent Labs  Lab 07/21/20 1627 07/21/20 1646 07/21/20 1812 07/21/20 2050 07/21/20 2056 07/21/20 2115 07/22/20 0326 07/23/20 0610 07/24/20 0428 07/25/20 1228 07/25/20 1604 07/26/20 0201  NA 137  --    < >  --   --   --  137 135 133* 134*  --  133*  K 3.6  --    < >  --   --   --  4.2 3.5 3.7 3.2*  --  3.6  CL 104  --    < >  --   --   --  105 107 103 104  --  105  CO2 19*  --   --   --   --   --  --  20*  GLUCOSE 127*  --    < >  --   --   --  122* 93 91 102*  --  99  BUN 10  --    < >  --   --   --  5*  --  5*  CREATININE 1.36*  --    < >  --   --   --  1.16 1.32* 1.21 1.07  --  1.11  CALCIUM 9.3  --   --   --   --   --  8.4* 8.1* 8.0* 8.1*  --  8.1*  AST 35  --   --   --   --   --   --   --  24 19  --  24  ALT 23  --   --   --   --   --   --   --  11 12  --  14  ALKPHOS 90  --   --   --   --   --   --   --  54 63  --  61  BILITOT 1.1  --   --   --   --   --   --   --  0.6 0.7  --  0.6  ALBUMIN 3.5  --   --   --   --   --   --   --  2.7* 2.8*  --  2.8*  MG  --   --   --   --   --   --  1.6*  --  1.8 1.8  --  1.9  PROCALCITON  --   --   --   --  12.14  --  15.98 11.87  --   --   --   --   LATICACIDVEN  --  6.2*  --  5.5*  --   --  4.0*  --   --  1.1 1.3  --   INR  --   --   --   --   --  1.2  --   --   --   --   --   --   TSH  --   --   --   --   --   --   1.364  --   --   --   --   --    < > = values in this interval not displayed.    Recent Labs  Lab 07/21/20 1627  LIPASE 24    Recent Labs  Lab 07/21/20 1700 07/21/20 2056 07/22/20 0326 07/23/20 0610  PROCALCITON  --  12.14 15.98 11.87  SARSCOV2NAA NEGATIVE  --   --   --     ------------------------------------------------------------------------------------------------------------------ No results for input(s): CHOL, HDL, LDLCALC, TRIG, CHOLHDL, LDLDIRECT in the last 72 hours.  Lab Results  Component Value Date   HGBA1C 5.4 06/05/2020   ------------------------------------------------------------------------------------------------------------------ No results for input(s): TSH, T4TOTAL, T3FREE, THYROIDAB in the last 72 hours.  Invalid input(s): FREET3 ------------------------------------------------------------------------------------------------------------------ Recent Labs    07/26/20 1046  VITAMINB12 593  FOLATE 18.0  FERRITIN 34  TIBC 291  IRON 18*  RETICCTPCT 2.9    Coagulation profile Recent Labs  Lab 07/21/20 2115  INR 1.2   No results for input(s): DDIMER in the last 72 hours.  Cardiac Enzymes No results for input(s): CKTOTAL, CKMB, CKMBINDEX, TROPONINI in the last 168 hours.  ------------------------------------------------------------------------------------------------------------------ No results found for: BNP   Antibiotics: Anti-infectives (From admission, onward)   Start     Dose/Rate Route Frequency Ordered Stop   07/25/20 2200  azithromycin (ZITHROMAX) tablet 500 mg        500 mg Oral Daily at bedtime 07/25/20 1505 07/28/20 2159   07/25/20 2200  ceFEPIme (MAXIPIME) 2 g in sodium chloride 0.9 % 100 mL IVPB        2 g 200 mL/hr over 30 Minutes Intravenous Every 12 hours 07/25/20 1507 07/29/20 2159   07/23/20 2200  ceFEPIme (MAXIPIME) 2 g in sodium chloride 0.9 % 100 mL IVPB  Status:  Discontinued        2 g 200 mL/hr over 30 Minutes  Intravenous Every 12 hours 07/23/20 0809 07/25/20 1507   07/23/20 2200  azithromycin (ZITHROMAX) tablet 500 mg  Status:  Discontinued        500 mg Oral Daily at bedtime 07/23/20 0810 07/25/20 1506   07/23/20 1400  metroNIDAZOLE (FLAGYL) tablet 500 mg        500 mg Oral Every 8 hours 07/23/20 0811 07/25/20 2144  07/22/20 1700  azithromycin (ZITHROMAX) 500 mg in sodium chloride 0.9 % 250 mL IVPB  Status:  Discontinued        500 mg 250 mL/hr over 60 Minutes Intravenous Every 24 hours 07/21/20 2011 07/23/20 0810   07/21/20 2045  ceFEPIme (MAXIPIME) 2 g in sodium chloride 0.9 % 100 mL IVPB  Status:  Discontinued        2 g 200 mL/hr over 30 Minutes Intravenous Every 8 hours 07/21/20 2034 07/23/20 0809   07/21/20 2015  metroNIDAZOLE (FLAGYL) IVPB 500 mg  Status:  Discontinued        500 mg 100 mL/hr over 60 Minutes Intravenous Every 8 hours 07/21/20 2011 07/23/20 0811   07/21/20 1700  cefTRIAXone (ROCEPHIN) 1 g in sodium chloride 0.9 % 100 mL IVPB        1 g 200 mL/hr over 30 Minutes Intravenous  Once 07/21/20 1649 07/21/20 1759   07/21/20 1700  azithromycin (ZITHROMAX) 500 mg in sodium chloride 0.9 % 250 mL IVPB        500 mg 250 mL/hr over 60 Minutes Intravenous  Once 07/21/20 1652 07/21/20 1811       Radiology Reports  CT ABDOMEN PELVIS W CONTRAST  Result Date: 07/26/2020 CLINICAL DATA:  Left lower quadrant pain. Diverticulitis diagnosed 5 days ago. EXAM: CT ABDOMEN AND PELVIS WITH CONTRAST TECHNIQUE: Multidetector CT imaging of the abdomen and pelvis was performed using the standard protocol following bolus administration of intravenous contrast. CONTRAST:  23mL OMNIPAQUE IOHEXOL 300 MG/ML  SOLN COMPARISON:  07/21/2020 FINDINGS: Lower chest: Previous gastric pull-through surgery. No sign of obstruction. Patient has developed a small to moderate size pleural effusion on the left with dependent atelectasis. There is some atelectasis of the right lung adjacent to the gastric pull-through.  Hepatobiliary: Fatty change of the liver. No focal parenchymal pathology. Gallbladder appears unremarkable. Pancreas: Normal Spleen: Normal Adrenals/Urinary Tract: Adrenal glands are normal. Previous left nephrectomy. Right kidney is normal. No hydroureteronephrosis. No stone in the bladder. Stomach/Bowel: No evidence of obstruction of the stomach. Small bowel appears normal. Pronounced diverticulosis in the sigmoid region with muscular hypertrophy. Possible minimal changes of adjacent diverticulitis. No evidence of worsening or development of abscess or gross perforation. Vascular/Lymphatic: Aortic atherosclerosis. IVC is normal. No retroperitoneal adenopathy. Reproductive: Mildly enlarged prostate. No left seminal vesicle is identified. Other: No free fluid or air. Musculoskeletal: Ordinary lower lumbar degenerative changes. IMPRESSION: Development of left pleural effusion with dependent pulmonary atelectasis. Some chronic atelectasis at the right lung base adjacent to the gastric pull-through. No evidence of bowel obstruction. Diverticulosis in the left colon with pronounced muscular hypertrophy in the sigmoid region. Possible minimal changes of diverticulitis in the adjacent fat, not progressive in any way since the prior study. No abscess, free fluid or free air. Electronically Signed   By: Paulina Fusi M.D.   On: 07/26/2020 15:25      DVT prophylaxis: SCDs  Code Status: Full code  Family Communication: No family at bedside   Consultants:  Gastroenterology  Procedures:      Objective    Physical Examination:   General-appears in no acute distress Heart-S1-S2, regular, no murmur auscultated Lungs-clear to auscultation bilaterally, no wheezing or crackles auscultated Abdomen-soft, nontender, no organomegaly, positive tenderness in left flank, Extremities-no edema in the lower extremities Neuro-alert, oriented x3, no focal deficit noted  Status is: Inpatient  Dispo: The patient  is from: Home              Anticipated  d/c is to: Skilled nursing facility              Anticipated d/c date is: 07/28/2020              Patient currently not stable for discharge  Barrier to discharge-ongoing evaluation for abdominal pain  COVID-19 Labs  Recent Labs    07/26/20 1046  FERRITIN 34    Lab Results  Component Value Date   SARSCOV2NAA NEGATIVE 07/21/2020   SARSCOV2NAA NEGATIVE 06/05/2020    Microbiology  Recent Results (from the past 240 hour(s))  Culture, blood (x 2)     Status: None   Collection Time: 07/21/20  4:45 PM   Specimen: BLOOD RIGHT HAND  Result Value Ref Range Status   Specimen Description BLOOD RIGHT HAND  Final   Special Requests   Final    BOTTLES DRAWN AEROBIC AND ANAEROBIC Blood Culture adequate volume   Culture   Final    NO GROWTH 5 DAYS Performed at [facility], [address] [place], [place] [address]    Report Status 07/26/2020 FINAL  Final  Urine Culture     Status: Abnormal   Collection Time: 07/21/20  4:46 PM   Specimen: Urine, Random  Result Value Ref Range Status   Specimen Description URINE, RANDOM  Final   Special Requests NONE  Final   Culture (A)  Final    <10,000 COLONIES/mL INSIGNIFICANT GROWTH Performed at [facility], [address], [place], [place] [address]    Report Status 07/23/2020 FINAL  Final  Culture, blood (x 2)     Status: None   Collection Time: 07/21/20  4:50 PM   Specimen: BLOOD LEFT ARM  Result Value Ref Range Status   Specimen Description BLOOD LEFT ARM  Final   Special Requests   Final    BOTTLES DRAWN AEROBIC AND ANAEROBIC Blood Culture adequate volume   Culture   Final    NO GROWTH 5 DAYS Performed at [facility], [address], [place], [place] [address]    Report Status 07/26/2020 FINAL  Final  Resp Panel by RT-PCR (Flu A&B, Covid) Nasopharyngeal Swab     Status: None   Collection Time: 07/21/20  5:00 PM   Specimen: Nasopharyngeal Swab; Nasopharyngeal(NP) swabs in  vial transport medium  Result Value Ref Range Status   SARS Coronavirus 2 by RT PCR NEGATIVE NEGATIVE Final    Comment: (NOTE) SARS-CoV-2 target nucleic acids are NOT DETECTED.  The SARS-CoV-2 RNA is generally detectable in upper respiratory specimens during the acute phase of infection. The lowest concentration of SARS-CoV-2 viral copies this assay can detect is 138 copies/mL. A negative result does not preclude SARS-Cov-2 infection and should not be used as the sole basis for treatment or other patient management decisions. A negative result may occur with  improper specimen collection/handling, submission of specimen other than nasopharyngeal swab, presence of viral mutation(s) within the areas targeted by this assay, and inadequate number of viral copies(<138 copies/mL). A negative result must be combined with clinical observations, patient history, and epidemiological information. The expected result is Negative.  Fact Sheet for Patients:  BloggerCourse.com  Fact Sheet for Healthcare Providers:  SeriousBroker.it  This test is no t yet approved or cleared by the [place] FDA and  has been authorized for detection and/or diagnosis of SARS-CoV-2 by FDA under an Emergency Use Authorization (EUA). This EUA will remain  in effect (meaning this test can be used) for the  duration of the COVID-19 declaration under Section 564(b)(1) of the Act, 21 U.S.C.section 360bbb-3(b)(1), unless the authorization is terminated  or revoked sooner.       Influenza A by PCR NEGATIVE NEGATIVE Final   Influenza B by PCR NEGATIVE NEGATIVE Final    Comment: (NOTE) The Xpert Xpress SARS-CoV-2/FLU/RSV plus assay is intended as an aid in the diagnosis of influenza from Nasopharyngeal swab specimens and should not be used as a sole basis for treatment. Nasal washings and aspirates are unacceptable for Xpert Xpress SARS-CoV-2/FLU/RSV testing.  Fact  Sheet for Patients: BloggerCourse.comhttps://www.fda.gov/media/152166/download  Fact Sheet for Healthcare Providers: SeriousBroker.ithttps://www.fda.gov/media/152162/download  This test is not yet approved or cleared by the [place] FDA and has been authorized for detection and/or diagnosis of SARS-CoV-2 by FDA under an Emergency Use Authorization (EUA). This EUA will remain in effect (meaning this test can be used) for the duration of the COVID-19 declaration under Section 564(b)(1) of the Act, 21 U.S.C. section 360bbb-3(b)(1), unless the authorization is terminated or revoked.  Performed at [facility], [address] [place], [place] [address]           Meredeth IdeGagan S Adisynn Suleiman   [facility] If 7PM-7AM, please contact night-coverage at [email or web address], Office  [phone]   07/26/2020, 5:57 PM  LOS: 5 days

## 2020-07-27 ENCOUNTER — Inpatient Hospital Stay (HOSPITAL_COMMUNITY): Payer: Medicare (Managed Care)

## 2020-07-27 DIAGNOSIS — R110 Nausea: Secondary | ICD-10-CM

## 2020-07-27 DIAGNOSIS — A419 Sepsis, unspecified organism: Secondary | ICD-10-CM | POA: Diagnosis not present

## 2020-07-27 DIAGNOSIS — R1032 Left lower quadrant pain: Secondary | ICD-10-CM | POA: Diagnosis not present

## 2020-07-27 LAB — CBC WITH DIFFERENTIAL/PLATELET
Abs Immature Granulocytes: 0.51 10*3/uL — ABNORMAL HIGH (ref 0.00–0.07)
Basophils Absolute: 0.1 10*3/uL (ref 0.0–0.1)
Basophils Relative: 1 %
Eosinophils Absolute: 0.5 10*3/uL (ref 0.0–0.5)
Eosinophils Relative: 4 %
HCT: 33 % — ABNORMAL LOW (ref 39.0–52.0)
Hemoglobin: 10.6 g/dL — ABNORMAL LOW (ref 13.0–17.0)
Immature Granulocytes: 5 %
Lymphocytes Relative: 17 %
Lymphs Abs: 1.8 10*3/uL (ref 0.7–4.0)
MCH: 29 pg (ref 26.0–34.0)
MCHC: 32.1 g/dL (ref 30.0–36.0)
MCV: 90.2 fL (ref 80.0–100.0)
Monocytes Absolute: 1.5 10*3/uL — ABNORMAL HIGH (ref 0.1–1.0)
Monocytes Relative: 14 %
Neutro Abs: 6.1 10*3/uL (ref 1.7–7.7)
Neutrophils Relative %: 59 %
Platelets: 238 10*3/uL (ref 150–400)
RBC: 3.66 MIL/uL — ABNORMAL LOW (ref 4.22–5.81)
RDW: 14.5 % (ref 11.5–15.5)
WBC: 10.4 10*3/uL (ref 4.0–10.5)
nRBC: 0 % (ref 0.0–0.2)

## 2020-07-27 LAB — COMPREHENSIVE METABOLIC PANEL
ALT: 15 U/L (ref 0–44)
AST: 24 U/L (ref 15–41)
Albumin: 2.9 g/dL — ABNORMAL LOW (ref 3.5–5.0)
Alkaline Phosphatase: 64 U/L (ref 38–126)
Anion gap: 5 (ref 5–15)
BUN: 6 mg/dL (ref 6–20)
CO2: 23 mmol/L (ref 22–32)
Calcium: 8.5 mg/dL — ABNORMAL LOW (ref 8.9–10.3)
Chloride: 107 mmol/L (ref 98–111)
Creatinine, Ser: 1.08 mg/dL (ref 0.61–1.24)
GFR, Estimated: >60 mL/min (ref >60)
Glucose, Bld: 102 mg/dL — ABNORMAL HIGH (ref 70–99)
Potassium: 3.9 mmol/L (ref 3.5–5.1)
Sodium: 135 mmol/L (ref 135–145)
Total Bilirubin: 0.7 mg/dL (ref 0.3–1.2)
Total Protein: 6.5 g/dL (ref 6.5–8.1)

## 2020-07-27 LAB — C DIFFICILE (CDIFF) QUICK SCRN (NO PCR REFLEX)
C Diff antigen: POSITIVE — AB
C Diff toxin: NEGATIVE

## 2020-07-27 LAB — MAGNESIUM: Magnesium: 1.9 mg/dL (ref 1.7–2.4)

## 2020-07-27 LAB — PHOSPHORUS: Phosphorus: 2.5 mg/dL (ref 2.5–4.6)

## 2020-07-27 IMAGING — MR MR THORACIC SPINE WO/W CM
5 of 9 series · 19 of 48 positions shown · IV contrast (gadavist)
Comparison: None available.

CLINICAL DATA: Initial evaluation for severe left lower back/flank
pain.

EXAM:
MRI THORACIC AND LUMBAR SPINE WITHOUT AND WITH CONTRAST
TECHNIQUE: Multiplanar and multiecho pulse sequences of the thoracic and lumbar
spine were obtained without and with intravenous contrast.
CONTRAST:  5.5mL GADAVIST GADOBUTROL 1 MMOL/ML IV SOLN

[Series 18: T1 · sagittal · 3.3mm · 0.62mm/px · 3 of 11 slices shown (1 of 3)]
[im 1/11]
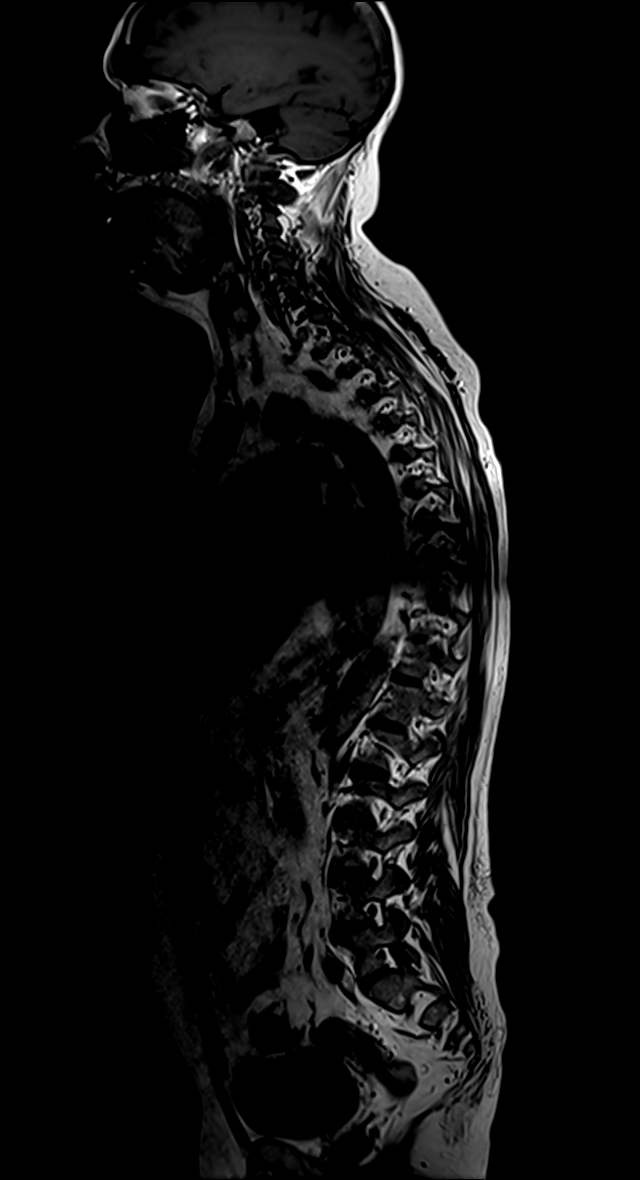
[im 6/11]
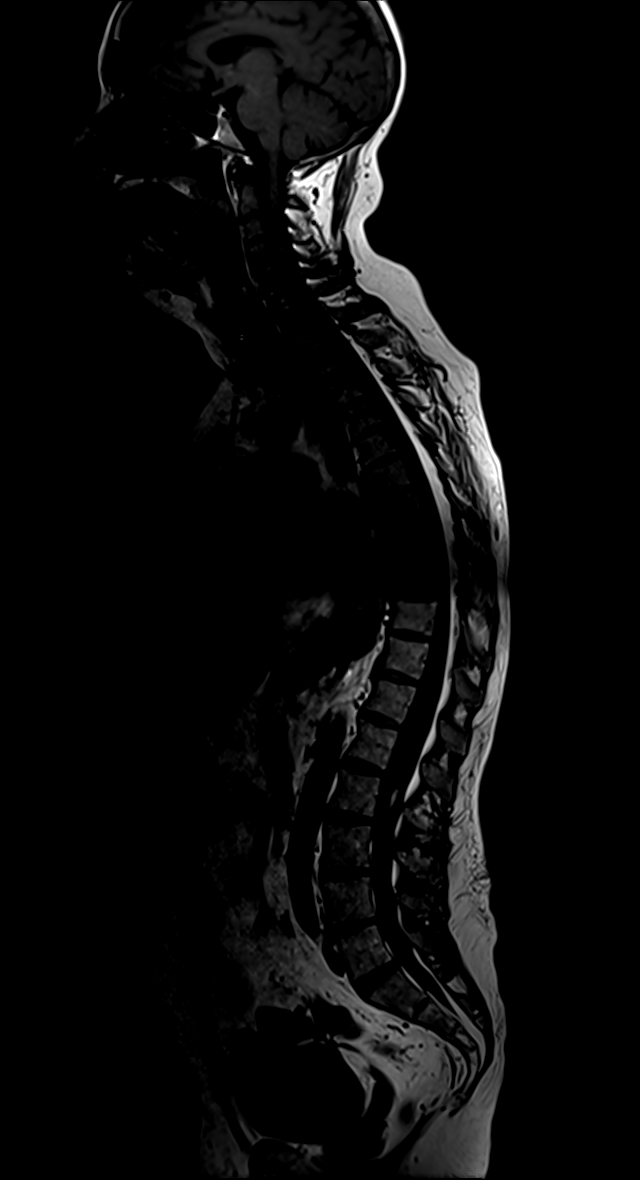
[im 11/11]
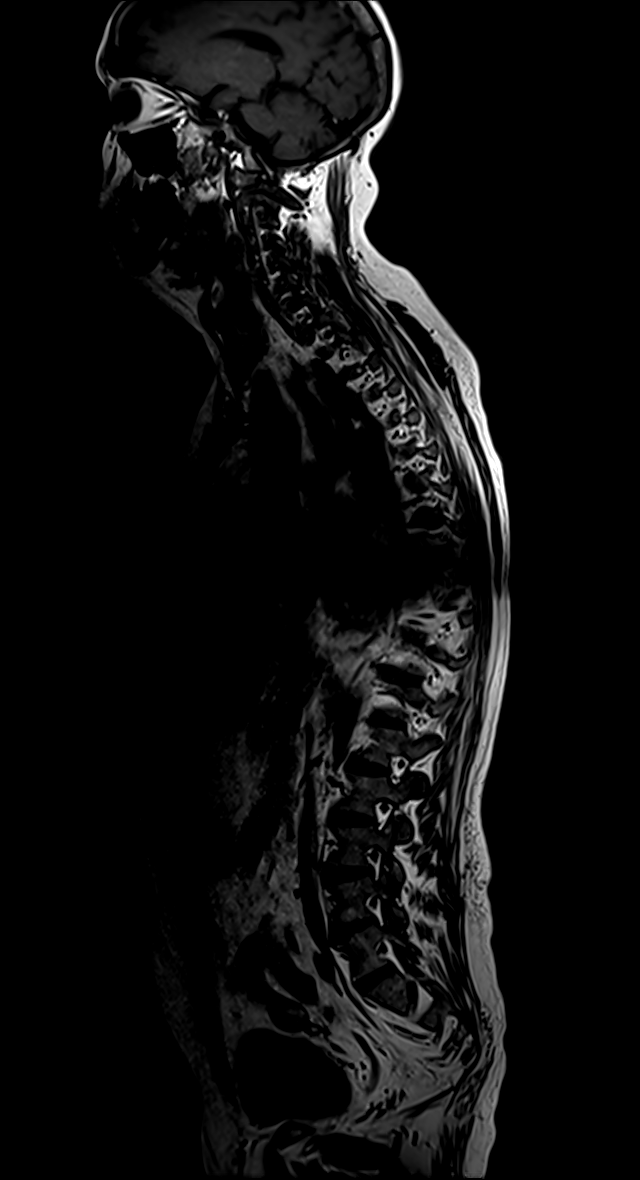

[Series 19: T2 · sagittal · 3.0mm · 0.77mm/px · 4 of 17 slices shown (1 of 2)]
[im 1/17]
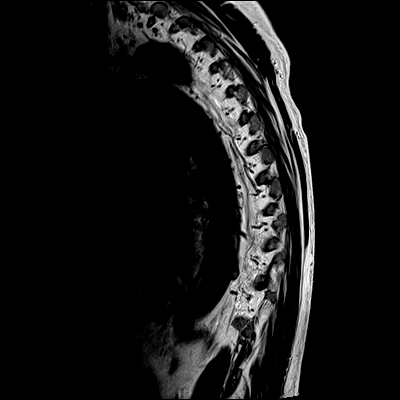
[im 6/17]
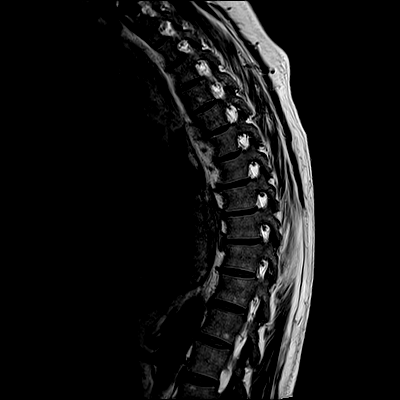
[im 11/17]
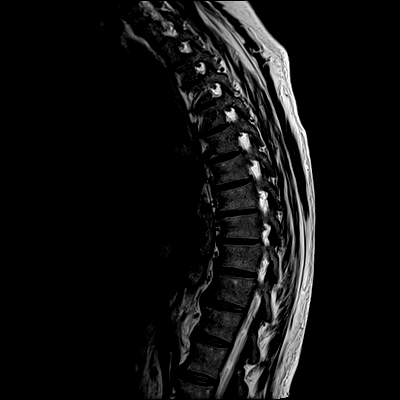
[im 17/17]
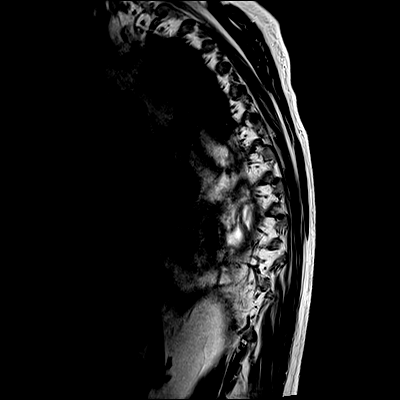

[Series 20: T1 · sagittal · 3.0mm · 0.77mm/px · 3 of 17 slices shown (2 of 3)]
[im 1/17]
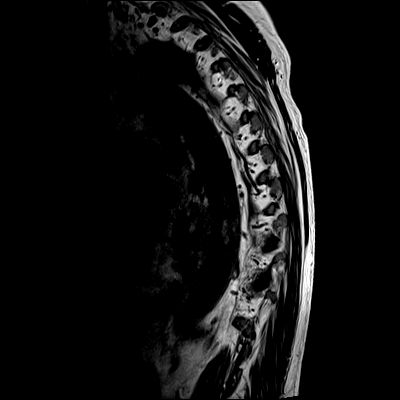
[im 9/17]
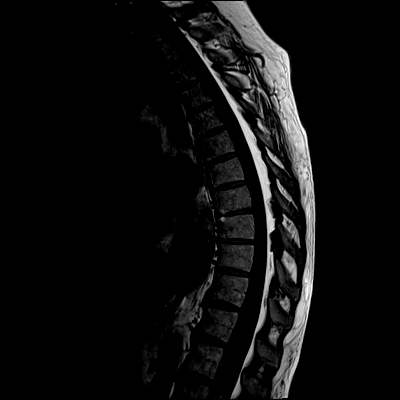
[im 17/17]
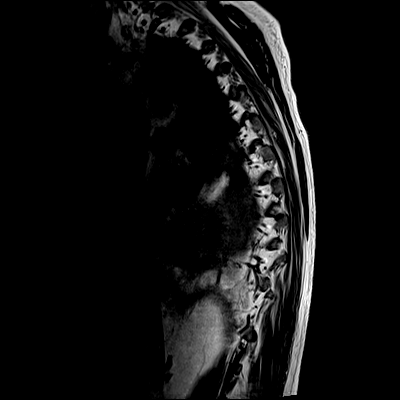

[Series 22: T2 · axial · 5.0mm · 0.59mm/px · z∈[-262,-88]mm · 8 of 39 slices shown (2 of 2)]
[im 1/39]
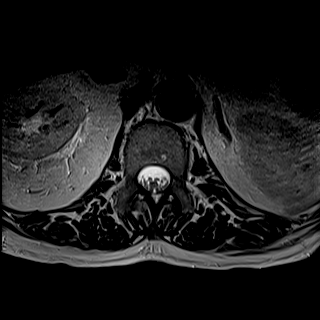
[im 6/39]
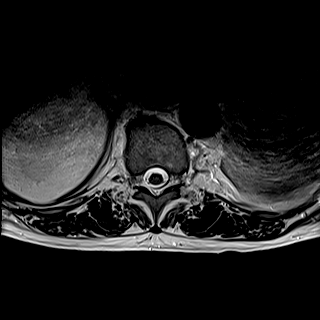
[im 11/39]
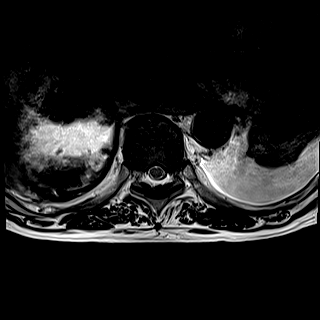
[im 17/39]
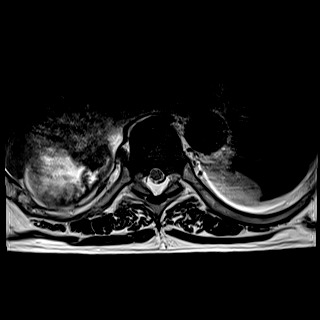
[im 22/39]
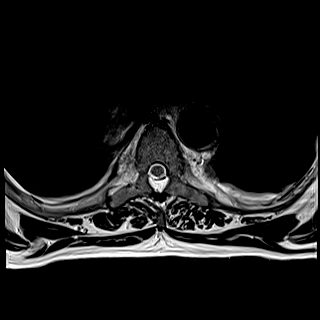
[im 28/39]
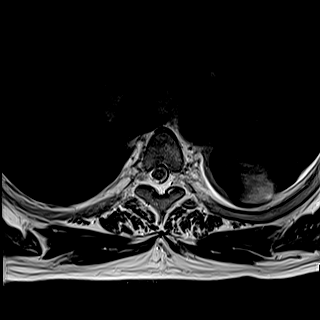
[im 33/39]
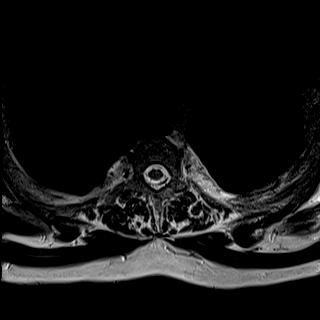
[im 39/39]
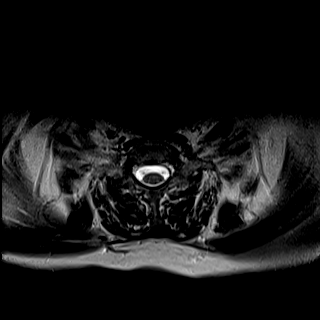

[Series 24: T1 · axial · non-contrast · 5.0mm · 0.31mm/px · 1 of 39 slices shown (3 of 3)]
[im 1/39]
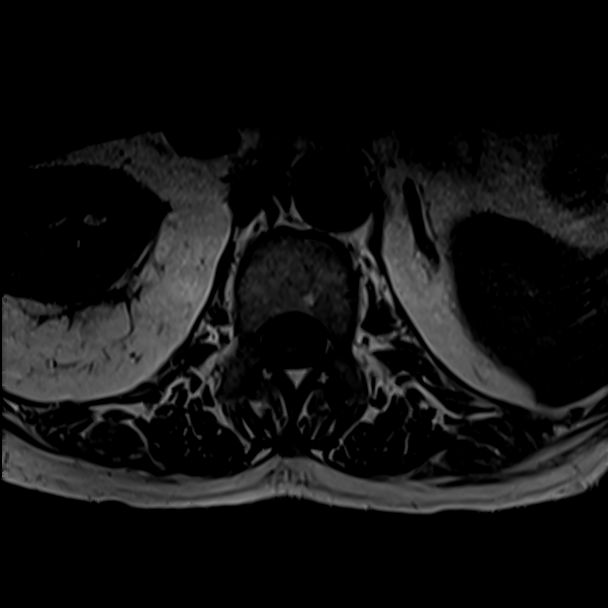

[19 of 48 positions shown; findings below may reference images not displayed]

FINDINGS: MRI THORACIC SPINE FINDINGS

Alignment: Mild exaggeration of the normal thoracic kyphosis. No
listhesis.

Vertebrae: Vertebral body height maintained without acute or chronic
fracture. Bone marrow signal intensity within normal limits. Few
scattered subcentimeter benign hemangiomata noted. No worrisome
osseous lesions. No abnormal marrow edema or enhancement. No
findings to suggest osteomyelitis discitis or septic arthritis.

Cord: Normal signal and morphology. No abnormal enhancement. No
epidural collections. Mild diffuse prominence of the dorsal epidural
fat noted throughout the midthoracic spine.

Paraspinal and other soft tissues: Paraspinous soft tissues within
normal limits. Bilateral pleural effusions, left greater than right.
Sequelae of prior gastric pull-through noted. Associated bibasilar
atelectasis, also seen on prior CT. Visualized visceral structures
otherwise unremarkable.

Disc levels:

Normal expected for age multilevel disc desiccation seen throughout
the thoracic spine. Minimal discogenic reactive endplate change
present about the T7-8 through T9-10 interspaces. No significant
disc bulge or focal disc herniation. No significant facet disease.
No canal or foraminal stenosis or evidence for neural impingement.

MRI LUMBAR SPINE FINDINGS

Segmentation: Standard. Lowest well-formed disc space labeled the
L5-S1 level.

Alignment: Physiologic with preservation of the normal lumbar
lordosis. No listhesis.

Vertebrae: Vertebral body height well maintained without acute or
chronic fracture. Bone marrow signal intensity within normal limits.
Few small benign hemangiomata noted. No worrisome osseous lesions.
No abnormal marrow edema or enhancement. No findings to suggest
osteomyelitis discitis or septic arthritis.

Conus medullaris: Extends to the L1 level and appears normal.
Incidental note made of a small fatty filum terminale. Nerve roots
of the cauda equina otherwise unremarkable.

Paraspinal and other soft tissues: Paraspinous soft tissues
demonstrate no acute finding. Prior left nephrectomy noted.
Visualized visceral structures otherwise unremarkable.

Disc levels:

L1-2:  Unremarkable.

L2-3: Disc desiccation with minimal annular disc bulge. No spinal
stenosis. Foramina remain patent.

L3-4: Degenerative intervertebral disc space narrowing with disc
desiccation and mild disc bulge. Mild facet hypertrophy. No spinal
stenosis. Foramina remain patent.

L4-5: Degenerative intervertebral disc space narrowing with disc
desiccation and mild diffuse disc bulge. Minimal reactive endplate
spurring. Mild facet hypertrophy. No spinal stenosis. Foramina
remain patent.

L5-S1: Disc desiccation with mild disc bulge. Minimal facet
hypertrophy. No canal or lateral recess stenosis. Foramina remain
patent. No impingement.
IMPRESSION: 1. No acute abnormality within the thoracic or lumbar spine. No
evidence for osteomyelitis discitis or septic arthritis.
2. Mild for age degenerative disc disease involving the lumbar spine
without significant stenosis or neural impingement. No findings to
explain patient's symptoms identified.
3. Bilateral pleural effusions, left greater than right. Sequelae of
prior gastric pull-through and left nephrectomy.

## 2020-07-27 IMAGING — MR MR LUMBAR SPINE WO/W CM
4 of 7 series · 25 of 48 positions shown · IV contrast (Gadavist)
Comparison: None available.

CLINICAL DATA: Initial evaluation for severe left lower back/flank
pain.

EXAM:
MRI THORACIC AND LUMBAR SPINE WITHOUT AND WITH CONTRAST
TECHNIQUE: Multiplanar and multiecho pulse sequences of the thoracic and lumbar
spine were obtained without and with intravenous contrast.
CONTRAST:  5.5mL GADAVIST GADOBUTROL 1 MMOL/ML IV SOLN

[Series 1: T2 · sagittal · 4.0mm · 0.77mm/px · 6 of 13 slices shown (1 of 2)]
[im 1/13]
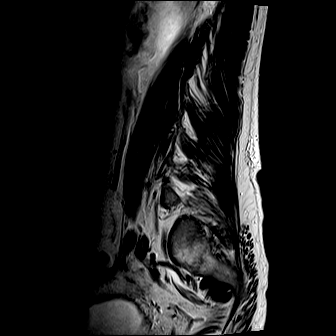
[im 3/13]
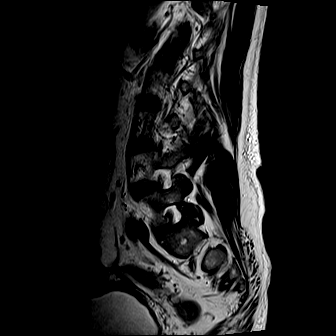
[im 5/13]
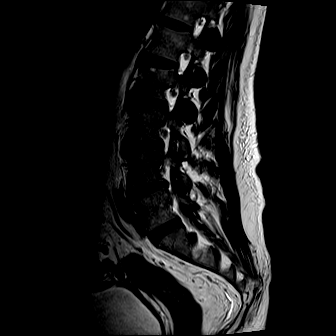
[im 8/13]
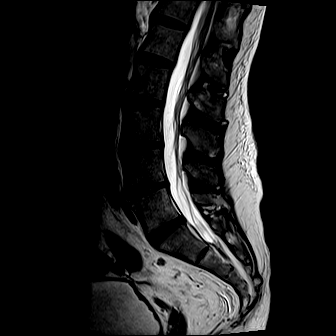
[im 10/13]
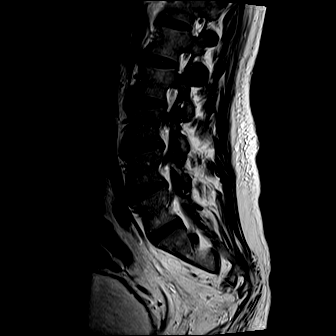
[im 13/13]
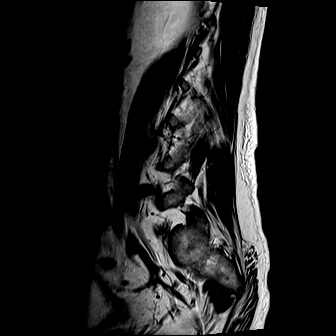

[Series 3: T1 · sagittal · 4.0mm · 0.81mm/px · 5 of 13 slices shown (1 of 2)]
[im 1/13]
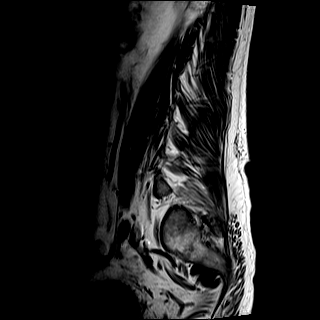
[im 4/13]
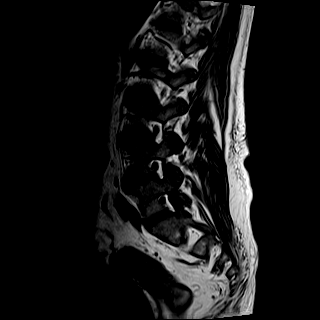
[im 7/13]
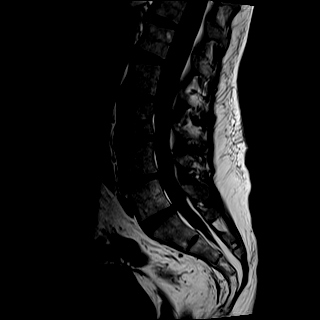
[im 10/13]
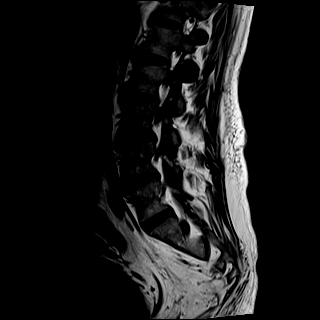
[im 13/13]
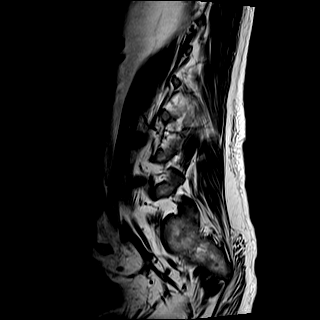

[Series 4: T2 · axial · 5.0mm · 0.62mm/px · z∈[-482,-293]mm · 9 of 23 slices shown (2 of 2)]
[im 1/23]
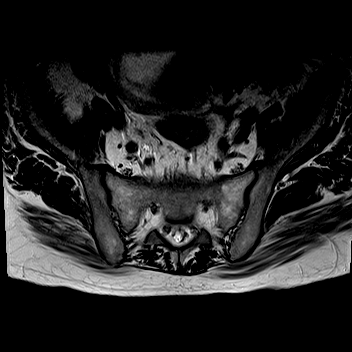
[im 3/23]
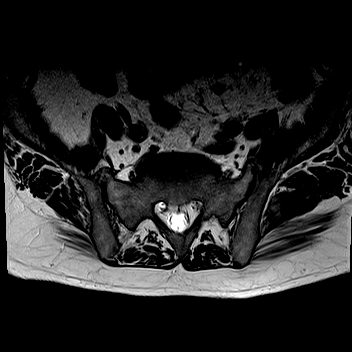
[im 6/23]
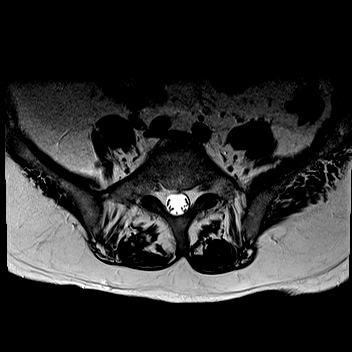
[im 9/23]
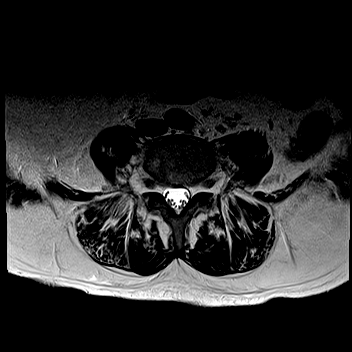
[im 12/23]
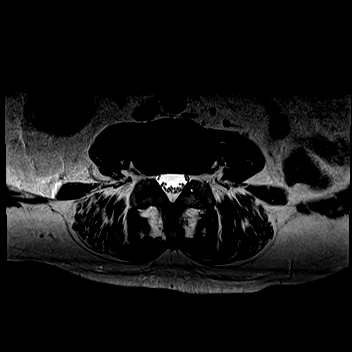
[im 14/23]
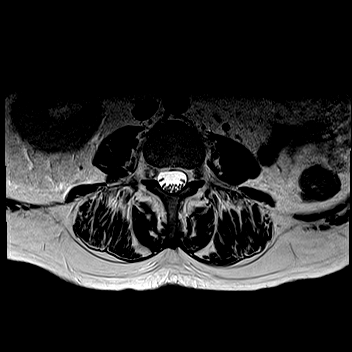
[im 17/23]
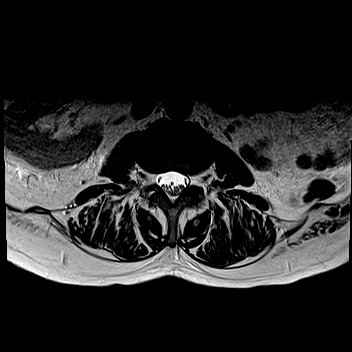
[im 20/23]
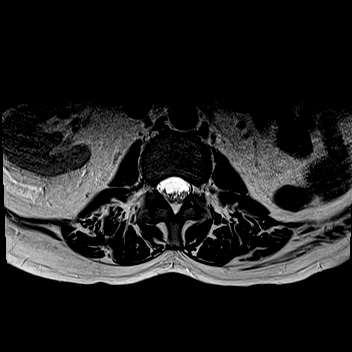
[im 23/23]
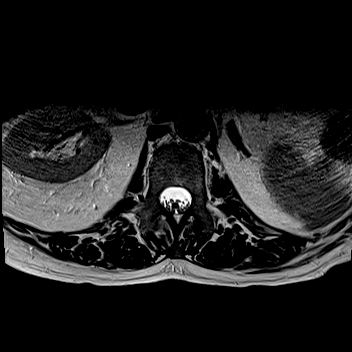

[Series 5: T1 · axial · 5.0mm · 0.34mm/px · z∈[-482,-316]mm · 5 of 23 slices shown (2 of 2)]
[im 1/23]
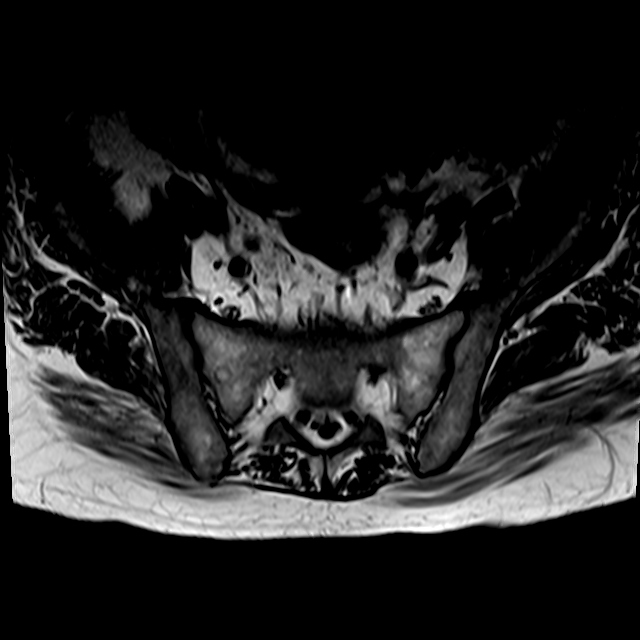
[im 3/23]
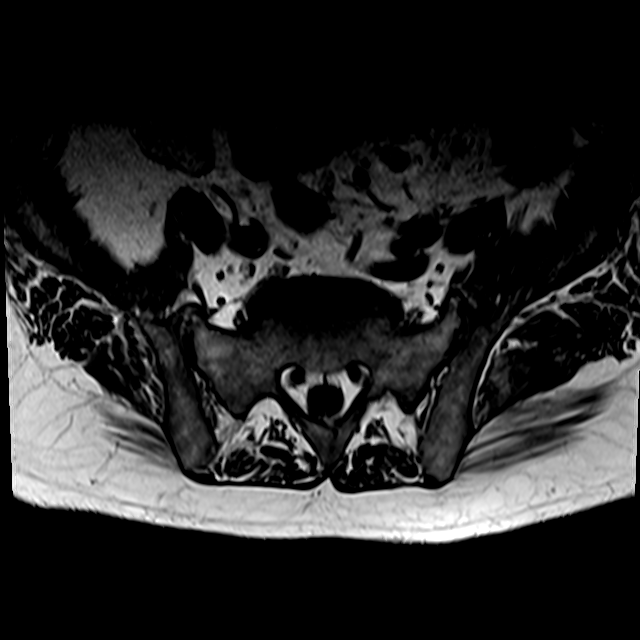
[im 6/23]
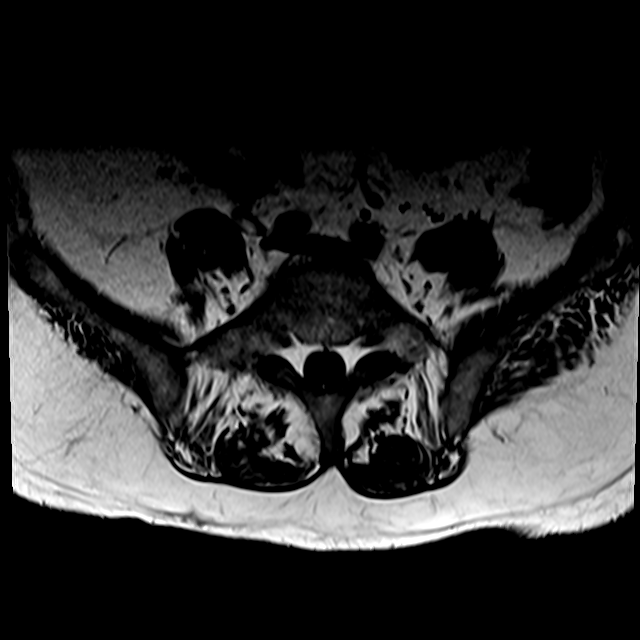
[im 12/23]
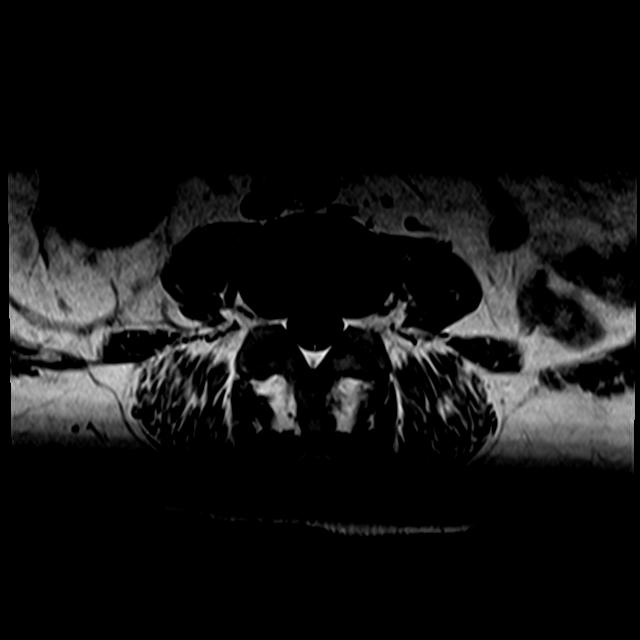
[im 20/23]
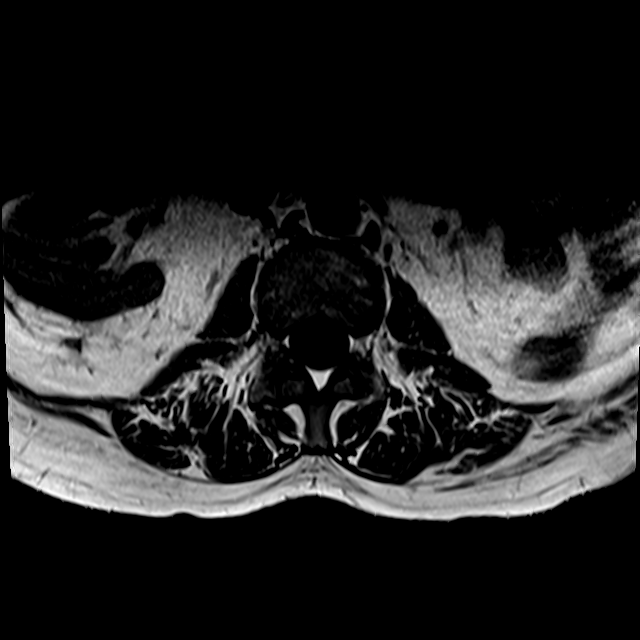

[25 of 48 positions shown; findings below may reference images not displayed]

FINDINGS: MRI THORACIC SPINE FINDINGS

Alignment: Mild exaggeration of the normal thoracic kyphosis. No
listhesis.

Vertebrae: Vertebral body height maintained without acute or chronic
fracture. Bone marrow signal intensity within normal limits. Few
scattered subcentimeter benign hemangiomata noted. No worrisome
osseous lesions. No abnormal marrow edema or enhancement. No
findings to suggest osteomyelitis discitis or septic arthritis.

Cord: Normal signal and morphology. No abnormal enhancement. No
epidural collections. Mild diffuse prominence of the dorsal epidural
fat noted throughout the midthoracic spine.

Paraspinal and other soft tissues: Paraspinous soft tissues within
normal limits. Bilateral pleural effusions, left greater than right.
Sequelae of prior gastric pull-through noted. Associated bibasilar
atelectasis, also seen on prior CT. Visualized visceral structures
otherwise unremarkable.

Disc levels:

Normal expected for age multilevel disc desiccation seen throughout
the thoracic spine. Minimal discogenic reactive endplate change
present about the T7-8 through T9-10 interspaces. No significant
disc bulge or focal disc herniation. No significant facet disease.
No canal or foraminal stenosis or evidence for neural impingement.

MRI LUMBAR SPINE FINDINGS

Segmentation: Standard. Lowest well-formed disc space labeled the
L5-S1 level.

Alignment: Physiologic with preservation of the normal lumbar
lordosis. No listhesis.

Vertebrae: Vertebral body height well maintained without acute or
chronic fracture. Bone marrow signal intensity within normal limits.
Few small benign hemangiomata noted. No worrisome osseous lesions.
No abnormal marrow edema or enhancement. No findings to suggest
osteomyelitis discitis or septic arthritis.

Conus medullaris: Extends to the L1 level and appears normal.
Incidental note made of a small fatty filum terminale. Nerve roots
of the cauda equina otherwise unremarkable.

Paraspinal and other soft tissues: Paraspinous soft tissues
demonstrate no acute finding. Prior left nephrectomy noted.
Visualized visceral structures otherwise unremarkable.

Disc levels:

L1-2:  Unremarkable.

L2-3: Disc desiccation with minimal annular disc bulge. No spinal
stenosis. Foramina remain patent.

L3-4: Degenerative intervertebral disc space narrowing with disc
desiccation and mild disc bulge. Mild facet hypertrophy. No spinal
stenosis. Foramina remain patent.

L4-5: Degenerative intervertebral disc space narrowing with disc
desiccation and mild diffuse disc bulge. Minimal reactive endplate
spurring. Mild facet hypertrophy. No spinal stenosis. Foramina
remain patent.

L5-S1: Disc desiccation with mild disc bulge. Minimal facet
hypertrophy. No canal or lateral recess stenosis. Foramina remain
patent. No impingement.
IMPRESSION: 1. No acute abnormality within the thoracic or lumbar spine. No
evidence for osteomyelitis discitis or septic arthritis.
2. Mild for age degenerative disc disease involving the lumbar spine
without significant stenosis or neural impingement. No findings to
explain patient's symptoms identified.
3. Bilateral pleural effusions, left greater than right. Sequelae of
prior gastric pull-through and left nephrectomy.

## 2020-07-27 MED ORDER — GADOBUTROL 1 MMOL/ML IV SOLN
5.5000 mL | Freq: Once | INTRAVENOUS | Status: AC | PRN
  Administered 2020-07-27: 5.5 mL via INTRAVENOUS

## 2020-07-27 MED ORDER — LORAZEPAM 2 MG/ML IJ SOLN
0.5000 mg | Freq: Once | INTRAMUSCULAR | Status: AC | PRN
  Administered 2020-07-27: 0.5 mg via INTRAVENOUS
  Filled 2020-07-27: qty 1

## 2020-07-27 NOTE — Progress Notes (Signed)
PROGRESS NOTE    Colin Hamilton  JKK:[id number] DOB: 02-18-1961 DOA: 07/21/2020 PCP: Pcp, No    Brief Narrative:  60 year old male with history of anxiety, esophageal perforation in 2019, s/p esophagectomy with gastric pull-through, Barrett's esophagus, hypertension, alcohol abuse, asthma.  Patient was discharged on 06/09/2020 for sepsis due to C. difficile colitis.  Patient has 1 kidney.  Patient had multiple surgeries in 2019 which were performed in [place].  He had abdominal pain which waxes and wanes. He presented to ED for evaluation of cough, shortness of breath, severe left lower quadrant abdominal pain, nausea vomiting and watery diarrhea.  GI was consulted.  Patient has a very complex GI history with severe esophagitis, history of GI bleeds, spontaneous esophageal rupture requiring surgical repair.  GI history dates back to 2018.  As such GI was involved in consultation services.   Assessment & Plan:   Principal Problem:   Sepsis (HCC) Active Problems:   History of alcohol abuse   Essential hypertension   Sigmoid diverticulitis   Irregular cardiac rhythm   Mild renal insufficiency   HCAP (healthcare-associated pneumonia)   Protein-calorie malnutrition, severe  Sepsis secondary to presumed colitis Sepsis physiology has essentially resolved.  Patient presented with severe left flank pain and diarrhea.  He was recently treated with oral vancomycin for C. difficile colitis which essentially recovered and Bob's went back to baseline.  CT imaging is concerning for possible colitis involving sigmoid colon.  Results of the C. difficile assay were also inconclusive.  Patient is a antigen positive and toxin negative indicating a resolved infection.  His bowel habits and clinical presentation are inconsistent with true C. difficile colitis.  He is currently on cefepime for presumed infectious colitis. Plan: Continue cefepime for now Patient may warrant endoscopic evaluation, defer to GI  consultant At this time as clinical presentation does not point to a diagnosis of C. difficile colitis given lack of leukocytosis, fever.  Bowel habits are also not consistent with C. difficile colitis though his complex GI history may cloud this presentation.  May benefit from a trial of oral vancomycin.  Will await formal GI recommendations.  Severe left back/flank pain Unclear etiology.  Does not appear to be improving despite empiric treatment of infectious colitis with IV cefepime.  Is very opioid tolerance.?  Musculoskeletal versus spinal etiology of back pain.  Considering location we will pursue MRI imaging of the back. Plan: MRI lumbar/thoracic spine with and without contrast.  Rule out disc herniation  Acute kidney injury Serum creatinine 1.36 on presentation.  Has recovered to 1.1  Essential hypertension Reasonable control over interval.  Ensure pain control.  Continue metoprolol tartrate 12.5 mg twice daily  Anxiety Continue Seroquel  GERD with associated esophagitis Continue Protonix and Carafate   DVT prophylaxis: SCD Code Status: Full Family Communication: None today Disposition Plan: Status is: Inpatient  Remains inpatient appropriate because:Inpatient level of care appropriate due to severity of illness   Dispo: The patient is from: Home              Anticipated d/c is to: Unclear at this time.  SNF versus home with home health              Patient currently is not medically stable to d/c.   Difficult to place patient No  Ongoing abdominal pain of unclear etiology.  Possible infectious colitis.  Also potentially neuromuscular back pain.  Pursuing advanced imaging for better characterization.     Level of care: Progressive  Consultants:   GI  Procedures:   None  Antimicrobials:   Cefepime   Subjective: Patient seen and examined.  Continues to endorse severe left flank and back pain.  Objective: Vitals:   07/26/20 2000 07/26/20 2300 07/27/20  0412 07/27/20 0811  BP: (!) 142/94 125/81 131/82 121/85  Pulse: 95 98 100 95  Resp:  Temp: 98.6 F (37 C) 98.8 F (37.1 C) 97.7 F (36.5 C) 98 F (36.7 C)  TempSrc: Oral Oral Oral Oral  SpO2:  99%  100%  Weight:   58.8 kg   Height:        Intake/Output Summary (Last 24 hours) at 07/27/2020 1319 Last data filed at 07/27/2020 1237 Gross per 24 hour  Intake 1338.95 ml  Output 3425 ml  Net -2086.05 ml   Filed Weights   07/24/20 0610 07/25/20 0354 07/27/20 0412  Weight: 59.7 kg 59.9 kg 58.8 kg    Examination:  General exam: Mild distress due to pain Respiratory system: Clear to auscultation. Respiratory effort normal. Cardiovascular system: S1 & S2 heard, RRR. No JVD, murmurs, rubs, gallops or clicks. No pedal edema. Gastrointestinal system: Soft, mild tender to palpation epigastrium, nondistended, no guarding or rigidity Central nervous system: Alert and oriented. No focal neurological deficits. Extremities: Symmetric 5 x 5 power. Skin: No rashes, lesions or ulcers Psychiatry: Judgement and insight appear normal. Mood & affect appropriate.     Data Reviewed: I have personally reviewed following labs and imaging studies  CBC: Recent Labs  Lab 07/21/20 1627 07/21/20 1812 07/23/20 0610 07/24/20 0428 07/25/20 1228 07/26/20 0201 07/27/20 0109  WBC 28.0*   < > 11.3* 8.6 9.2 8.2 10.4  NEUTROABS 24.9*  --   --   --  5.8 4.3 6.1  HGB 11.6*   < > 10.6* 10.5* 9.9* 10.4* 10.6*  HCT 35.8*   < > 33.3* 32.8* 30.8* 32.7* 33.0*  MCV 89.5   < > 90.7 89.6 89.8 90.8 90.2  PLT 279   < > 221 237 230 220 238   < > = values in this interval not displayed.   Basic Metabolic Panel: Recent Labs  Lab 07/22/20 0326 07/23/20 0610 07/24/20 0428 07/25/20 1228 07/26/20 0201 07/27/20 0109  NA 137 135 133* 134* 133* 135  K 4.2 3.5 3.7 3.2* 3.6 3.9  CL 105 107 103 104 105 107  CO2 20* 23  GLUCOSE 122* 93 91 102* 99 102*  BUN 5* 5* 6  CREATININE 1.16  1.32* 1.21 1.07 1.11 1.08  CALCIUM 8.4* 8.1* 8.0* 8.1* 8.1* 8.5*  MG 1.6*  --  1.8 1.8 1.9 1.9  PHOS  --   --   --  2.4* 2.6 2.5   GFR: Estimated Creatinine Clearance: 62 mL/min (by C-G formula based on SCr of 1.08 mg/dL). Liver Function Tests: Recent Labs  Lab 07/21/20 1627 07/24/20 0428 07/25/20 1228 07/26/20 0201 07/27/20 0109  AST 35 ALT ALKPHOS 90 54 63 61 64  BILITOT 1.1 0.6 0.7 0.6 0.7  PROT 7.2 6.3* 6.2* 6.2* 6.5  ALBUMIN 3.5 2.7* 2.8* 2.8* 2.9*   Recent Labs  Lab 07/21/20 1627  LIPASE 24   No results for input(s): AMMONIA in the last 168 hours. Coagulation Profile: Recent Labs  Lab 07/21/20 2115  INR 1.2   Cardiac Enzymes: No results for input(s): CKTOTAL, CKMB, CKMBINDEX, TROPONINI in the last 168 hours.  BNP (last 3 results) No results for input(s): PROBNP in the last 8760 hours. HbA1C: No results for input(s): HGBA1C in the last 72 hours. CBG: No results for input(s): GLUCAP in the last 168 hours. Lipid Profile: No results for input(s): CHOL, HDL, LDLCALC, TRIG, CHOLHDL, LDLDIRECT in the last 72 hours. Thyroid Function Tests: No results for input(s): TSH, T4TOTAL, FREET4, T3FREE, THYROIDAB in the last 72 hours. Anemia Panel: Recent Labs    07/26/20 1046  VITAMINB12 593  FOLATE 18.0  FERRITIN 34  TIBC 291  IRON 18*  RETICCTPCT 2.9   Sepsis Labs: Recent Labs  Lab 07/21/20 2050 07/21/20 2056 07/22/20 0326 07/23/20 0610 07/25/20 1228 07/25/20 1604  PROCALCITON  --  12.14 15.98 11.87  --   --   LATICACIDVEN 5.5*  --  4.0*  --  1.1 1.3    Recent Results (from the past 240 hour(s))  Culture, blood (x 2)     Status: None   Collection Time: 07/21/20  4:45 PM   Specimen: BLOOD RIGHT HAND  Result Value Ref Range Status   Specimen Description BLOOD RIGHT HAND  Final   Special Requests   Final    BOTTLES DRAWN AEROBIC AND ANAEROBIC Blood Culture adequate volume   Culture   Final    NO GROWTH 5 DAYS Performed at  [facility], [address] [place], [place] [address]    Report Status 07/26/2020 FINAL  Final  Urine Culture     Status: Abnormal   Collection Time: 07/21/20  4:46 PM   Specimen: Urine, Random  Result Value Ref Range Status   Specimen Description URINE, RANDOM  Final   Special Requests NONE  Final   Culture (A)  Final    <10,000 COLONIES/mL INSIGNIFICANT GROWTH Performed at [facility], [address] [place], [place] [address]    Report Status 07/23/2020 FINAL  Final  Culture, blood (x 2)     Status: None   Collection Time: 07/21/20  4:50 PM   Specimen: BLOOD LEFT ARM  Result Value Ref Range Status   Specimen Description BLOOD LEFT ARM  Final   Special Requests   Final    BOTTLES DRAWN AEROBIC AND ANAEROBIC Blood Culture adequate volume   Culture   Final    NO GROWTH 5 DAYS Performed at [facility], [address] [place], [place] [address]    Report Status 07/26/2020 FINAL  Final  Resp Panel by RT-PCR (Flu A&B, Covid) Nasopharyngeal Swab     Status: None   Collection Time: 07/21/20  5:00 PM   Specimen: Nasopharyngeal Swab; Nasopharyngeal(NP) swabs in vial transport medium  Result Value Ref Range Status   SARS Coronavirus 2 by RT PCR NEGATIVE NEGATIVE Final    Comment: (NOTE) SARS-CoV-2 target nucleic acids are NOT DETECTED.  The SARS-CoV-2 RNA is generally detectable in upper respiratory specimens during the acute phase of infection. The lowest concentration of SARS-CoV-2 viral copies this assay can detect is 138 copies/mL. A negative result does not preclude SARS-Cov-2 infection and should not be used as the sole basis for treatment or other patient management decisions. A negative result may occur with  improper specimen collection/handling, submission of specimen other than nasopharyngeal swab, presence of viral mutation(s) within the areas targeted by this assay, and inadequate number of viral copies(<138 copies/mL). A negative result must be  combined with clinical observations, patient history, and epidemiological information. The expected result is Negative.  Fact Sheet for Patients:  BloggerCourse.com  Fact Sheet for Healthcare Providers:  SeriousBroker.it  This test is no t yet approved or cleared by the [place] FDA and  has been authorized for detection and/or diagnosis of SARS-CoV-2 by FDA under an Emergency Use Authorization (EUA). This EUA will remain  in effect (meaning this test can be used) for the duration of the COVID-19 declaration under Section 564(b)(1) of the Act, 21 U.S.C.section 360bbb-3(b)(1), unless the authorization is terminated  or revoked sooner.       Influenza A by PCR NEGATIVE NEGATIVE Final   Influenza B by PCR NEGATIVE NEGATIVE Final    Comment: (NOTE) The Xpert Xpress SARS-CoV-2/FLU/RSV plus assay is intended as an aid in the diagnosis of influenza from Nasopharyngeal swab specimens and should not be used as a sole basis for treatment. Nasal washings and aspirates are unacceptable for Xpert Xpress SARS-CoV-2/FLU/RSV testing.  Fact Sheet for Patients: BloggerCourse.com  Fact Sheet for Healthcare Providers: SeriousBroker.it  This test is not yet approved or cleared by the [place] FDA and has been authorized for detection and/or diagnosis of SARS-CoV-2 by FDA under an Emergency Use Authorization (EUA). This EUA will remain in effect (meaning this test can be used) for the duration of the COVID-19 declaration under Section 564(b)(1) of the Act, 21 U.S.C. section 360bbb-3(b)(1), unless the authorization is terminated or revoked.  Performed at [facility], [address] [place], [place] [address]   C Difficile Quick Screen (NO PCR Reflex)     Status: Abnormal   Collection Time: 07/25/20  6:54 PM   Specimen: STOOL  Result Value Ref Range Status   C Diff antigen  POSITIVE (A) NEGATIVE Final   C Diff toxin NEGATIVE NEGATIVE Final   C Diff interpretation   Final    Results are indeterminate. Please contact the provider listed for your campus for C diff questions in AMION.    Comment: Performed at [facility], [address], [place], [place] [address]         Radiology Studies: CT ABDOMEN PELVIS W CONTRAST  Result Date: 07/26/2020 CLINICAL DATA:  Left lower quadrant pain. Diverticulitis diagnosed 5 days ago. EXAM: CT ABDOMEN AND PELVIS WITH CONTRAST TECHNIQUE: Multidetector CT imaging of the abdomen and pelvis was performed using the standard protocol following bolus administration of intravenous contrast. CONTRAST:  16mL OMNIPAQUE IOHEXOL 300 MG/ML  SOLN COMPARISON:  07/21/2020 FINDINGS: Lower chest: Previous gastric pull-through surgery. No sign of obstruction. Patient has developed a small to moderate size pleural effusion on the left with dependent atelectasis. There is some atelectasis of the right lung adjacent to the gastric pull-through. Hepatobiliary: Fatty change of the liver. No focal parenchymal pathology. Gallbladder appears unremarkable. Pancreas: Normal Spleen: Normal Adrenals/Urinary Tract: Adrenal glands are normal. Previous left nephrectomy. Right kidney is normal. No hydroureteronephrosis. No stone in the bladder. Stomach/Bowel: No evidence of obstruction of the stomach. Small bowel appears normal. Pronounced diverticulosis in the sigmoid region with muscular hypertrophy. Possible minimal changes of adjacent diverticulitis. No evidence of worsening or development of abscess or gross perforation. Vascular/Lymphatic: Aortic atherosclerosis. IVC is normal. No retroperitoneal adenopathy. Reproductive: Mildly enlarged prostate. No left seminal vesicle is identified. Other: No free fluid or air. Musculoskeletal: Ordinary lower lumbar degenerative changes. IMPRESSION: Development of left pleural effusion with dependent pulmonary atelectasis.  Some chronic atelectasis at the right lung base adjacent to the gastric pull-through. No evidence of bowel obstruction. Diverticulosis in the left colon with pronounced muscular hypertrophy in the sigmoid region.  Possible minimal changes of diverticulitis in the adjacent fat, not progressive in any way since the prior study. No abscess, free fluid or free air. Electronically Signed   By: Paulina FusiMark  Shogry M.D.   On: 07/26/2020 15:25        Scheduled Meds: . azithromycin  500 mg Oral QHS  . feeding supplement  1 Container Oral BID BM  . feeding supplement  237 mL Oral Q24H  . fluticasone  2 spray Each Nare Daily  . fluticasone furoate-vilanterol  1 puff Inhalation Daily   And  . umeclidinium bromide  1 puff Inhalation Daily  . folic acid  1 mg Oral Daily  . metoprolol tartrate  12.5 mg Oral BID  . multivitamin with minerals  1 tablet Oral Daily  . pantoprazole  40 mg Oral Daily  . QUEtiapine  50 mg Oral QHS  . sucralfate  1 g Oral BID AC  . thiamine  100 mg Oral Daily   Continuous Infusions: . sodium chloride 75 mL/hr at 07/27/20 1227  . ceFEPime (MAXIPIME) IV 2 g (07/27/20 0838)     LOS: 6 days    Time spent: 25 minutes    Tresa MooreSudheer B Nirvi Boehler, MD [facility] Pager [phone]  If 7PM-7AM, please contact night-coverage 07/27/2020, 1:19 PM

## 2020-07-27 NOTE — Progress Notes (Addendum)
Daily Rounding Note  07/27/2020, 11:22 AM  LOS: 6 days   SUBJECTIVE:   Chief complaint:   Chronic abd pain   Still having pain in the left flank and abdomen which is chronic.  Had 1 loose stool yesterday, none yet today.  Some nausea. Generally feels unwell.  At baseline has very few days where he feels well.  OBJECTIVE:         Vital signs in last 24 hours:    Temp:  [97.7 F (36.5 C)-98.8 F (37.1 C)] 98 F (36.7 C) (05/27 0811) Pulse Rate:  [95-105] 95 (05/27 0811) Resp:  [16-20] 18 (05/27 0811) BP: (121-142)/(76-94) 121/85 (05/27 0811) SpO2:  [96 %-100 %] 100 % (05/27 0811) Weight:  [58.8 kg] 58.8 kg (05/27 0412) Last BM Date: 07/26/20 Filed Weights   07/24/20 0610 07/25/20 0354 07/27/20 0412  Weight: 59.7 kg 59.9 kg 58.8 kg   General: Looks chronically ill Heart: RRR Chest: No labored breathing.  Hoarse voice.  No cough. Abdomen: Active bowel sounds.  Soft.  No distention.  No abdominal tenderness.  Left flank tenderness present. Extremities:   No CCE. Neuro/Psych: Cooperative.  Seems depressed.  Not confused.  Intake/Output from previous day: 05/26 0701 - 05/27 0700 In: 3684.5 [P.O.:480; I.V.:3104.5; IV Piggyback:100] Out: 3570 [Urine:3570]  Intake/Output this shift: Total I/O In: -  Out: 225 [Urine:225]  Lab Results: Recent Labs    07/25/20 1228 07/26/20 0201 07/27/20 0109  WBC 9.2 8.2 10.4  HGB 9.9* 10.4* 10.6*  HCT 30.8* 32.7* 33.0*  PLT 230 220 238   BMET Recent Labs    07/25/20 1228 07/26/20 0201 07/27/20 0109  NA 134* 133* 135  K 3.2* 3.6 3.9  CL 104 105 107  CO2 23 20* 23  GLUCOSE 102* 99 102*  BUN 5* 5* 6  CREATININE 1.07 1.11 1.08  CALCIUM 8.1* 8.1* 8.5*   LFT Recent Labs    07/25/20 1228 07/26/20 0201 07/27/20 0109  PROT 6.2* 6.2* 6.5  ALBUMIN 2.8* 2.8* 2.9*  AST 19 24 24   ALT 12 14 15   ALKPHOS 63 61 64  BILITOT 0.7 0.6 0.7   PT/INR No results for input(s):  LABPROT, INR in the last 72 hours. Hepatitis Panel No results for input(s): HEPBSAG, HCVAB, HEPAIGM, HEPBIGM in the last 72 hours.  Studies/Results: CT ABDOMEN PELVIS W CONTRAST  Result Date: 07/26/2020 CLINICAL DATA:  Left lower quadrant pain. Diverticulitis diagnosed 5 days ago. EXAM: CT ABDOMEN AND PELVIS WITH CONTRAST TECHNIQUE: Multidetector CT imaging of the abdomen and pelvis was performed using the standard protocol following bolus administration of intravenous contrast. CONTRAST:  40mL OMNIPAQUE IOHEXOL 300 MG/ML  SOLN COMPARISON:  07/21/2020 FINDINGS: Lower chest: Previous gastric pull-through surgery. No sign of obstruction. Patient has developed a small to moderate size pleural effusion on the left with dependent atelectasis. There is some atelectasis of the right lung adjacent to the gastric pull-through. Hepatobiliary: Fatty change of the liver. No focal parenchymal pathology. Gallbladder appears unremarkable. Pancreas: Normal Spleen: Normal Adrenals/Urinary Tract: Adrenal glands are normal. Previous left nephrectomy. Right kidney is normal. No hydroureteronephrosis. No stone in the bladder. Stomach/Bowel: No evidence of obstruction of the stomach. Small bowel appears normal. Pronounced diverticulosis in the sigmoid region with muscular hypertrophy. Possible minimal changes of adjacent diverticulitis. No evidence of worsening or development of abscess or gross perforation. Vascular/Lymphatic: Aortic atherosclerosis. IVC is normal. No retroperitoneal adenopathy. Reproductive: Mildly enlarged prostate. No left seminal vesicle is  identified. Other: No free fluid or air. Musculoskeletal: Ordinary lower lumbar degenerative changes. IMPRESSION: Development of left pleural effusion with dependent pulmonary atelectasis. Some chronic atelectasis at the right lung base adjacent to the gastric pull-through. No evidence of bowel obstruction. Diverticulosis in the left colon with pronounced muscular  hypertrophy in the sigmoid region. Possible minimal changes of diverticulitis in the adjacent fat, not progressive in any way since the prior study. No abscess, free fluid or free air. Electronically Signed   By: Paulina Fusi M.D.   On: 07/26/2020 15:25   Scheduled Meds: . azithromycin  500 mg Oral QHS  . feeding supplement  1 Container Oral BID BM  . feeding supplement  237 mL Oral Q24H  . fluticasone  2 spray Each Nare Daily  . fluticasone furoate-vilanterol  1 puff Inhalation Daily   And  . umeclidinium bromide  1 puff Inhalation Daily  . folic acid  1 mg Oral Daily  . metoprolol tartrate  12.5 mg Oral BID  . multivitamin with minerals  1 tablet Oral Daily  . pantoprazole  40 mg Oral Daily  . QUEtiapine  50 mg Oral QHS  . sucralfate  1 g Oral BID AC  . thiamine  100 mg Oral Daily   Continuous Infusions: . sodium chloride 75 mL/hr at 07/27/20 0305  . ceFEPime (MAXIPIME) IV 2 g (07/27/20 0838)   PRN Meds:.acetaminophen **OR** acetaminophen, albuterol, alum & mag hydroxide-simeth, cyclobenzaprine, diphenhydrAMINE, HYDROcodone-acetaminophen, HYDROmorphone (DILAUDID) injection, ondansetron **OR** ondansetron (ZOFRAN) IV   ASSESMENT:   *   Chronic abdominal pain. 07/26/2020 CTAP w contrast: Left pleural effusion and dependent pulmonary atelectasis and stable chronic right base atelectasis adjacent to gastric pull-through.  Left colon diverticulosis with pronounced muscular hypertrophy in the sigmoid region.  Possible minor changes of diverticulitis in the adjacent fat that has not progressed compared with 07/21/2020 CT.    *    Diarrhea.  C. difficile treated with standard course of vancomycin in 06/2020. C. difficile antigen is positive, C. difficile toxin negative c/w indeterminate results.  C diff PCR not repeated.  Stool PCR panel processing. No significant diarrhea for at least 48 hours, so not clear he has C. Difficile.  *  ? PNA.  Receiving cefepime and oral azithromycin.    *    Hx complicated esophagitis, Barrett's, bleeding.  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  *   Stable anemia of chronic disease.  Low iron, low iron sats, low normal ferritin.  B12, folate okay.  PLAN   *   ?  Add oral vancomycin, will d/w Dr. Myrtie Neither  *   ?  Feraheme infusion.  With all of his GI issues reluctant to initiate oral iron.    Jennye Moccasin  07/27/2020, 11:22 AM Phone [phone]  I have discussed the case with the APP, and that is the plan I formulated. I personally interviewed and examined the patient.  He has multiple somatic complaints today including upset stomach, abdominal pain, left flank pain (what originally brought him in), headache and insomnia.  Stool studies with a positive antigen and negative toxin, no PCR was done.  He no longer has diarrhea, so I do not think he has ongoing C. difficile and I would not advocate retreatment. His diarrhea resolved without medicine that would have covered C. Difficile.  I personally reviewed his CT scan, he has severe left-sided diverticulosis leading to chronic sigmoid wall thickening and an area that the radiologist notes may  have mild focal stranding.  I do not believe he has diverticulitis nor colitis, and I am not currently planning an endoscopic test on him.  Despite a sepsis-like picture when he came in, it is not clear what if any infectious source he had. Blood cultures remain negative, urine culture was contaminant, chest x-ray was not convincing for pneumonia.  And again, I do not believe he had or has diverticulitis or colitis at present.  That area is now seen better on a good oral and IV contrast CT scan done yesterday.  Reviewing the internal medicine note, it sounds like an MRI of his back is planned because of his complaint of severe flank pain with tenderness. He appears back to his baseline chronic digestive symptoms of nausea with intermittent vomiting from his complex foregut  surgical history (symptoms which flare when he has other systemic illness) and left lower quadrant pain from diverticulosis without diverticulitis.  I discontinued the cefepime and azithromycin, have no further GI related test planned while hospitalized, and he can follow-up in our office with Dr. Orvan Falconer.   Charlie Pitter III Office: [phone]

## 2020-07-28 DIAGNOSIS — I10 Essential (primary) hypertension: Secondary | ICD-10-CM | POA: Diagnosis not present

## 2020-07-28 DIAGNOSIS — M546 Pain in thoracic spine: Secondary | ICD-10-CM

## 2020-07-28 LAB — COMPREHENSIVE METABOLIC PANEL
ALT: 15 U/L (ref 0–44)
AST: 22 U/L (ref 15–41)
Albumin: 2.6 g/dL — ABNORMAL LOW (ref 3.5–5.0)
Alkaline Phosphatase: 62 U/L (ref 38–126)
Anion gap: 5 (ref 5–15)
BUN: 7 mg/dL (ref 6–20)
CO2: 23 mmol/L (ref 22–32)
Calcium: 8.5 mg/dL — ABNORMAL LOW (ref 8.9–10.3)
Chloride: 105 mmol/L (ref 98–111)
Creatinine, Ser: 1.27 mg/dL — ABNORMAL HIGH (ref 0.61–1.24)
GFR, Estimated: >60 mL/min (ref >60)
Glucose, Bld: 113 mg/dL — ABNORMAL HIGH (ref 70–99)
Potassium: 3.8 mmol/L (ref 3.5–5.1)
Sodium: 133 mmol/L — ABNORMAL LOW (ref 135–145)
Total Bilirubin: 0.5 mg/dL (ref 0.3–1.2)
Total Protein: 6.3 g/dL — ABNORMAL LOW (ref 6.5–8.1)

## 2020-07-28 LAB — CBC WITH DIFFERENTIAL/PLATELET
Abs Immature Granulocytes: 0.56 10*3/uL — ABNORMAL HIGH (ref 0.00–0.07)
Basophils Absolute: 0.1 10*3/uL (ref 0.0–0.1)
Basophils Relative: 1 %
Eosinophils Absolute: 0.4 10*3/uL (ref 0.0–0.5)
Eosinophils Relative: 4 %
HCT: 31.9 % — ABNORMAL LOW (ref 39.0–52.0)
Hemoglobin: 10.1 g/dL — ABNORMAL LOW (ref 13.0–17.0)
Immature Granulocytes: 6 %
Lymphocytes Relative: 17 %
Lymphs Abs: 1.6 10*3/uL (ref 0.7–4.0)
MCH: 28.5 pg (ref 26.0–34.0)
MCHC: 31.7 g/dL (ref 30.0–36.0)
MCV: 90.1 fL (ref 80.0–100.0)
Monocytes Absolute: 1.1 10*3/uL — ABNORMAL HIGH (ref 0.1–1.0)
Monocytes Relative: 11 %
Neutro Abs: 6.1 10*3/uL (ref 1.7–7.7)
Neutrophils Relative %: 61 %
Platelets: 260 10*3/uL (ref 150–400)
RBC: 3.54 MIL/uL — ABNORMAL LOW (ref 4.22–5.81)
RDW: 14.6 % (ref 11.5–15.5)
WBC: 9.8 10*3/uL (ref 4.0–10.5)
nRBC: 0 % (ref 0.0–0.2)

## 2020-07-28 LAB — MAGNESIUM: Magnesium: 1.8 mg/dL (ref 1.7–2.4)

## 2020-07-28 LAB — PHOSPHORUS: Phosphorus: 2.5 mg/dL (ref 2.5–4.6)

## 2020-07-28 MED ORDER — CYCLOBENZAPRINE HCL 10 MG PO TABS: 10.0000 mg | ORAL_TABLET | Freq: Three times a day (TID) | ORAL | 0 refills | Status: AC | PRN

## 2020-07-28 MED ORDER — FERROUS GLUCONATE 324 (38 FE) MG PO TABS
324.0000 mg | ORAL_TABLET | Freq: Every day | ORAL | Status: DC
  Administered 2020-07-28: 324 mg via ORAL
  Filled 2020-07-28: qty 1

## 2020-07-28 MED ORDER — METOPROLOL TARTRATE 25 MG PO TABS: 12.5000 mg | ORAL_TABLET | Freq: Two times a day (BID) | ORAL | 3 refills | Status: AC

## 2020-07-28 MED ORDER — HYDROMORPHONE HCL 1 MG/ML IJ SOLN
0.5000 mg | Freq: Once | INTRAMUSCULAR | Status: AC
  Administered 2020-07-28: 0.5 mg via INTRAVENOUS
  Filled 2020-07-28: qty 1

## 2020-07-28 MED ORDER — HYDROCODONE-ACETAMINOPHEN 5-325 MG PO TABS: 1.0000 | ORAL_TABLET | Freq: Four times a day (QID) | ORAL | 0 refills | Status: AC | PRN

## 2020-07-28 MED ORDER — FERROUS GLUCONATE 324 (38 FE) MG PO TABS: 324.0000 mg | ORAL_TABLET | Freq: Every day | ORAL | 0 refills | Status: AC

## 2020-07-28 NOTE — Discharge Summary (Signed)
Physician Discharge Summary  Colin Hamilton ZOX:[id number] DOB: Jan 25, 1961 DOA: 07/21/2020  PCP: Pcp, No  Admit date: 07/21/2020 Discharge date: 07/28/2020  Time spent: 50* minutes  Recommendations for Outpatient Follow-up:  1. Follow-up PCP in 2 weeks   Discharge Diagnoses:  Principal Problem:   Sepsis (ruled out. Active Problems:   History of alcohol abuse   Essential hypertension   Sigmoid diverticulitis   Irregular cardiac rhythm   Mild renal insufficiency   HCAP (healthcare-associated pneumonia)   Protein-calorie malnutrition, severe   Discharge Condition: Stable  Diet recommendation: Heart healthy diet  Filed Weights   07/25/20 0354 07/27/20 0412 07/28/20 0601  Weight: 59.9 kg 58.8 kg 59.5 kg    History of present illness:  60 year old male with history of anxiety, esophageal perforation in 2019, s/p esophagectomy with gastric pull-through, Barrett's esophagus, hypertension, alcohol abuse, asthma. Patient was discharged on 06/09/2020 for sepsis due to C. difficile colitis. Patient has 1 kidney. Patient had multiple surgeries in 2019 which were performed in [place]. He had abdominal pain which waxes and wanes. He presented to ED for evaluation of cough, shortness of breath, severe left lower quadrant abdominal pain, nausea vomiting and watery diarrhea. GI was consulted.  Patient has a very complex GI history with severe esophagitis, history of GI bleeds, spontaneous esophageal rupture requiring surgical repair.  GI history dates back to 2018.  As such GI was involved in consultation services.   Hospital Course:  Sepsis ruled out Patient presented with left flank pain, diarrhea.  Initially thought to be in sepsis however all the work-up was negative.  Blood cultures were negative, urine culture grew insignificant growth.  Chest x-ray did not show pneumonia.  Gastroenterology does not feel that patient has diverticulitis or colitis.  Stool for C. difficile showed  positive antigen negative toxin so PCR was not done.  Patient does not have diarrhea.  He was initially started on cefepime which has been discontinued.   Severe left back/flank pain Patient presented with severe left flank back pain.  CT abdomen pelvis did not show any significant abnormality.  MRI of the lumbar and thoracic spine was obtained which showed multilevel disc degeneration but no active changes noted.  Patient's back pain likely from the degenerative disc disease.  Continue Flexeril 10 mg p.o. 3 times daily as needed.  We will also prescribe Vicodin 5/325 1 tablet p.o. every 6 hours as needed.    Acute kidney injury Serum creatinine 1.36 on presentation.  Has recovered to 1.1  Essential hypertension Reasonable control over interval.  Ensure pain control.  Continue metoprolol tartrate 12.5 mg twice daily  Anxiety Continue Seroquel  GERD with associated esophagitis Continue Protonix and Carafate  Iron deficiency anemia Serum iron 18, saturation 6%.  Will start on ferrous gluconate 1 tablet p.o. daily.  Procedures:    Consultations:  Gastroenterology  Discharge Exam: Vitals:   07/28/20 1039 07/28/20 1158  BP: 116/82 129/84  Pulse: 93 99  Resp: 20 16  Temp: 98 F (36.7 C) 98.1 F (36.7 C)  SpO2: 97% 98%    General: Appears in no acute distress Cardiovascular: S1-S2, regular, no murmur auscultated Respiratory: Clear to auscultation bilaterally  Discharge Instructions   Discharge Instructions    Diet - low sodium heart healthy   Complete by: As directed    Increase activity slowly   Complete by: As directed      Allergies as of 07/28/2020      Reactions   Penicillins Swelling  Medication List    STOP taking these medications   famotidine 40 MG tablet Commonly known as: PEPCID     TAKE these medications   albuterol 108 (90 Base) MCG/ACT inhaler Commonly known as: VENTOLIN HFA Inhale 2 puffs into the lungs every 6 (six) hours as  needed for wheezing or shortness of breath.   cyclobenzaprine 10 MG tablet Commonly known as: FLEXERIL Take 1 tablet (10 mg total) by mouth 3 (three) times daily as needed for muscle spasms. What changed:   when to take this  reasons to take this   docusate sodium 100 MG capsule Commonly known as: COLACE Take 100 mg by mouth daily.   ferrous gluconate 324 MG tablet Commonly known as: FERGON Take 1 tablet (324 mg total) by mouth daily with breakfast. Start taking on: Jul 29, 2020   HYDROcodone-acetaminophen 5-325 MG tablet Commonly known as: NORCO/VICODIN Take 1 tablet by mouth every 6 (six) hours as needed for moderate pain.   metoCLOPramide 5 MG tablet Commonly known as: Reglan Take 1 tablet (5 mg total) by mouth every 8 (eight) hours as needed for nausea. What changed: when to take this   metoprolol tartrate 25 MG tablet Commonly known as: LOPRESSOR Take 0.5 tablets (12.5 mg total) by mouth 2 (two) times daily. What changed:   medication strength  how much to take  Another medication with the same name was removed. Continue taking this medication, and follow the directions you see here.   ondansetron 8 MG tablet Commonly known as: ZOFRAN Take 8 mg by mouth every 8 (eight) hours as needed for nausea or vomiting.   pantoprazole 40 MG tablet Commonly known as: PROTONIX Take 40 mg by mouth daily.   QUEtiapine 50 MG tablet Commonly known as: SEROQUEL Take 50 mg by mouth at bedtime. What changed: Another medication with the same name was removed. Continue taking this medication, and follow the directions you see here.   sucralfate 1 g tablet Commonly known as: CARAFATE Take 1 g by mouth 2 (two) times daily before a meal.   Trelegy Ellipta 100-62.5-25 MCG/INH Aepb Generic drug: Fluticasone-Umeclidin-Vilant Inhale 1 puff into the lungs daily.      Allergies  Allergen Reactions  . Penicillins Swelling      The results of significant diagnostics from this  hospitalization (including imaging, microbiology, ancillary and laboratory) are listed below for reference.    Significant Diagnostic Studies: MR THORACIC SPINE W WO CONTRAST  Result Date: 07/27/2020 CLINICAL DATA:  Initial evaluation for severe left lower back/flank pain. EXAM: MRI THORACIC AND LUMBAR SPINE WITHOUT AND WITH CONTRAST TECHNIQUE: Multiplanar and multiecho pulse sequences of the thoracic and lumbar spine were obtained without and with intravenous contrast. CONTRAST:  5.15mL GADAVIST GADOBUTROL 1 MMOL/ML IV SOLN COMPARISON:  None available. FINDINGS: MRI THORACIC SPINE FINDINGS Alignment: Mild exaggeration of the normal thoracic kyphosis. No listhesis. Vertebrae: Vertebral body height maintained without acute or chronic fracture. Bone marrow signal intensity within normal limits. Few scattered subcentimeter benign hemangiomata noted. No worrisome osseous lesions. No abnormal marrow edema or enhancement. No findings to suggest osteomyelitis discitis or septic arthritis. Cord: Normal signal and morphology. No abnormal enhancement. No epidural collections. Mild diffuse prominence of the dorsal epidural fat noted throughout the midthoracic spine. Paraspinal and other soft tissues: Paraspinous soft tissues within normal limits. Bilateral pleural effusions, left greater than right. Sequelae of prior gastric pull-through noted. Associated bibasilar atelectasis, also seen on prior CT. Visualized visceral structures otherwise unremarkable. Disc levels: Normal expected  for age multilevel disc desiccation seen throughout the thoracic spine. Minimal discogenic reactive endplate change present about the T7-8 through T9-10 interspaces. No significant disc bulge or focal disc herniation. No significant facet disease. No canal or foraminal stenosis or evidence for neural impingement. MRI LUMBAR SPINE FINDINGS Segmentation: Standard. Lowest well-formed disc space labeled the L5-S1 level. Alignment: Physiologic with  preservation of the normal lumbar lordosis. No listhesis. Vertebrae: Vertebral body height well maintained without acute or chronic fracture. Bone marrow signal intensity within normal limits. Few small benign hemangiomata noted. No worrisome osseous lesions. No abnormal marrow edema or enhancement. No findings to suggest osteomyelitis discitis or septic arthritis. Conus medullaris: Extends to the L1 level and appears normal. Incidental note made of a small fatty filum terminale. Nerve roots of the cauda equina otherwise unremarkable. Paraspinal and other soft tissues: Paraspinous soft tissues demonstrate no acute finding. Prior left nephrectomy noted. Visualized visceral structures otherwise unremarkable. Disc levels: L1-2:  Unremarkable. L2-3: Disc desiccation with minimal annular disc bulge. No spinal stenosis. Foramina remain patent. L3-4: Degenerative intervertebral disc space narrowing with disc desiccation and mild disc bulge. Mild facet hypertrophy. No spinal stenosis. Foramina remain patent. L4-5: Degenerative intervertebral disc space narrowing with disc desiccation and mild diffuse disc bulge. Minimal reactive endplate spurring. Mild facet hypertrophy. No spinal stenosis. Foramina remain patent. L5-S1: Disc desiccation with mild disc bulge. Minimal facet hypertrophy. No canal or lateral recess stenosis. Foramina remain patent. No impingement. IMPRESSION: 1. No acute abnormality within the thoracic or lumbar spine. No evidence for osteomyelitis discitis or septic arthritis. 2. Mild for age degenerative disc disease involving the lumbar spine without significant stenosis or neural impingement. No findings to explain patient's symptoms identified. 3. Bilateral pleural effusions, left greater than right. Sequelae of prior gastric pull-through and left nephrectomy. Electronically Signed   By: Rise Mu M.D.   On: 07/27/2020 20:21   MR Lumbar Spine W Wo Contrast  Result Date: 07/27/2020 CLINICAL  DATA:  Initial evaluation for severe left lower back/flank pain. EXAM: MRI THORACIC AND LUMBAR SPINE WITHOUT AND WITH CONTRAST TECHNIQUE: Multiplanar and multiecho pulse sequences of the thoracic and lumbar spine were obtained without and with intravenous contrast. CONTRAST:  5.83mL GADAVIST GADOBUTROL 1 MMOL/ML IV SOLN COMPARISON:  None available. FINDINGS: MRI THORACIC SPINE FINDINGS Alignment: Mild exaggeration of the normal thoracic kyphosis. No listhesis. Vertebrae: Vertebral body height maintained without acute or chronic fracture. Bone marrow signal intensity within normal limits. Few scattered subcentimeter benign hemangiomata noted. No worrisome osseous lesions. No abnormal marrow edema or enhancement. No findings to suggest osteomyelitis discitis or septic arthritis. Cord: Normal signal and morphology. No abnormal enhancement. No epidural collections. Mild diffuse prominence of the dorsal epidural fat noted throughout the midthoracic spine. Paraspinal and other soft tissues: Paraspinous soft tissues within normal limits. Bilateral pleural effusions, left greater than right. Sequelae of prior gastric pull-through noted. Associated bibasilar atelectasis, also seen on prior CT. Visualized visceral structures otherwise unremarkable. Disc levels: Normal expected for age multilevel disc desiccation seen throughout the thoracic spine. Minimal discogenic reactive endplate change present about the T7-8 through T9-10 interspaces. No significant disc bulge or focal disc herniation. No significant facet disease. No canal or foraminal stenosis or evidence for neural impingement. MRI LUMBAR SPINE FINDINGS Segmentation: Standard. Lowest well-formed disc space labeled the L5-S1 level. Alignment: Physiologic with preservation of the normal lumbar lordosis. No listhesis. Vertebrae: Vertebral body height well maintained without acute or chronic fracture. Bone marrow signal intensity within normal limits. Few small benign  hemangiomata noted. No worrisome osseous lesions. No abnormal marrow edema or enhancement. No findings to suggest osteomyelitis discitis or septic arthritis. Conus medullaris: Extends to the L1 level and appears normal. Incidental note made of a small fatty filum terminale. Nerve roots of the cauda equina otherwise unremarkable. Paraspinal and other soft tissues: Paraspinous soft tissues demonstrate no acute finding. Prior left nephrectomy noted. Visualized visceral structures otherwise unremarkable. Disc levels: L1-2:  Unremarkable. L2-3: Disc desiccation with minimal annular disc bulge. No spinal stenosis. Foramina remain patent. L3-4: Degenerative intervertebral disc space narrowing with disc desiccation and mild disc bulge. Mild facet hypertrophy. No spinal stenosis. Foramina remain patent. L4-5: Degenerative intervertebral disc space narrowing with disc desiccation and mild diffuse disc bulge. Minimal reactive endplate spurring. Mild facet hypertrophy. No spinal stenosis. Foramina remain patent. L5-S1: Disc desiccation with mild disc bulge. Minimal facet hypertrophy. No canal or lateral recess stenosis. Foramina remain patent. No impingement. IMPRESSION: 1. No acute abnormality within the thoracic or lumbar spine. No evidence for osteomyelitis discitis or septic arthritis. 2. Mild for age degenerative disc disease involving the lumbar spine without significant stenosis or neural impingement. No findings to explain patient's symptoms identified. 3. Bilateral pleural effusions, left greater than right. Sequelae of prior gastric pull-through and left nephrectomy. Electronically Signed   By: Rise MuBenjamin  McClintock M.D.   On: 07/27/2020 20:21   CT ABDOMEN PELVIS W CONTRAST  Result Date: 07/26/2020 CLINICAL DATA:  Left lower quadrant pain. Diverticulitis diagnosed 5 days ago. EXAM: CT ABDOMEN AND PELVIS WITH CONTRAST TECHNIQUE: Multidetector CT imaging of the abdomen and pelvis was performed using the standard  protocol following bolus administration of intravenous contrast. CONTRAST:  70mL OMNIPAQUE IOHEXOL 300 MG/ML  SOLN COMPARISON:  07/21/2020 FINDINGS: Lower chest: Previous gastric pull-through surgery. No sign of obstruction. Patient has developed a small to moderate size pleural effusion on the left with dependent atelectasis. There is some atelectasis of the right lung adjacent to the gastric pull-through. Hepatobiliary: Fatty change of the liver. No focal parenchymal pathology. Gallbladder appears unremarkable. Pancreas: Normal Spleen: Normal Adrenals/Urinary Tract: Adrenal glands are normal. Previous left nephrectomy. Right kidney is normal. No hydroureteronephrosis. No stone in the bladder. Stomach/Bowel: No evidence of obstruction of the stomach. Small bowel appears normal. Pronounced diverticulosis in the sigmoid region with muscular hypertrophy. Possible minimal changes of adjacent diverticulitis. No evidence of worsening or development of abscess or gross perforation. Vascular/Lymphatic: Aortic atherosclerosis. IVC is normal. No retroperitoneal adenopathy. Reproductive: Mildly enlarged prostate. No left seminal vesicle is identified. Other: No free fluid or air. Musculoskeletal: Ordinary lower lumbar degenerative changes. IMPRESSION: Development of left pleural effusion with dependent pulmonary atelectasis. Some chronic atelectasis at the right lung base adjacent to the gastric pull-through. No evidence of bowel obstruction. Diverticulosis in the left colon with pronounced muscular hypertrophy in the sigmoid region. Possible minimal changes of diverticulitis in the adjacent fat, not progressive in any way since the prior study. No abscess, free fluid or free air. Electronically Signed   By: Paulina FusiMark  Shogry M.D.   On: 07/26/2020 15:25   DG Chest Port 1 View  Result Date: 07/21/2020 CLINICAL DATA:  Shortness of breath and chest pain in a 60 year old male. EXAM: PORTABLE CHEST 1 VIEW COMPARISON:  Prior studies  from April of 2022 both CT and chest x-ray. FINDINGS: Image rotated to the LEFT. Accounting for this cardiomediastinal contours are stable. Signs of esophagectomy and gastric pull-through, intrathoracic stomach projects into the LEFT and RIGHT retrocardiac and juxta cardiac regions. Mildly increased interstitial markings  with suspected patchy opacities in the LEFT mid chest. No signs of lobar consolidation. No visible pleural effusion. Signs of RIGHT thoracotomy again noted. IMPRESSION: 1. Mildly increased interstitial markings with suspected patchy opacities in the LEFT mid chest. Findings could reflect developing pneumonitis/infection. 2. Signs of esophagectomy and gastric pull-through. Electronically Signed   By: Donzetta Kohut M.D.   On: 07/21/2020 16:48   CT Renal Stone Study  Result Date: 07/21/2020 CLINICAL DATA:  Flank pain. EXAM: CT ABDOMEN AND PELVIS WITHOUT CONTRAST TECHNIQUE: Multidetector CT imaging of the abdomen and pelvis was performed following the standard protocol without IV contrast. COMPARISON:  June 05, 2020 FINDINGS: Lower chest: Prior gastric pull-through surgery is seen with postoperative changes noted along the posteromedial aspect of the right lung base. Mild areas of bibasilar atelectasis and/or infiltrate are also noted. Hepatobiliary: No focal liver abnormality is seen. No gallstones, gallbladder wall thickening, or biliary dilatation. Pancreas: Unremarkable. No pancreatic ductal dilatation or surrounding inflammatory changes. Spleen: Normal in size without focal abnormality. Adrenals/Urinary Tract: Adrenal glands are unremarkable. The left kidney is absent. The right kidney is normal in size, without renal calculi, focal lesion, or hydronephrosis. Bladder is unremarkable. Stomach/Bowel: Stomach is within normal limits. Appendix appears normal. No evidence of bowel dilatation. Numerous diverticula are seen within the descending and sigmoid colon. Moderate severity thickening of the  proximal to mid sigmoid colon is also seen with a mild amount of stable inflammatory fat stranding seen adjacent to the posterior aspect of the mid sigmoid colon (axial CT images 54 through 59, CT series number 3). Vascular/Lymphatic: Aortic atherosclerosis. No enlarged abdominal or pelvic lymph nodes. Reproductive: Prostate is unremarkable. Other: No abdominal wall hernia or abnormality. No abdominopelvic ascites. Musculoskeletal: No acute or significant osseous findings. IMPRESSION: 1. Findings consistent with colitis and/or diverticulitis involving the proximal to mid sigmoid colon. 2. 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 Electronically Signed   By: Aram Candela M.D.   On: 07/21/2020 19:12    Microbiology: Recent Results (from the past 240 hour(s))  Culture, blood (x 2)     Status: None   Collection Time: 07/21/20  4:45 PM   Specimen: BLOOD RIGHT HAND  Result Value Ref Range Status   Specimen Description BLOOD RIGHT HAND  Final   Special Requests   Final    BOTTLES DRAWN AEROBIC AND ANAEROBIC Blood Culture adequate volume   Culture   Final    NO GROWTH 5 DAYS Performed at [facility], [address], [place], [place] [address]    Report Status 07/26/2020 FINAL  Final  Urine Culture     Status: Abnormal   Collection Time: 07/21/20  4:46 PM   Specimen: Urine, Random  Result Value Ref Range Status   Specimen Description URINE, RANDOM  Final   Special Requests NONE  Final   Culture (A)  Final    <10,000 COLONIES/mL INSIGNIFICANT GROWTH Performed at [facility], [address] [place], [place] [address]    Report Status 07/23/2020 FINAL  Final  Culture, blood (x 2)     Status: None   Collection Time: 07/21/20  4:50 PM   Specimen: BLOOD LEFT ARM  Result Value Ref Range Status   Specimen Description BLOOD LEFT ARM  Final   Special Requests   Final    BOTTLES DRAWN AEROBIC AND ANAEROBIC Blood Culture adequate volume   Culture   Final     NO GROWTH 5 DAYS Performed at [facility], [address] [place], [place] [address]    Report Status 07/26/2020 FINAL  Final  Resp Panel by RT-PCR (Flu A&B, Covid) Nasopharyngeal Swab     Status: None   Collection Time: 07/21/20  5:00 PM   Specimen: Nasopharyngeal Swab; Nasopharyngeal(NP) swabs in vial transport medium  Result Value Ref Range Status   SARS Coronavirus 2 by RT PCR NEGATIVE NEGATIVE Final    Comment: (NOTE) SARS-CoV-2 target nucleic acids are NOT DETECTED.  The SARS-CoV-2 RNA is generally detectable in upper respiratory specimens during the acute phase of infection. The lowest concentration of SARS-CoV-2 viral copies this assay can detect is 138 copies/mL. A negative result does not preclude SARS-Cov-2 infection and should not be used as the sole basis for treatment or other patient management decisions. A negative result may occur with  improper specimen collection/handling, submission of specimen other than nasopharyngeal swab, presence of viral mutation(s) within the areas targeted by this assay, and inadequate number of viral copies(<138 copies/mL). A negative result must be combined with clinical observations, patient history, and epidemiological information. The expected result is Negative.  Fact Sheet for Patients:  BloggerCourse.com  Fact Sheet for Healthcare Providers:  SeriousBroker.it  This test is no t yet approved or cleared by the [place] FDA and  has been authorized for detection and/or diagnosis of SARS-CoV-2 by FDA under an Emergency Use Authorization (EUA). This EUA will remain  in effect (meaning this test can be used) for the duration of the COVID-19 declaration under Section 564(b)(1) of the Act, 21 U.S.C.section 360bbb-3(b)(1), unless the authorization is terminated  or revoked sooner.       Influenza A by PCR NEGATIVE NEGATIVE Final   Influenza B by PCR NEGATIVE NEGATIVE  Final    Comment: (NOTE) The Xpert Xpress SARS-CoV-2/FLU/RSV plus assay is intended as an aid in the diagnosis of influenza from Nasopharyngeal swab specimens and should not be used as a sole basis for treatment. Nasal washings and aspirates are unacceptable for Xpert Xpress SARS-CoV-2/FLU/RSV testing.  Fact Sheet for Patients: BloggerCourse.com  Fact Sheet for Healthcare Providers: SeriousBroker.it  This test is not yet approved or cleared by the [place] FDA and has been authorized for detection and/or diagnosis of SARS-CoV-2 by FDA under an Emergency Use Authorization (EUA). This EUA will remain in effect (meaning this test can be used) for the duration of the COVID-19 declaration under Section 564(b)(1) of the Act, 21 U.S.C. section 360bbb-3(b)(1), unless the authorization is terminated or revoked.  Performed at [facility], [address] [place], [place] [address]   C Difficile Quick Screen (NO PCR Reflex)     Status: Abnormal   Collection Time: 07/25/20  6:54 PM   Specimen: STOOL  Result Value Ref Range Status   C Diff antigen POSITIVE (A) NEGATIVE Final   C Diff toxin NEGATIVE NEGATIVE Final   C Diff interpretation   Final    Results are indeterminate. Please contact the provider listed for your campus for C diff questions in AMION.    Comment: Performed at [facility], [address], [place], [place] [address]     Labs: Basic Metabolic Panel: Recent Labs  Lab 07/24/20 0428 07/25/20 1228 07/26/20 0201 07/27/20 0109 07/28/20 0041  NA 133* 134* 133* 135 133*  K 3.7 3.2* 3.6 3.9 3.8  CL 103 104 105 107 105  CO2 22 23 20* 23 23  GLUCOSE 91 102* 99 102* 113*  BUN 9 5* 5*  6 7  CREATININE 1.21 1.07 1.11 1.08 1.27*  CALCIUM 8.0* 8.1* 8.1* 8.5* 8.5*  MG 1.8 1.8 1.9 1.9 1.8  PHOS  --  2.4* 2.6 2.5 2.5   Liver Function Tests: Recent Labs  Lab 07/24/20 0428 07/25/20 1228 07/26/20 0201  07/27/20 0109 07/28/20 0041  AST 24 19 24 24 22   ALT 11 12 14 15 15   ALKPHOS 54 63 61 64 62  BILITOT 0.6 0.7 0.6 0.7 0.5  PROT 6.3* 6.2* 6.2* 6.5 6.3*  ALBUMIN 2.7* 2.8* 2.8* 2.9* 2.6*   Recent Labs  Lab 07/21/20 1627  LIPASE 24   No results for input(s): AMMONIA in the last 168 hours. CBC: Recent Labs  Lab 07/21/20 1627 07/21/20 1812 07/24/20 0428 07/25/20 1228 07/26/20 0201 07/27/20 0109 07/28/20 0041  WBC 28.0*   < > 8.6 9.2 8.2 10.4 9.8  NEUTROABS 24.9*  --   --  5.8 4.3 6.1 6.1  HGB 11.6*   < > 10.5* 9.9* 10.4* 10.6* 10.1*  HCT 35.8*   < > 32.8* 30.8* 32.7* 33.0* 31.9*  MCV 89.5   < > 89.6 89.8 90.8 90.2 90.1  PLT 279   < > 237 230 220 238 260   < > = values in this interval not displayed.   Cardiac Enzymes: No results for input(s): CKTOTAL, CKMB, CKMBINDEX, TROPONINI in the last 168 hours. BNP: BNP (last 3 results) No results for input(s): BNP in the last 8760 hours.  ProBNP (last 3 results) No results for input(s): PROBNP in the last 8760 hours.  CBG: No results for input(s): GLUCAP in the last 168 hours.     Signed:  07/29/20 MD.  [facility] 07/28/2020, 2:07 PM

## 2020-08-01 LAB — GASTROINTESTINAL PANEL BY PCR, STOOL (REPLACES STOOL CULTURE)
Adenovirus F40/41: NOT DETECTED
Astrovirus: NOT DETECTED
Campylobacter species: NOT DETECTED
Cryptosporidium: NOT DETECTED
Cyclospora cayetanensis: NOT DETECTED
Entamoeba histolytica: NOT DETECTED
Enteroaggregative E coli (EAEC): DETECTED — AB
Enteropathogenic E coli (EPEC): NOT DETECTED
Enterotoxigenic E coli (ETEC): NOT DETECTED
Giardia lamblia: NOT DETECTED
Norovirus GI/GII: NOT DETECTED
Plesimonas shigelloides: NOT DETECTED
Rotavirus A: NOT DETECTED
Salmonella species: DETECTED — AB
Sapovirus (I, II, IV, and V): NOT DETECTED
Shiga like toxin producing E coli (STEC): NOT DETECTED
Shigella/Enteroinvasive E coli (EIEC): NOT DETECTED
Vibrio cholerae: NOT DETECTED
Vibrio species: NOT DETECTED
Yersinia enterocolitica: NOT DETECTED

## 2020-11-19 ENCOUNTER — Ambulatory Visit (HOSPITAL_BASED_OUTPATIENT_CLINIC_OR_DEPARTMENT_OTHER): Payer: No Typology Code available for payment source | Admitting: Family Medicine

## 2021-05-21 ENCOUNTER — Emergency Department (HOSPITAL_BASED_OUTPATIENT_CLINIC_OR_DEPARTMENT_OTHER)
Admission: EM | Admit: 2021-05-21 | Discharge: 2021-05-21 | Disposition: A | Discharge status: Home / Self Care | Payer: Medicare (Managed Care) | Attending: Emergency Medicine | Admitting: Emergency Medicine

## 2021-05-21 ENCOUNTER — Emergency Department (HOSPITAL_BASED_OUTPATIENT_CLINIC_OR_DEPARTMENT_OTHER): Payer: Medicare (Managed Care)

## 2021-05-21 ENCOUNTER — Encounter (HOSPITAL_BASED_OUTPATIENT_CLINIC_OR_DEPARTMENT_OTHER): Payer: Self-pay

## 2021-05-21 ENCOUNTER — Other Ambulatory Visit: Payer: Self-pay

## 2021-05-21 DIAGNOSIS — J45909 Unspecified asthma, uncomplicated: Secondary | ICD-10-CM | POA: Insufficient documentation

## 2021-05-21 DIAGNOSIS — R197 Diarrhea, unspecified: Secondary | ICD-10-CM | POA: Diagnosis not present

## 2021-05-21 DIAGNOSIS — E876 Hypokalemia: Secondary | ICD-10-CM | POA: Insufficient documentation

## 2021-05-21 DIAGNOSIS — L740 Miliaria rubra: Secondary | ICD-10-CM | POA: Insufficient documentation

## 2021-05-21 DIAGNOSIS — Z7951 Long term (current) use of inhaled steroids: Secondary | ICD-10-CM | POA: Insufficient documentation

## 2021-05-21 DIAGNOSIS — Z79899 Other long term (current) drug therapy: Secondary | ICD-10-CM | POA: Diagnosis not present

## 2021-05-21 DIAGNOSIS — R748 Abnormal levels of other serum enzymes: Secondary | ICD-10-CM | POA: Diagnosis not present

## 2021-05-21 DIAGNOSIS — R1907 Generalized intra-abdominal and pelvic swelling, mass and lump: Secondary | ICD-10-CM | POA: Insufficient documentation

## 2021-05-21 DIAGNOSIS — R1110 Vomiting, unspecified: Secondary | ICD-10-CM | POA: Diagnosis not present

## 2021-05-21 DIAGNOSIS — R1084 Generalized abdominal pain: Secondary | ICD-10-CM | POA: Diagnosis present

## 2021-05-21 DIAGNOSIS — I10 Essential (primary) hypertension: Secondary | ICD-10-CM | POA: Diagnosis not present

## 2021-05-21 DIAGNOSIS — M545 Low back pain, unspecified: Secondary | ICD-10-CM | POA: Diagnosis not present

## 2021-05-21 DIAGNOSIS — R7989 Other specified abnormal findings of blood chemistry: Secondary | ICD-10-CM | POA: Insufficient documentation

## 2021-05-21 DIAGNOSIS — R1900 Intra-abdominal and pelvic swelling, mass and lump, unspecified site: Secondary | ICD-10-CM

## 2021-05-21 LAB — CBC
HCT: 37.3 % — ABNORMAL LOW (ref 39.0–52.0)
Hemoglobin: 12.1 g/dL — ABNORMAL LOW (ref 13.0–17.0)
MCH: 27.9 pg (ref 26.0–34.0)
MCHC: 32.4 g/dL (ref 30.0–36.0)
MCV: 86.1 fL (ref 80.0–100.0)
Platelets: 264 10*3/uL (ref 150–400)
RBC: 4.33 MIL/uL (ref 4.22–5.81)
RDW: 15.8 % — ABNORMAL HIGH (ref 11.5–15.5)
WBC: 7.7 10*3/uL (ref 4.0–10.5)
nRBC: 0 % (ref 0.0–0.2)

## 2021-05-21 LAB — URINALYSIS, ROUTINE W REFLEX MICROSCOPIC
Bilirubin Urine: NEGATIVE
Glucose, UA: NEGATIVE mg/dL
Hgb urine dipstick: NEGATIVE
Ketones, ur: NEGATIVE mg/dL
Nitrite: NEGATIVE
Protein, ur: NEGATIVE mg/dL
Specific Gravity, Urine: <1.005 — ABNORMAL LOW (ref 1.005–1.030)
pH: 6.5 (ref 5.0–8.0)

## 2021-05-21 LAB — COMPREHENSIVE METABOLIC PANEL
ALT: 5 U/L (ref 0–44)
AST: 13 U/L — ABNORMAL LOW (ref 15–41)
Albumin: 4.3 g/dL (ref 3.5–5.0)
Alkaline Phosphatase: 78 U/L (ref 38–126)
Anion gap: 18 — ABNORMAL HIGH (ref 5–15)
BUN: 7 mg/dL (ref 6–20)
CO2: 18 mmol/L — ABNORMAL LOW (ref 22–32)
Calcium: 9.1 mg/dL (ref 8.9–10.3)
Chloride: 99 mmol/L (ref 98–111)
Creatinine, Ser: 1.31 mg/dL — ABNORMAL HIGH (ref 0.61–1.24)
GFR, Estimated: >60 mL/min (ref >60)
Glucose, Bld: 101 mg/dL — ABNORMAL HIGH (ref 70–99)
Potassium: 2.9 mmol/L — ABNORMAL LOW (ref 3.5–5.1)
Sodium: 135 mmol/L (ref 135–145)
Total Bilirubin: 0.3 mg/dL (ref 0.3–1.2)
Total Protein: 7.9 g/dL (ref 6.5–8.1)

## 2021-05-21 LAB — PROTIME-INR
INR: 1 (ref 0.8–1.2)
Prothrombin Time: 13.5 seconds (ref 11.4–15.2)

## 2021-05-21 LAB — LIPASE, BLOOD: Lipase: 54 U/L — ABNORMAL HIGH (ref 11–51)

## 2021-05-21 LAB — APTT: aPTT: 30 seconds (ref 24–36)

## 2021-05-21 IMAGING — CT CT CHEST-ABD-PELV W/ CM
2 of 5 series · 12 of 46 positions shown, 14 images · IV contrast (APPLIED)
Comparison: CT abdomen pelvis [DATE], CT abdomen pelvis
[DATE], CT abdomen pelvis [DATE]

CLINICAL DATA: back/abdominal pain with retroperitoneal bruising.
Recent esophageal perforation and hospitalization.

EXAM:
CT CHEST, ABDOMEN, AND PELVIS WITH CONTRAST
TECHNIQUE: Multidetector CT imaging of the chest, abdomen and pelvis was
performed following the standard protocol during bolus
administration of intravenous contrast.

[Series 2: cap with · axial · 0.66mm/px · z∈[+899,+1429]mm · 9 of 126 slices shown, 11 images]
[im 10/126  soft-tissue]
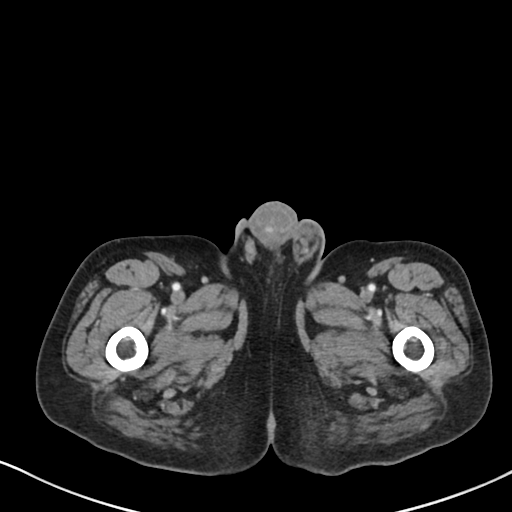
[im 10/126  bone]
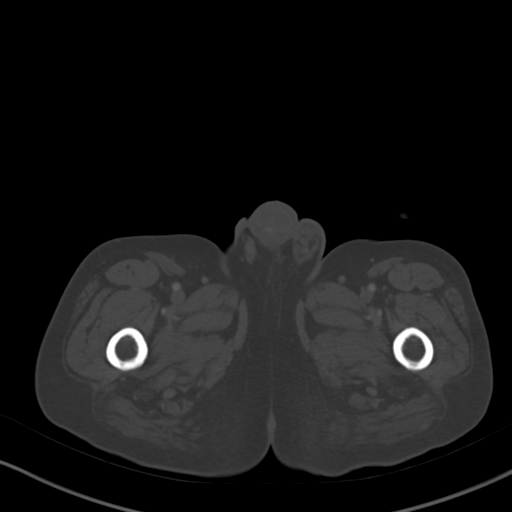
[im 29/126  soft-tissue]
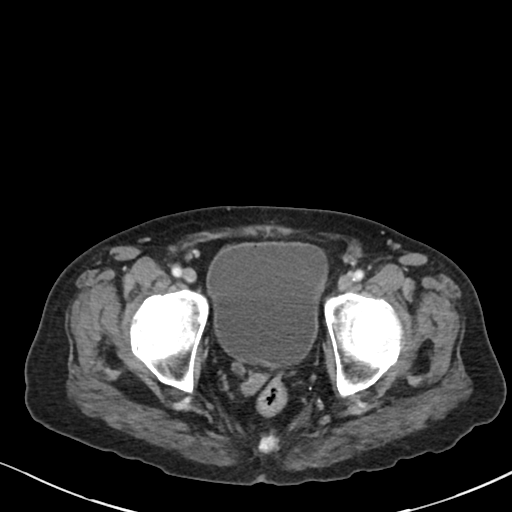
[im 39/126  soft-tissue]
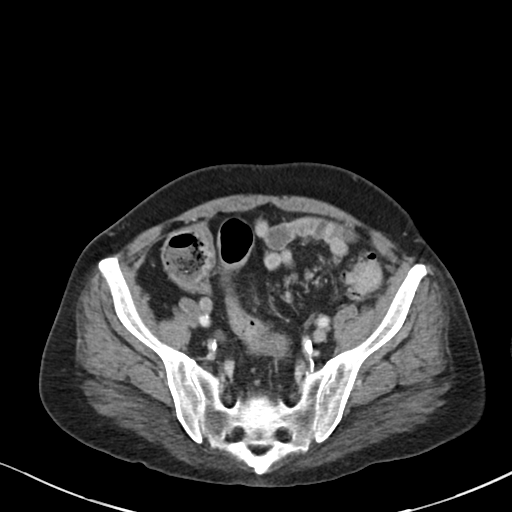
[im 49/126  soft-tissue]
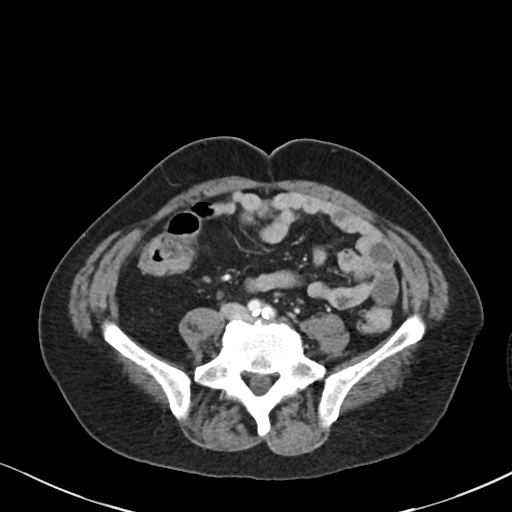
[im 68/126  soft-tissue]
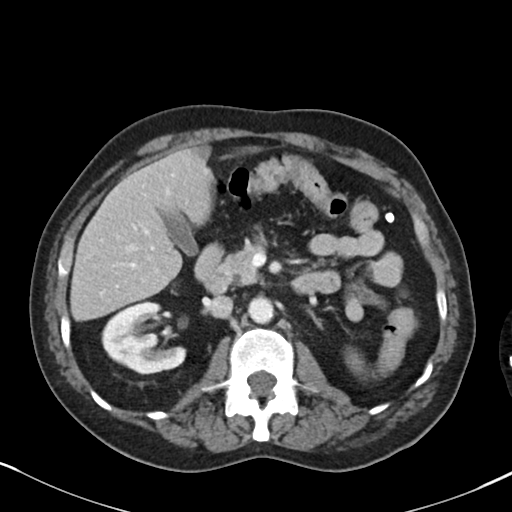
[im 77/126  soft-tissue]
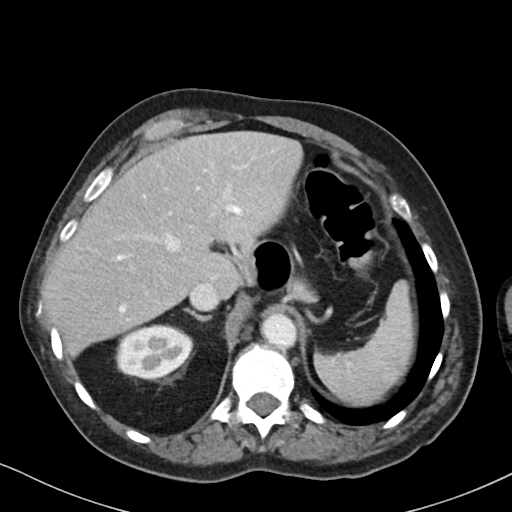
[im 87/126  soft-tissue]
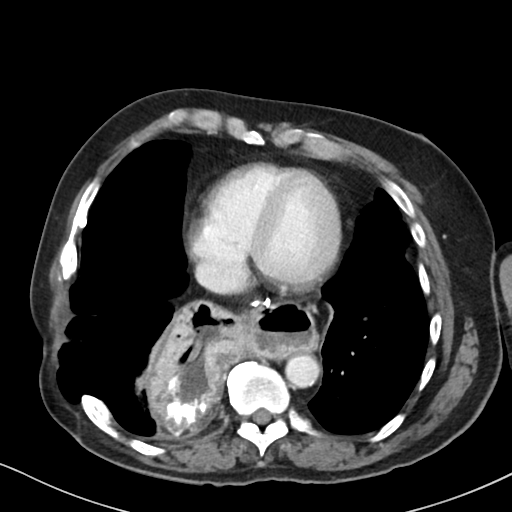
[im 106/126  soft-tissue]
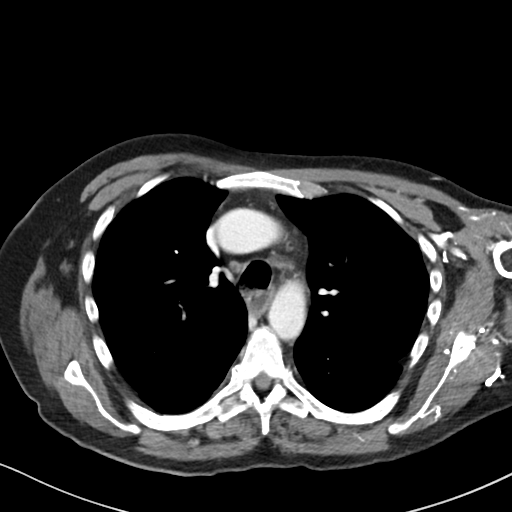
[im 116/126  soft-tissue]
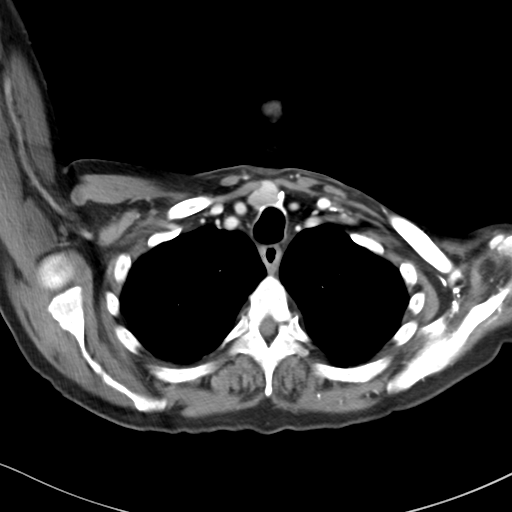
[im 116/126  bone]
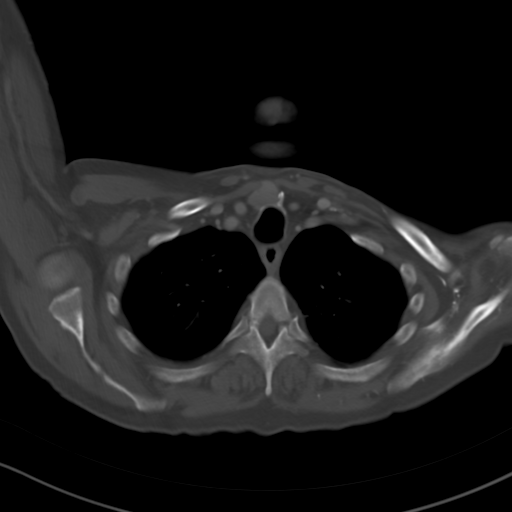

[Series 5: coronal · coronal · 0.65mm/px · 3 of 115 slices shown]
[im 39/115  soft-tissue]
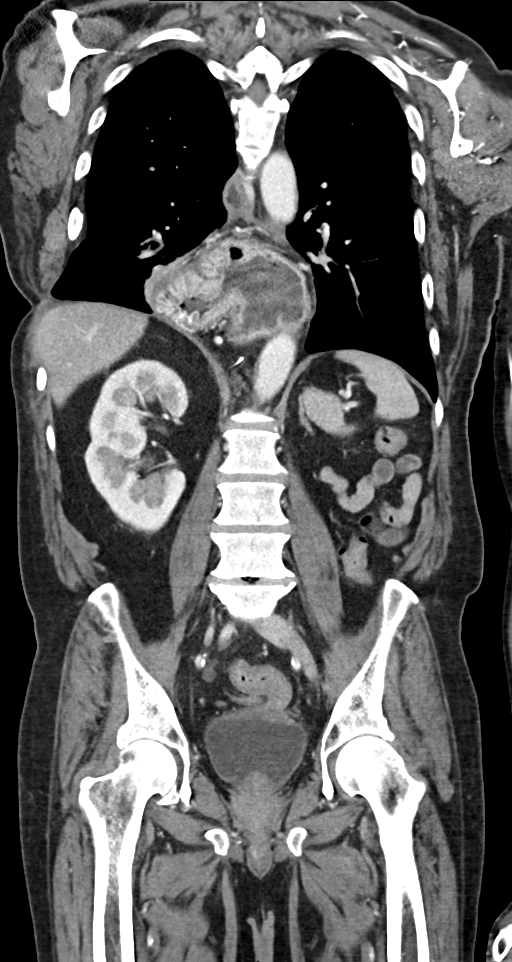
[im 51/115  soft-tissue]
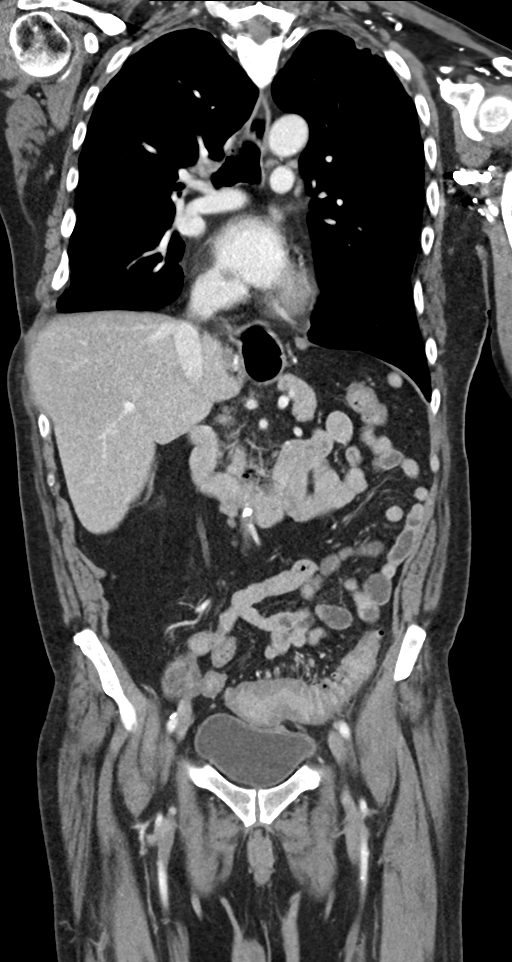
[im 64/115  soft-tissue]
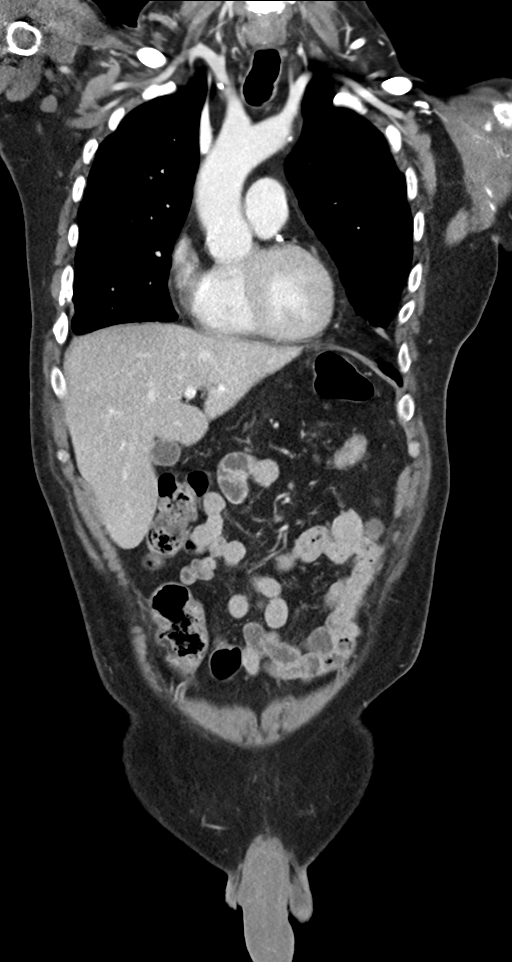

[12 of 46 positions shown; findings below may reference images not displayed]

RADIATION DOSE REDUCTION: This exam was performed according to the
departmental dose-optimization program which includes automated
exposure control, adjustment of the mA and/or kV according to
patient size and/or use of iterative reconstruction technique.

CONTRAST:  75mL OMNIPAQUE IOHEXOL 300 MG/ML  SOLN
FINDINGS: CT CHEST FINDINGS

Cardiovascular: Normal heart size. No significant pericardial
effusion. The thoracic aorta is normal in caliber. Mild
atherosclerotic plaque of the thoracic aorta. 2 vessel coronary
artery calcifications. The main pulmonary artery is normal in
caliber. No central pulmonary embolus.

Mediastinum/Nodes: No enlarged mediastinal, hilar, or axillary lymph
nodes. Thyroid gland and trachea demonstrate no significant
findings. Fluid noted within the esophageal lumen. Vascular clips
noted at the esophageal hiatus. Large hiatal hernia.

Lungs/Pleura: Right lower lobe passive atelectasis. No focal
consolidation. Couple of triangular shaped subpleural pulmonary
micronodule ([DATE], 99) likely representing intrapulmonary lymph
nodes. No pulmonary mass. No pleural effusion. No pneumothorax.

Musculoskeletal:

No chest wall abnormality.

No suspicious lytic or blastic osseous lesions. No acute displaced
fracture. Old healed right seventh - ninth rib fractures are noted.

CT ABDOMEN PELVIS FINDINGS

Hepatobiliary: Subcentimeter hypodensity along the falciform
ligament likely focal fatty infiltration. Couple scattered
subcentimeter hypodensities within the left hepatic lobe too small
to characterize. Otherwise no focal liver abnormality. No
gallstones, gallbladder wall thickening, or pericholecystic fluid.
No biliary dilatation.

Pancreas: No focal lesion. Normal pancreatic contour. No surrounding
inflammatory changes. No main pancreatic ductal dilatation.

Spleen: Normal in size without focal abnormality.

Adrenals/Urinary Tract:

No adrenal nodule bilaterally.

The left kidney is absent.  The right kidney enhances symmetrically.

No hydronephrosis. No hydroureter.

The urinary bladder is unremarkable.

Trace excretion of intravenous contrast on delayed view the right
kidney.

Stomach/Bowel: Stomach is within normal limits. No evidence of small
bowel wall thickening or dilatation. Diffuse sigmoid diverticulosis.
Persistent proximal mid sigmoid colon bowel wall thickening with
query underlying infiltrative mass. Similar appearing tethering of
the proximal/mid sigmoid colon and rectum ([DATE]) with associated
query 0.8 x 1.2 cm pelvic soft tissue nodule. No pericolonic fat
stranding or inflammatory changes within the pelvis. Appendix
appears normal with tip terminating near the sigmoid and rectal
stuttering.

Vascular/Lymphatic: No abdominal aorta or iliac aneurysm. Moderate
to severe atherosclerotic plaque of the aorta and its branches. No
abdominal, pelvic, or inguinal lymphadenopathy.

Reproductive: Prostate is unremarkable.

Other: No intraperitoneal free fluid. No intraperitoneal free gas.
No organized fluid collection.

Musculoskeletal:

No abdominal wall hernia or abnormality.

No suspicious lytic or blastic osseous lesions. No acute displaced
fracture.
IMPRESSION: 1. Diffuse sigmoid diverticulosis with no definite finding of acute
diverticulitis. Persistent proximal mid sigmoid colon bowel wall
thickening with query underlying infiltrative mass with similar
tethering of the proximal/mid sigmoid colon and rectum with
associated 0.8 x 1.2 cm pelvic soft tissue nodule. Findings
concerning for malignancy. Differential diagnosis includes less
likely scarring from prior diverticulitis versus early mild acute
diverticulitis. Recommend colonoscopy to evaluate for an underlying
mass lesion.
2. Large hiatal hernia containing the majority of the stomach.

These results were called by telephone at the time of interpretation
on [DATE] at [DATE] to provider Dr. MINO, who verbally
acknowledged these results.

## 2021-05-21 MED ORDER — IOHEXOL 300 MG/ML  SOLN
75.0000 mL | Freq: Once | INTRAMUSCULAR | Status: AC | PRN
  Administered 2021-05-21: 75 mL via INTRAVENOUS

## 2021-05-21 MED ORDER — POTASSIUM CHLORIDE CRYS ER 20 MEQ PO TBCR
40.0000 meq | EXTENDED_RELEASE_TABLET | Freq: Once | ORAL | Status: AC
  Administered 2021-05-21: 40 meq via ORAL
  Filled 2021-05-21: qty 2

## 2021-05-21 MED ORDER — MORPHINE SULFATE (PF) 4 MG/ML IV SOLN
4.0000 mg | Freq: Once | INTRAVENOUS | Status: AC
  Administered 2021-05-21: 4 mg via INTRAVENOUS
  Filled 2021-05-21: qty 1

## 2021-05-21 NOTE — ED Notes (Signed)
Patient given discharge instructions. Questions were answered. Patient verbalized understanding of discharge instructions and care at home. ? ?IV removed, patient discharged with cab voucher.  ?

## 2021-05-21 NOTE — ED Provider Notes (Signed)
?[facility] ?Provider Note ? ? ?CSN: [id number] ?Arrival date & time: 05/21/21  1545 ? ?  ? ?History ? ?Chief Complaint  ?Patient presents with  ? Abdominal Pain  ? Back Pain  ? ? ?Colin Hamilton is a 61 y.o. 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 difficile who presents to the ED today for evaluation of lower back and abdominal pain.  Patient's friend states that he noticed patient's lower back was significantly bruised about 2-1/2 weeks ago without known trauma.  Patient is also had significantly worsening abdominal pain with recurrent vomiting and diarrhea.  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  His friend was trying to get him to come in much earlier but patient was reluctant. ? ? ?Per chart review:  ?04/16/20: Patient admitted to [facility] for esophagitis and suspected sepsis with leukocytosis and elevated lactic acid that was ultimately determined to be secondary to dehydration. ? ?07/21/2020 for suspected sepsis with negative work-up including blood cultures, chest x-ray and CT abdomen and MRI of the lumbar and thoracic spine at that time with no acute changes aside from degenerative disc disease.  Initial concern for sepsis due to colitis and pneumonia ruled out with normal imaging and blood work. ? ?Abdominal Pain ?Back Pain ?Associated symptoms: abdominal pain   ? ?  ? ?Home Medications ?Prior to Admission medications   ?Medication Sig Start Date End Date Taking? Authorizing Provider  ?albuterol (VENTOLIN HFA) 108 (90 Base) MCG/ACT inhaler Inhale 2 puffs into the lungs every 6 (six) hours as needed for wheezing or  shortness of breath.    [provider]  ?cyclobenzaprine (FLEXERIL) 10 MG tablet Take 1 tablet (10 mg total) by mouth 3 (three) times daily as needed for muscle spasms. 07/28/20   Oswald Hillock, MD  ?docusate sodium (COLACE) 100 MG capsule Take 100 mg by mouth daily.    [provider]  ?ferrous gluconate (FERGON) 324 MG tablet Take 1 tablet (324 mg total) by mouth daily with breakfast. 07/29/20   Oswald Hillock, MD  ?Fluticasone-Umeclidin-Vilant (TRELEGY ELLIPTA) 100-62.5-25 MCG/INH AEPB Inhale 1 puff into the lungs daily.    [provider]  ?HYDROcodone-acetaminophen (NORCO/VICODIN) 5-325 MG tablet Take 1 tablet by mouth every 6 (six) hours as needed for moderate pain. 07/28/20   Oswald Hillock, MD  ?metoCLOPramide (REGLAN) 5 MG tablet Take 1 tablet (5 mg total) by mouth every 8 (eight) hours as needed for nausea. ?Patient taking differently: Take 5 mg by mouth 2 (two) times daily before a meal. 06/09/20 07/09/20  Ghimire, Henreitta Leber, MD  ?metoprolol tartrate (LOPRESSOR) 25 MG tablet Take 0.5 tablets (12.5 mg total) by mouth 2 (two) times daily. 07/28/20   Oswald Hillock, MD  ?ondansetron (ZOFRAN) 8 MG tablet Take 8 mg by mouth every 8 (eight) hours as needed for nausea or vomiting.    [provider]  ?pantoprazole (PROTONIX) 40 MG tablet Take 40 mg by mouth daily.    [provider]  ?QUEtiapine (SEROQUEL) 50 MG tablet Take 50 mg by mouth at bedtime. ?Patient not taking: No sig reported    [provider]  ?sucralfate (CARAFATE) 1 g tablet Take 1 g by mouth 2 (two)  times daily before a meal.    [provider]  ?   ? ?Allergies    ?Penicillins   ? ?Review of Systems   ?Review of Systems  ?Gastrointestinal:  Positive for abdominal pain.  ?Musculoskeletal:  Positive for back pain.  ? ?Physical Exam ?Updated Vital Signs ?BP 136/88 (BP Location: Left Arm)   Pulse 97   Temp 98.3 ?F (36.8 ?C)   Resp 20   Ht 5\' 8"  (1.727 m)   Wt 63.5 kg   SpO2 98%   BMI 21.29  kg/m?  ?Physical Exam ?Vitals and nursing note reviewed.  ?Constitutional:   ?   General: He is not in acute distress. ?   Appearance: He is not ill-appearing.  ?HENT:  ?   Head: Atraumatic.  ?Eyes:  ?   Conjunctiva/sclera: Conjunctivae normal.  ?Cardiovascular:  ?   Rate and Rhythm: Normal rate and regular rhythm.  ?   Pulses: Normal pulses.  ?   Heart sounds: No murmur heard. ?Pulmonary:  ?   Effort: Pulmonary effort is normal. No respiratory distress.  ?   Breath sounds: Normal breath sounds.  ?Abdominal:  ?   General: Abdomen is flat. There is no distension.  ?   Palpations: Abdomen is soft.  ?   Tenderness: There is abdominal tenderness.  ?   Comments: Abdomen is soft, nondistended, exquisitely tender in all quadrants however worse on the left side.  Numerous postsurgical scars including midline surgical scar secondary to colostomy due to diverticulitis, right-sided rib scars s/p rib removal.  ?Musculoskeletal:     ?   General: Normal range of motion.  ?   Cervical back: Normal range of motion.  ?   Comments: Diffuse lacy bruising, likely Cullen's sign, across the entire lumbar region with exquisite tenderness to palpation.  Lumbar nontender to palpation midline  ?Skin: ?   General: Skin is warm and dry.  ?   Capillary Refill: Capillary refill takes less than 2 seconds.  ?Neurological:  ?   General: No focal deficit present.  ?   Mental Status: He is alert.  ?Psychiatric:     ?   Mood and Affect: Mood normal.  ? ? ?ED Results / Procedures / Treatments   ?Labs ?(all labs ordered are listed, but only abnormal results are displayed) ?Labs Reviewed  ?LIPASE, BLOOD - Abnormal; Notable for the following components:  ?    Result Value  ? Lipase 54 (*)   ? All other components within normal limits  ?COMPREHENSIVE METABOLIC PANEL - Abnormal; Notable for the following components:  ? Potassium 2.9 (*)   ? CO2 18 (*)   ? Glucose, Bld 101 (*)   ? Creatinine, Ser 1.31 (*)   ? AST 13 (*)   ? Anion gap 18 (*)   ? All other  components within normal limits  ?CBC - Abnormal; Notable for the following components:  ? Hemoglobin 12.1 (*)   ? HCT 37.3 (*)   ? RDW 15.8 (*)   ? All other components within normal limits  ?URINALYSIS, ROUTINE W REFLEX MICROSCOPIC - Abnormal; Notable for the following components:  ? Color, Urine COLORLESS (*)   ? Specific Gravity, Urine <1.005 (*)   ? Leukocytes,Ua MODERATE (*)   ? All other components within normal limits  ?PROTIME-INR  ?APTT  ? ? ?EKG ?None ? ?Radiology ?CT CHEST ABDOMEN PELVIS W CONTRAST ? ?Result Date: 05/21/2021 ?CLINICAL DATA:  back/abdominal pain with retroperitoneal bruising. Recent esophageal perforation and  hospitalization. EXAM: CT CHEST, ABDOMEN, AND PELVIS WITH CONTRAST TECHNIQUE: Multidetector CT imaging of the chest, abdomen and pelvis was performed following the standard protocol during bolus administration of intravenous contrast. RADIATION DOSE REDUCTION: This exam was performed according to the departmental dose-optimization program which includes automated exposure control, adjustment of the mA and/or kV according to patient size and/or use of iterative reconstruction technique. CONTRAST:  1mL OMNIPAQUE IOHEXOL 300 MG/ML  SOLN COMPARISON:  CT abdomen pelvis 07/21/2020, CT abdomen pelvis 07/26/2020, CT abdomen pelvis 06/05/2020 FINDINGS: CT CHEST FINDINGS Cardiovascular: Normal heart size. No significant pericardial effusion. The thoracic aorta is normal in caliber. Mild atherosclerotic plaque of the thoracic aorta. 2 vessel coronary artery calcifications. The main pulmonary artery is normal in caliber. No central pulmonary embolus. Mediastinum/Nodes: No enlarged mediastinal, hilar, or axillary lymph nodes. Thyroid gland and trachea demonstrate no significant findings. Fluid noted within the esophageal lumen. Vascular clips noted at the esophageal hiatus. Large hiatal hernia. Lungs/Pleura: Right lower lobe passive atelectasis. No focal consolidation. Couple of triangular shaped  subpleural pulmonary micronodule (4:87, 99) likely representing intrapulmonary lymph nodes. No pulmonary mass. No pleural effusion. No pneumothorax. Musculoskeletal: No chest wall abnormality. No suspicious lytic

## 2021-05-21 NOTE — ED Notes (Signed)
Pt describes n/v/d with abdominal pain & back pain x 2 weeks.  Pt does have hx of ETOH abuse.  Bruising noted to back, although pt states he lays on a heating pad 24/7.  Bruising pattern looks like where he lays on heating pad.  Pt admits to drinking(liquor) last night.   ?

## 2021-05-21 NOTE — ED Triage Notes (Addendum)
Patient arrives via POV with complaints of back pain and abdominal x2 weeks. Patient also states he's had nausea, vomiting, and diarrhea. Patient also has notable bruising to back that started about 1 week ago. Per patient, he has an extensive history of abdominal surgeries. Family friend at bedside.  ? ?Patient rates pain a 10/10.  ?

## 2021-05-21 NOTE — Discharge Instructions (Addendum)
Your work-up today was overall benign.  Your labs are normal aside from mild hypokalemia which we supplemented while you are here in the emergency department.  Your CT does have areas that are concerning for possible mass or cancer.  We strongly recommend that you follow-up with the GI referral provided in order to have a colonoscopy.  Continue to drink plenty of water to help with your diarrhea.  Please return to the emergency department if you develop worsening symptoms, fever. ?

## 2021-05-30 ENCOUNTER — Encounter (HOSPITAL_BASED_OUTPATIENT_CLINIC_OR_DEPARTMENT_OTHER): Payer: Self-pay | Admitting: Emergency Medicine

## 2021-05-30 ENCOUNTER — Emergency Department (HOSPITAL_BASED_OUTPATIENT_CLINIC_OR_DEPARTMENT_OTHER): Payer: Medicare (Managed Care)

## 2021-05-30 ENCOUNTER — Other Ambulatory Visit: Payer: Self-pay

## 2021-05-30 ENCOUNTER — Inpatient Hospital Stay (HOSPITAL_BASED_OUTPATIENT_CLINIC_OR_DEPARTMENT_OTHER)
Admission: EM | Admit: 2021-05-30 | Discharge: 2021-07-01 | DRG: 870 | Disposition: E | Payer: Medicare (Managed Care) | Attending: Pulmonary Disease | Admitting: Pulmonary Disease

## 2021-05-30 DIAGNOSIS — I10 Essential (primary) hypertension: Secondary | ICD-10-CM | POA: Diagnosis present

## 2021-05-30 DIAGNOSIS — E872 Acidosis, unspecified: Secondary | ICD-10-CM | POA: Diagnosis present

## 2021-05-30 DIAGNOSIS — R0603 Acute respiratory distress: Secondary | ICD-10-CM

## 2021-05-30 DIAGNOSIS — F1010 Alcohol abuse, uncomplicated: Secondary | ICD-10-CM | POA: Diagnosis present

## 2021-05-30 DIAGNOSIS — F419 Anxiety disorder, unspecified: Secondary | ICD-10-CM | POA: Diagnosis present

## 2021-05-30 DIAGNOSIS — R569 Unspecified convulsions: Secondary | ICD-10-CM | POA: Diagnosis not present

## 2021-05-30 DIAGNOSIS — R109 Unspecified abdominal pain: Secondary | ICD-10-CM | POA: Diagnosis present

## 2021-05-30 DIAGNOSIS — N179 Acute kidney failure, unspecified: Secondary | ICD-10-CM | POA: Diagnosis not present

## 2021-05-30 DIAGNOSIS — D696 Thrombocytopenia, unspecified: Secondary | ICD-10-CM | POA: Diagnosis not present

## 2021-05-30 DIAGNOSIS — A419 Sepsis, unspecified organism: Principal | ICD-10-CM | POA: Diagnosis present

## 2021-05-30 DIAGNOSIS — K2970 Gastritis, unspecified, without bleeding: Secondary | ICD-10-CM | POA: Diagnosis present

## 2021-05-30 DIAGNOSIS — J80 Acute respiratory distress syndrome: Secondary | ICD-10-CM | POA: Diagnosis present

## 2021-05-30 DIAGNOSIS — J189 Pneumonia, unspecified organism: Secondary | ICD-10-CM

## 2021-05-30 DIAGNOSIS — Q600 Renal agenesis, unilateral: Secondary | ICD-10-CM

## 2021-05-30 DIAGNOSIS — R54 Age-related physical debility: Secondary | ICD-10-CM | POA: Diagnosis present

## 2021-05-30 DIAGNOSIS — G8194 Hemiplegia, unspecified affecting left nondominant side: Secondary | ICD-10-CM | POA: Diagnosis not present

## 2021-05-30 DIAGNOSIS — I130 Hypertensive heart and chronic kidney disease with heart failure and stage 1 through stage 4 chronic kidney disease, or unspecified chronic kidney disease: Secondary | ICD-10-CM | POA: Diagnosis present

## 2021-05-30 DIAGNOSIS — D649 Anemia, unspecified: Secondary | ICD-10-CM | POA: Diagnosis present

## 2021-05-30 DIAGNOSIS — G9341 Metabolic encephalopathy: Secondary | ICD-10-CM | POA: Diagnosis present

## 2021-05-30 DIAGNOSIS — J690 Pneumonitis due to inhalation of food and vomit: Secondary | ICD-10-CM | POA: Diagnosis present

## 2021-05-30 DIAGNOSIS — D631 Anemia in chronic kidney disease: Secondary | ICD-10-CM | POA: Diagnosis present

## 2021-05-30 DIAGNOSIS — K449 Diaphragmatic hernia without obstruction or gangrene: Secondary | ICD-10-CM | POA: Diagnosis present

## 2021-05-30 DIAGNOSIS — J45901 Unspecified asthma with (acute) exacerbation: Secondary | ICD-10-CM

## 2021-05-30 DIAGNOSIS — F1011 Alcohol abuse, in remission: Secondary | ICD-10-CM | POA: Diagnosis present

## 2021-05-30 DIAGNOSIS — I6340 Cerebral infarction due to embolism of unspecified cerebral artery: Secondary | ICD-10-CM | POA: Diagnosis not present

## 2021-05-30 DIAGNOSIS — K5730 Diverticulosis of large intestine without perforation or abscess without bleeding: Secondary | ICD-10-CM | POA: Diagnosis present

## 2021-05-30 DIAGNOSIS — L8990 Pressure ulcer of unspecified site, unspecified stage: Secondary | ICD-10-CM | POA: Insufficient documentation

## 2021-05-30 DIAGNOSIS — E43 Unspecified severe protein-calorie malnutrition: Secondary | ICD-10-CM | POA: Diagnosis present

## 2021-05-30 DIAGNOSIS — K2270 Barrett's esophagus without dysplasia: Secondary | ICD-10-CM | POA: Diagnosis present

## 2021-05-30 DIAGNOSIS — Z20822 Contact with and (suspected) exposure to covid-19: Secondary | ICD-10-CM | POA: Diagnosis present

## 2021-05-30 DIAGNOSIS — I5032 Chronic diastolic (congestive) heart failure: Secondary | ICD-10-CM | POA: Diagnosis present

## 2021-05-30 DIAGNOSIS — N182 Chronic kidney disease, stage 2 (mild): Secondary | ICD-10-CM | POA: Diagnosis present

## 2021-05-30 DIAGNOSIS — Z79899 Other long term (current) drug therapy: Secondary | ICD-10-CM

## 2021-05-30 DIAGNOSIS — Z87891 Personal history of nicotine dependence: Secondary | ICD-10-CM

## 2021-05-30 DIAGNOSIS — E875 Hyperkalemia: Secondary | ICD-10-CM | POA: Diagnosis not present

## 2021-05-30 DIAGNOSIS — J9601 Acute respiratory failure with hypoxia: Secondary | ICD-10-CM | POA: Diagnosis not present

## 2021-05-30 DIAGNOSIS — R000 Tachycardia, unspecified: Secondary | ICD-10-CM | POA: Diagnosis present

## 2021-05-30 DIAGNOSIS — J4520 Mild intermittent asthma, uncomplicated: Secondary | ICD-10-CM | POA: Diagnosis present

## 2021-05-30 DIAGNOSIS — K760 Fatty (change of) liver, not elsewhere classified: Secondary | ICD-10-CM | POA: Diagnosis present

## 2021-05-30 DIAGNOSIS — R6520 Severe sepsis without septic shock: Secondary | ICD-10-CM | POA: Insufficient documentation

## 2021-05-30 DIAGNOSIS — K2100 Gastro-esophageal reflux disease with esophagitis, without bleeding: Secondary | ICD-10-CM | POA: Diagnosis present

## 2021-05-30 DIAGNOSIS — E876 Hypokalemia: Secondary | ICD-10-CM | POA: Diagnosis present

## 2021-05-30 DIAGNOSIS — E878 Other disorders of electrolyte and fluid balance, not elsewhere classified: Secondary | ICD-10-CM

## 2021-05-30 DIAGNOSIS — N1831 Chronic kidney disease, stage 3a: Secondary | ICD-10-CM

## 2021-05-30 DIAGNOSIS — Z8673 Personal history of transient ischemic attack (TIA), and cerebral infarction without residual deficits: Secondary | ICD-10-CM

## 2021-05-30 DIAGNOSIS — Z880 Allergy status to penicillin: Secondary | ICD-10-CM

## 2021-05-30 DIAGNOSIS — K317 Polyp of stomach and duodenum: Secondary | ICD-10-CM | POA: Diagnosis present

## 2021-05-30 DIAGNOSIS — K219 Gastro-esophageal reflux disease without esophagitis: Secondary | ICD-10-CM | POA: Diagnosis present

## 2021-05-30 DIAGNOSIS — E873 Alkalosis: Secondary | ICD-10-CM | POA: Diagnosis not present

## 2021-05-30 DIAGNOSIS — E785 Hyperlipidemia, unspecified: Secondary | ICD-10-CM | POA: Diagnosis present

## 2021-05-30 DIAGNOSIS — E871 Hypo-osmolality and hyponatremia: Secondary | ICD-10-CM | POA: Diagnosis not present

## 2021-05-30 DIAGNOSIS — Z7951 Long term (current) use of inhaled steroids: Secondary | ICD-10-CM

## 2021-05-30 DIAGNOSIS — Z515 Encounter for palliative care: Secondary | ICD-10-CM

## 2021-05-30 DIAGNOSIS — Z66 Do not resuscitate: Secondary | ICD-10-CM | POA: Diagnosis not present

## 2021-05-30 DIAGNOSIS — R935 Abnormal findings on diagnostic imaging of other abdominal regions, including retroperitoneum: Secondary | ICD-10-CM

## 2021-05-30 DIAGNOSIS — R6521 Severe sepsis with septic shock: Secondary | ICD-10-CM | POA: Diagnosis present

## 2021-05-30 LAB — COMPREHENSIVE METABOLIC PANEL
ALT: 7 U/L (ref 0–44)
AST: 16 U/L (ref 15–41)
Albumin: 4.6 g/dL (ref 3.5–5.0)
Alkaline Phosphatase: 90 U/L (ref 38–126)
Anion gap: 15 (ref 5–15)
BUN: 8 mg/dL (ref 6–20)
CO2: 24 mmol/L (ref 22–32)
Calcium: 9.2 mg/dL (ref 8.9–10.3)
Chloride: 100 mmol/L (ref 98–111)
Creatinine, Ser: 1.29 mg/dL — ABNORMAL HIGH (ref 0.61–1.24)
GFR, Estimated: >60 mL/min (ref >60)
Glucose, Bld: 119 mg/dL — ABNORMAL HIGH (ref 70–99)
Potassium: 2.9 mmol/L — ABNORMAL LOW (ref 3.5–5.1)
Sodium: 139 mmol/L (ref 135–145)
Total Bilirubin: 0.6 mg/dL (ref 0.3–1.2)
Total Protein: 8.6 g/dL — ABNORMAL HIGH (ref 6.5–8.1)

## 2021-05-30 LAB — URINALYSIS, ROUTINE W REFLEX MICROSCOPIC
Bilirubin Urine: NEGATIVE
Glucose, UA: NEGATIVE mg/dL
Hgb urine dipstick: NEGATIVE
Ketones, ur: NEGATIVE mg/dL
Leukocytes,Ua: NEGATIVE
Nitrite: NEGATIVE
Specific Gravity, Urine: 1.007 (ref 1.005–1.030)
pH: 7 (ref 5.0–8.0)

## 2021-05-30 LAB — I-STAT ARTERIAL BLOOD GAS, ED
Acid-Base Excess: 2 mmol/L (ref 0.0–2.0)
Acid-base deficit: 2 mmol/L (ref 0.0–2.0)
Bicarbonate: 22.3 mmol/L (ref 20.0–28.0)
Bicarbonate: 25.4 mmol/L (ref 20.0–28.0)
Calcium, Ion: 1.14 mmol/L — ABNORMAL LOW (ref 1.15–1.40)
Calcium, Ion: 1.15 mmol/L (ref 1.15–1.40)
HCT: 38 % — ABNORMAL LOW (ref 39.0–52.0)
HCT: 35 % — ABNORMAL LOW (ref 39.0–52.0)
Hemoglobin: 12.9 g/dL — ABNORMAL LOW (ref 13.0–17.0)
Hemoglobin: 11.9 g/dL — ABNORMAL LOW (ref 13.0–17.0)
O2 Saturation: 100 %
O2 Saturation: 95 %
Patient temperature: 100
Potassium: 3 mmol/L — ABNORMAL LOW (ref 3.5–5.1)
Potassium: 2.5 mmol/L — CL (ref 3.5–5.1)
Sodium: 139 mmol/L (ref 135–145)
Sodium: 138 mmol/L (ref 135–145)
TCO2: 23 mmol/L (ref 22–32)
TCO2: 27 mmol/L (ref 22–32)
pCO2 arterial: 35.7 mmHg (ref 32–48)
pCO2 arterial: 37 mmHg (ref 32–48)
pH, Arterial: 7.404 (ref 7.35–7.45)
pH, Arterial: 7.448 (ref 7.35–7.45)
pO2, Arterial: 179 mmHg — ABNORMAL HIGH (ref 83–108)
pO2, Arterial: 74 mmHg — ABNORMAL LOW (ref 83–108)

## 2021-05-30 LAB — RESP PANEL BY RT-PCR (FLU A&B, COVID) ARPGX2
Influenza A by PCR: NEGATIVE
Influenza B by PCR: NEGATIVE
SARS Coronavirus 2 by RT PCR: NEGATIVE

## 2021-05-30 LAB — CBC WITH DIFFERENTIAL/PLATELET
Abs Immature Granulocytes: 0.06 10*3/uL (ref 0.00–0.07)
Basophils Absolute: 0 10*3/uL (ref 0.0–0.1)
Basophils Relative: 0 %
Eosinophils Absolute: 0.1 10*3/uL (ref 0.0–0.5)
Eosinophils Relative: 1 %
HCT: 43.8 % (ref 39.0–52.0)
Hemoglobin: 13.5 g/dL (ref 13.0–17.0)
Immature Granulocytes: 1 %
Lymphocytes Relative: 7 %
Lymphs Abs: 0.9 10*3/uL (ref 0.7–4.0)
MCH: 27.7 pg (ref 26.0–34.0)
MCHC: 30.8 g/dL (ref 30.0–36.0)
MCV: 89.9 fL (ref 80.0–100.0)
Monocytes Absolute: 0.6 10*3/uL (ref 0.1–1.0)
Monocytes Relative: 4 %
Neutro Abs: 11.6 10*3/uL — ABNORMAL HIGH (ref 1.7–7.7)
Neutrophils Relative %: 87 %
Platelets: 201 10*3/uL (ref 150–400)
RBC: 4.87 MIL/uL (ref 4.22–5.81)
RDW: 17.2 % — ABNORMAL HIGH (ref 11.5–15.5)
WBC: 13.2 10*3/uL — ABNORMAL HIGH (ref 4.0–10.5)
nRBC: 0 % (ref 0.0–0.2)

## 2021-05-30 LAB — APTT: aPTT: 28 seconds (ref 24–36)

## 2021-05-30 LAB — LACTIC ACID, PLASMA
Lactic Acid, Venous: 4.9 mmol/L (ref 0.5–1.9)
Lactic Acid, Venous: 2.5 mmol/L (ref 0.5–1.9)

## 2021-05-30 LAB — PROTIME-INR
INR: 1 (ref 0.8–1.2)
Prothrombin Time: 12.7 seconds (ref 11.4–15.2)

## 2021-05-30 IMAGING — DX DG CHEST 1V PORT
1 series · 1 of 1 positions shown · non-contrast
Comparison: Chest x-ray [DATE], CT chest [DATE]

CLINICAL DATA: sob

EXAM:
PORTABLE CHEST 1 VIEW

[chest ap]
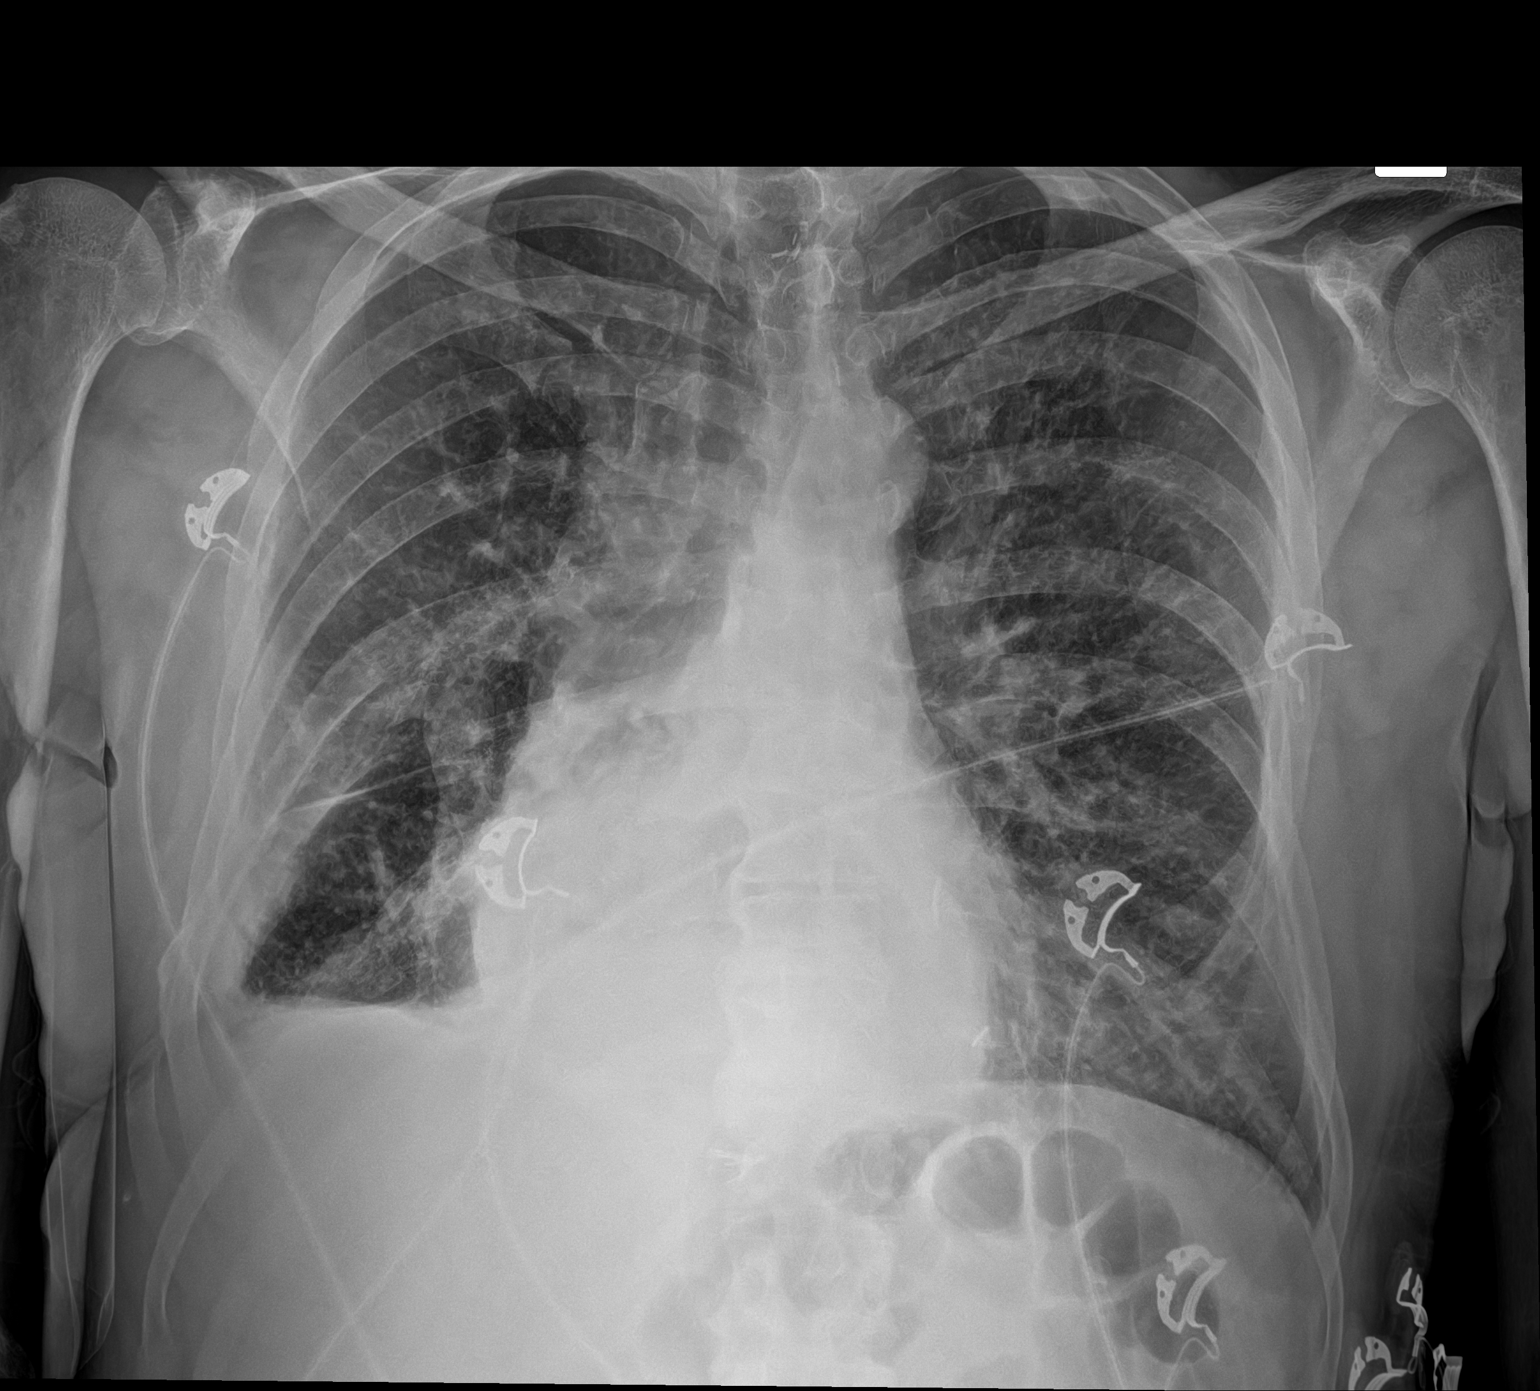

[1 of 1 positions shown; findings below may reference images not displayed]

FINDINGS: The heart and mediastinal contours are unchanged. Prominent hilar
vasculature. Large hiatal hernia overlies the cardiac silhouette.

Apparent right lower lung zone patchy airspace opacity likely due to
patient rotation. Increased interstitial markings. Right
costophrenic angle with suggestion of a trace pleural effusion. No
pneumothorax.

No acute osseous abnormality.
IMPRESSION: 1. Apparent right lower lung zone patchy airspace opacity likely due
to patient rotation.
2. Query mild pulmonary edema.
3. Suggestion of a trace right pleural effusion.
4. Blunting of the right costophrenic angle with suggestion of a
trace pleural effusion.
5. Large hiatal hernia.

## 2021-05-30 IMAGING — CT CT CHEST-ABD-PELV W/ CM
2 of 5 series · 13 of 46 positions shown, 15 images · IV contrast (APPLIED)
Comparison: Chest radiograph dated [DATE] and CT dated
[DATE].

CLINICAL DATA: Sepsis. Status post colonoscopy. Chest pain and
shortness of breath.

EXAM:
CT CHEST, ABDOMEN, AND PELVIS WITH CONTRAST
TECHNIQUE: Multidetector CT imaging of the chest, abdomen and pelvis was
performed following the standard protocol during bolus
administration of intravenous contrast.

[Series 2: cap with · axial · 0.70mm/px · z∈[-1066,-591]mm · 10 of 115 slices shown, 12 images]
[im 10/115  soft-tissue]
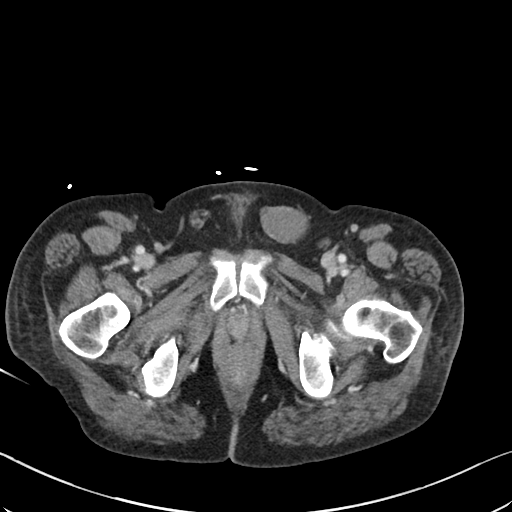
[im 10/115  bone]
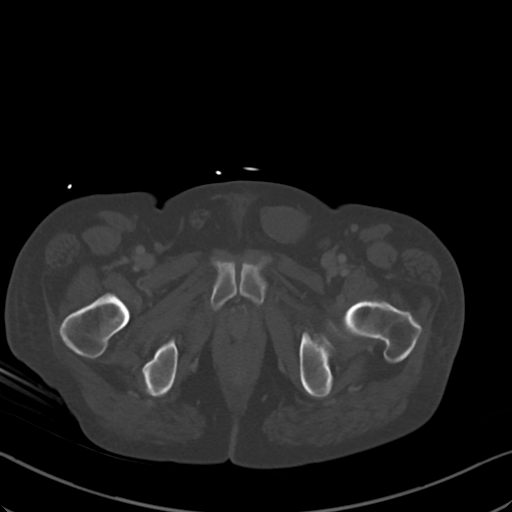
[im 20/115  soft-tissue]
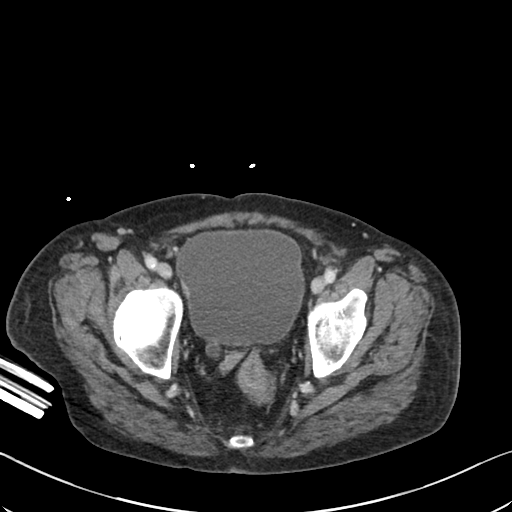
[im 29/115  soft-tissue]
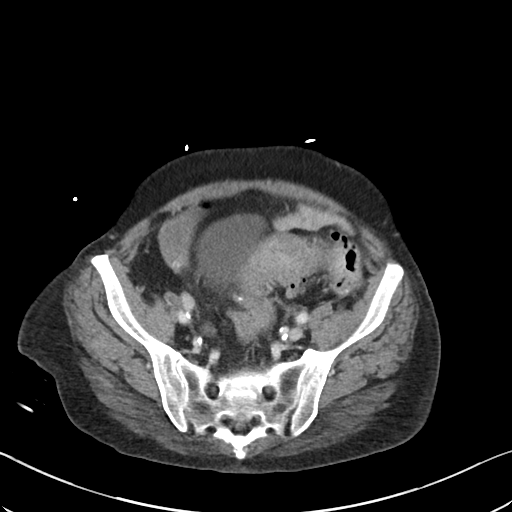
[im 39/115  soft-tissue]
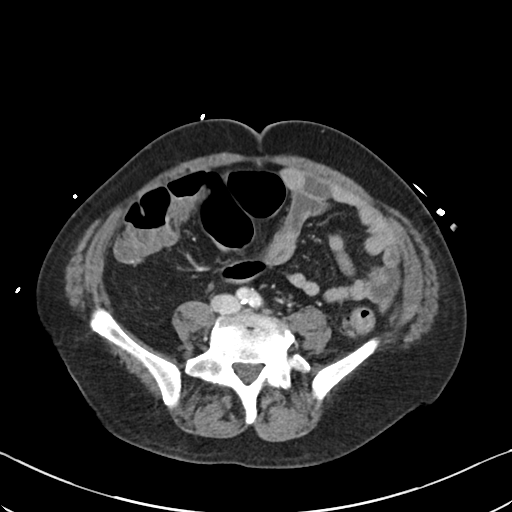
[im 48/115  soft-tissue]
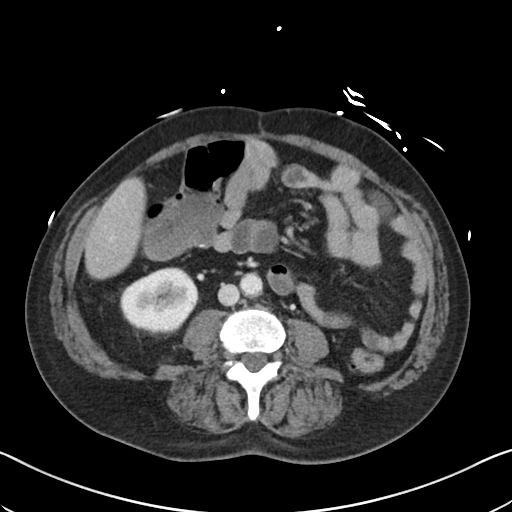
[im 67/115  soft-tissue]
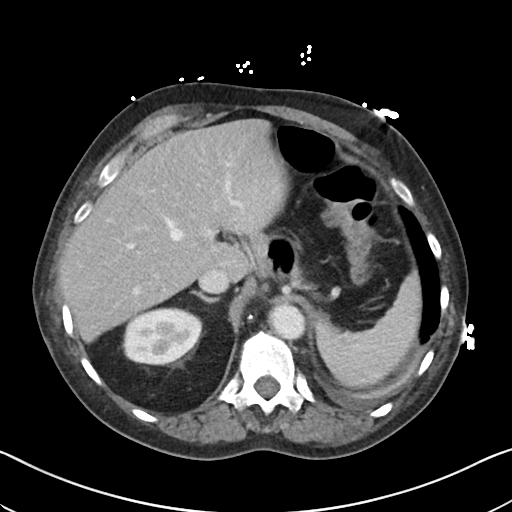
[im 77/115  soft-tissue]
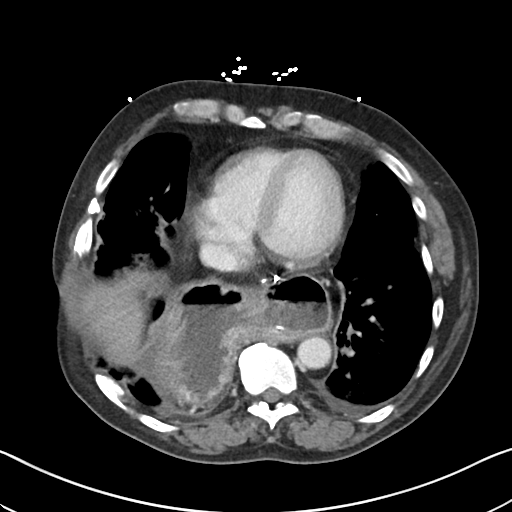
[im 86/115  soft-tissue]
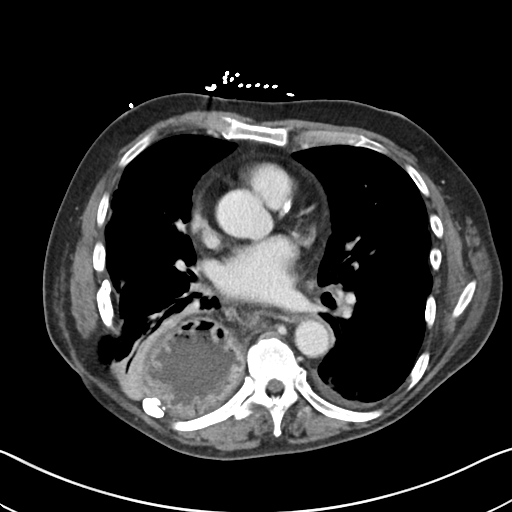
[im 96/115  soft-tissue]
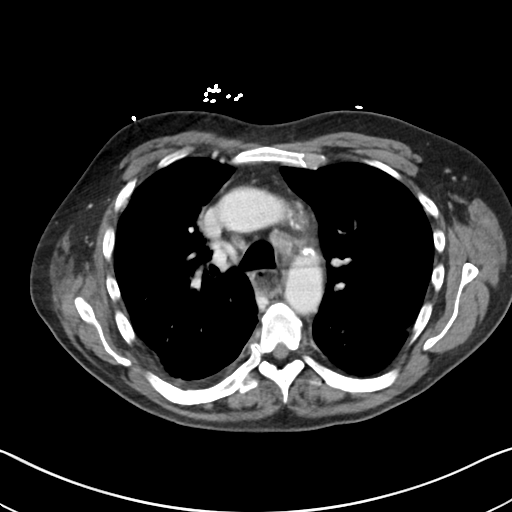
[im 96/115  bone]
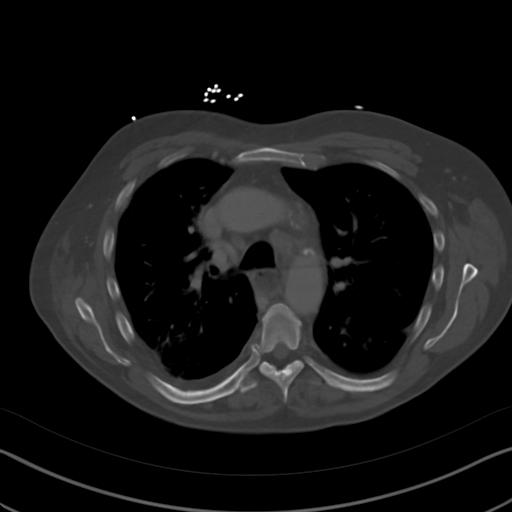
[im 105/115  soft-tissue]
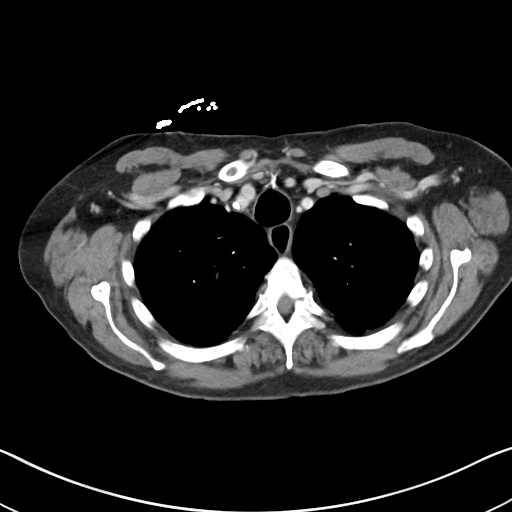

[Series 5: coronal · coronal · 0.71mm/px · 3 of 99 slices shown]
[im 33/99  soft-tissue]
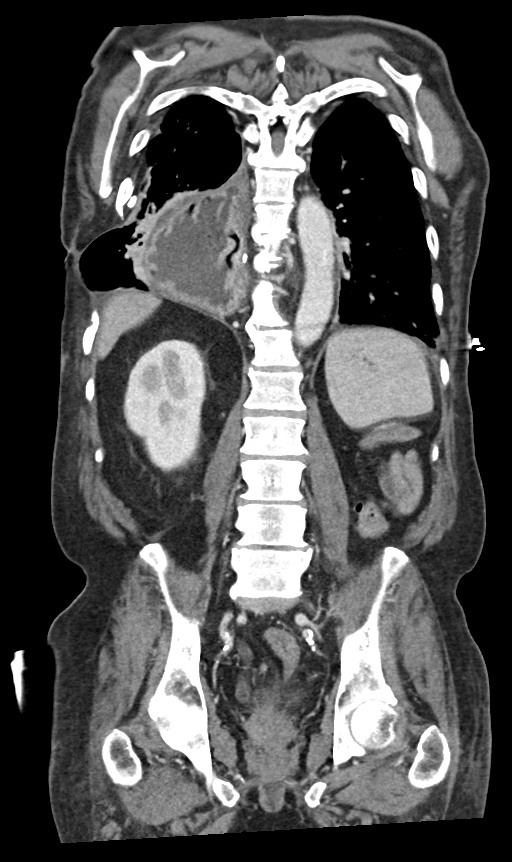
[im 44/99  soft-tissue]
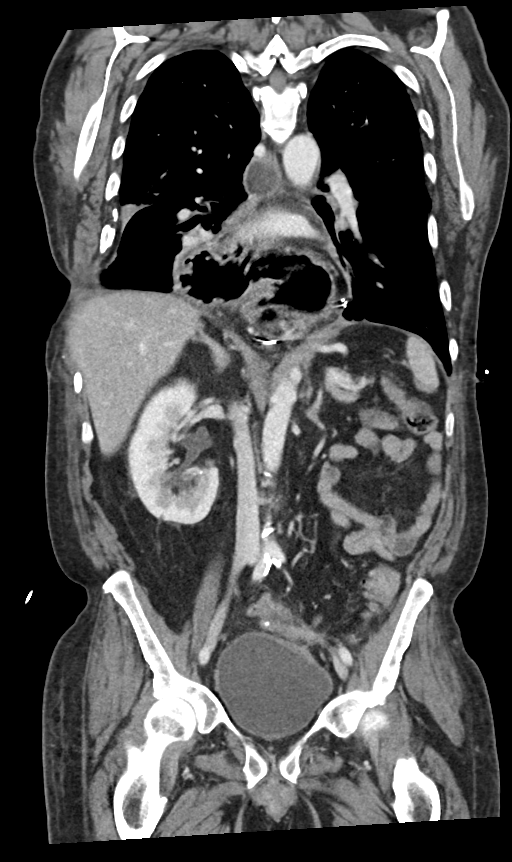
[im 55/99  soft-tissue]
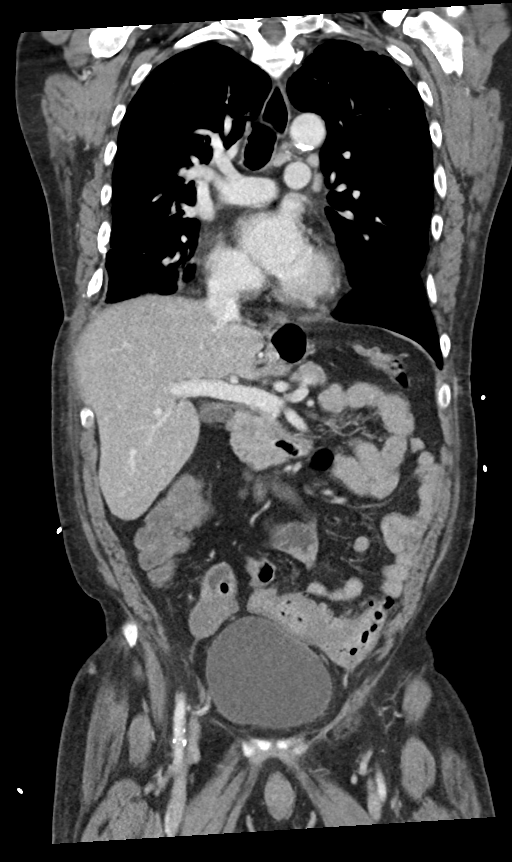

[13 of 46 positions shown; findings below may reference images not displayed]

RADIATION DOSE REDUCTION: This exam was performed according to the
departmental dose-optimization program which includes automated
exposure control, adjustment of the mA and/or kV according to
patient size and/or use of iterative reconstruction technique.

CONTRAST:  80mL OMNIPAQUE IOHEXOL 300 MG/ML  SOLN
FINDINGS: CT CHEST FINDINGS

Cardiovascular: There is no cardiomegaly or pericardial effusion.
There is coronary vascular calcification. Mild atherosclerotic
calcification of the thoracic aorta. No aneurysmal dilatation or
dissection. The origins of the great vessels of the aortic arch
appear patent as visualized. The central pulmonary arteries are
patent.

Mediastinum/Nodes: No hilar or mediastinal adenopathy. There is a
large hiatal hernia containing the majority of the stomach. Mild
dilatation of the lower esophagus with ingested content which may
represent reflux or delayed clearance. This can predispose to
aspiration. No mediastinal fluid collection. Status post prior left
hemithyroidectomy.

Lungs/Pleura: Diffuse scattered nodular densities primarily
throughout the left lung most consistent with pneumonia, likely
atypical in etiology versus aspiration. Scattered clusters of
ground-glass subpleural density noted in the right upper lobe. There
is partial compressive atelectasis of the right lower lobe due to
mass effect caused by the large hiatal hernia. Partial left lung
base consolidation may represent atelectasis or infiltrate. There is
a small left pleural effusion. No pneumothorax. The central airways
are patent.

Musculoskeletal: Status post prior right posterior thoracotomy and
rib resection. No acute osseous pathology.

CT ABDOMEN PELVIS FINDINGS

No intra-abdominal free air or free fluid.

Hepatobiliary: No focal liver abnormality is seen. No gallstones,
gallbladder wall thickening, or biliary dilatation.

Pancreas: Unremarkable. No pancreatic ductal dilatation or
surrounding inflammatory changes.

Spleen: Normal in size without focal abnormality.

Adrenals/Urinary Tract: The adrenal glands unremarkable. There is a
solitary right kidney. There is no hydronephrosis. The visualized
ureters appear unremarkable there is focal area of linear
enhancement along the right lateral bladder wall measuring
approximately 3 cm in length and 3 mm in thickness. This was not
seen on the prior CT. Although this may represent adherent debris,
an enhancing mass is not excluded. Further evaluation with
cystoscopy or dedicated multiphasic CT urography on a
nonemergent/outpatient basis recommended.

Stomach/Bowel: There is sigmoid diverticulosis with muscular
hypertrophy. There is tethering of the sigmoid colon to the
posterior bladder dome consistent with developing adhesions. There
is no bowel obstruction. The appendix appears unremarkable extends
into the tethered area of colovesical adhesion.

Vascular/Lymphatic: Mild aortoiliac atherosclerotic disease. The IVC
is unremarkable. No portal venous gas. There is no adenopathy.

Reproductive: The prostate and seminal vesicles are grossly
unremarkable. The left testicle is located in the left inguinal
canal.

Other: None

Musculoskeletal: No acute or significant osseous findings.
IMPRESSION: 1. Diffuse scattered nodular densities primarily throughout the left
lung most consistent with pneumonia, likely atypical in etiology
versus aspiration. Small left pleural effusion.
2. Large hiatal hernia containing the majority of the stomach. Mild
dilatation of the lower esophagus with ingested content which may
represent reflux or delayed clearance. This can predispose to
aspiration.
3. Sigmoid diverticulosis with muscular hypertrophy. No bowel
obstruction. No evidence of perforation.
4. Focal area of linear enhancement along the right lateral bladder
wall may represent adherent debris, or an enhancing mass. Further
evaluation with cystoscopy or dedicated multiphasic CT urography on
a nonemergent/outpatient basis recommended.
5. Solitary right kidney. No hydronephrosis.
6. Aortic Atherosclerosis ([GZ]-[GZ]).

## 2021-05-30 MED ORDER — METRONIDAZOLE 500 MG/100ML IV SOLN
500.0000 mg | Freq: Once | INTRAVENOUS | Status: AC
  Administered 2021-05-30: 500 mg via INTRAVENOUS
  Filled 2021-05-30: qty 100

## 2021-05-30 MED ORDER — FENTANYL CITRATE PF 50 MCG/ML IJ SOSY
50.0000 ug | PREFILLED_SYRINGE | Freq: Once | INTRAMUSCULAR | Status: AC
  Administered 2021-05-30: 50 ug via INTRAVENOUS
  Filled 2021-05-30: qty 1

## 2021-05-30 MED ORDER — IOHEXOL 300 MG/ML  SOLN
80.0000 mL | Freq: Once | INTRAMUSCULAR | Status: AC | PRN
  Administered 2021-05-30: 80 mL via INTRAVENOUS

## 2021-05-30 MED ORDER — ACETAMINOPHEN 325 MG PO TABS
650.0000 mg | ORAL_TABLET | Freq: Once | ORAL | Status: AC
  Administered 2021-05-30: 650 mg via ORAL
  Filled 2021-05-30: qty 2

## 2021-05-30 MED ORDER — VANCOMYCIN HCL 500 MG IV SOLR
500.0000 mg | Freq: Two times a day (BID) | INTRAVENOUS | Status: DC
  Filled 2021-05-30: qty 10

## 2021-05-30 MED ORDER — FENTANYL CITRATE PF 50 MCG/ML IJ SOSY
100.0000 ug | PREFILLED_SYRINGE | Freq: Once | INTRAMUSCULAR | Status: AC
  Administered 2021-05-30: 100 ug via INTRAVENOUS
  Filled 2021-05-30: qty 2

## 2021-05-30 MED ORDER — SODIUM CHLORIDE 0.9 % IV SOLN
2.0000 g | Freq: Once | INTRAVENOUS | Status: AC
  Administered 2021-05-30: 2 g via INTRAVENOUS
  Filled 2021-05-30: qty 2

## 2021-05-30 MED ORDER — VANCOMYCIN HCL IN DEXTROSE 1-5 GM/200ML-% IV SOLN
1000.0000 mg | Freq: Once | INTRAVENOUS | Status: AC
  Administered 2021-05-30: 1000 mg via INTRAVENOUS
  Filled 2021-05-30: qty 200

## 2021-05-30 MED ORDER — LACTATED RINGERS IV SOLN: INTRAVENOUS | Status: DC

## 2021-05-30 MED ORDER — LACTATED RINGERS IV BOLUS (SEPSIS)
1000.0000 mL | Freq: Once | INTRAVENOUS | Status: AC
  Administered 2021-05-30: 1000 mL via INTRAVENOUS

## 2021-05-30 MED ORDER — LACTATED RINGERS IV BOLUS
1000.0000 mL | Freq: Once | INTRAVENOUS | Status: AC
  Administered 2021-05-30: 1000 mL via INTRAVENOUS

## 2021-05-30 MED ORDER — SODIUM CHLORIDE 0.9 % IV SOLN
2.0000 g | Freq: Two times a day (BID) | INTRAVENOUS | Status: DC
  Administered 2021-05-31 – 2021-06-01 (×2): 2 g via INTRAVENOUS
  Filled 2021-05-30 (×2): qty 2

## 2021-05-30 NOTE — ED Notes (Signed)
Patient transported to CT 

## 2021-05-30 NOTE — ED Provider Notes (Signed)
?[facility] ?Provider Note ? ? ?CSN: [id number] ?Arrival date & time: 05/09/2021  1758 ? ?  ? ?History ? ?Chief Complaint  ?Patient presents with  ? Shortness of Breath  ? ? ?Colin Hamilton is a 61 y.o. male. ? ? ?Shortness of Breath ?Associated symptoms: no abdominal pain   ?Patient presents shortness of breath and feeling weak.  History of asthma.  History of previous sepsis.  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  Had colonoscopy today through [facility] in [place].  Reportedly at the end of it was feeling short of breath and lightheaded.  States he was still discharged.  Upon arrival found to be tachypneic tachycardic and shortness of breath.  Somewhat difficult picking up pulse ox but requiring nonrebreather appears to keep oxygen up at this time.  No abdominal pain ?  ?Past Medical History:  ?Diagnosis Date  ? Asthma   ? Asthma in adult, mild intermittent, uncomplicated 0000000  ? Essential hypertension 06/05/2020  ? GERD with esophagitis 06/05/2020  ? History of alcohol abuse 06/05/2020  ? ?History reviewed. No pertinent surgical history. ? ?Home Medications ?Prior to Admission medications   ?Medication Sig Start Date End Date Taking? Authorizing Provider  ?albuterol (VENTOLIN HFA) 108 (90 Base) MCG/ACT inhaler Inhale 2 puffs into the lungs every 6 (six) hours as needed for wheezing or shortness of breath.    [provider]  ?cyclobenzaprine (FLEXERIL) 10 MG tablet Take 1 tablet (10 mg total) by mouth 3 (three) times daily as needed for muscle spasms. 07/28/20   Oswald Hillock, MD  ?docusate sodium (COLACE) 100 MG capsule Take 100 mg by mouth daily.    [provider]  ?ferrous gluconate (FERGON) 324 MG tablet Take 1 tablet (324 mg total) by mouth daily with breakfast. 07/29/20   Oswald Hillock, MD  ?Fluticasone-Umeclidin-Vilant (TRELEGY ELLIPTA) 100-62.5-25 MCG/INH AEPB Inhale 1 puff into the lungs daily.    [provider]   ?HYDROcodone-acetaminophen (NORCO/VICODIN) 5-325 MG tablet Take 1 tablet by mouth every 6 (six) hours as needed for moderate pain. 07/28/20   Oswald Hillock, MD  ?metoCLOPramide (REGLAN) 5 MG tablet Take 1 tablet (5 mg total) by mouth every 8 (eight) hours as needed for nausea. ?Patient taking differently: Take 5 mg by mouth 2 (two) times daily before a meal. 06/09/20 07/09/20  Ghimire, Henreitta Leber, MD  ?metoprolol tartrate (LOPRESSOR) 25 MG tablet Take 0.5 tablets (12.5 mg total) by mouth 2 (two) times daily. 07/28/20   Oswald Hillock, MD  ?ondansetron (ZOFRAN) 8 MG tablet Take 8 mg by mouth every 8 (eight) hours as needed for nausea or vomiting.    [provider]  ?pantoprazole (PROTONIX) 40 MG tablet Take 40 mg by mouth daily.    [provider]  ?QUEtiapine (SEROQUEL) 50 MG tablet Take 50 mg by mouth at bedtime. ?Patient not taking: No sig reported    [provider]  ?sucralfate (CARAFATE) 1 g tablet Take 1 g by mouth 2 (two) times daily before a meal.    [provider]  ?   ? ?Allergies    ?Penicillins   ? ?Review of Systems   ?Review of Systems  ?Constitutional:  Positive for appetite change.  ?Respiratory:  Positive for shortness of breath.   ?Gastrointestinal:  Negative for abdominal pain.  ?Neurological:  Positive for weakness.  ? ?Physical Exam ?Updated Vital Signs ?BP 111/73   Pulse (!) 117   Temp 100 ?F (37.8 ?  C) (Oral)   Resp 16   Wt 55.3 kg   SpO2 96%   BMI 18.55 kg/m?  ?Physical Exam ?Vitals and nursing note reviewed.  ?Cardiovascular:  ?   Rate and Rhythm: Regular rhythm. Tachycardia present.  ?Pulmonary:  ?   Breath sounds: No wheezing or rhonchi.  ?   Comments: Tachypnea.  On nonrebreather. ?Chest:  ?   Chest wall: No tenderness.  ?Abdominal:  ?   Tenderness: There is no abdominal tenderness.  ?Musculoskeletal:  ?   Cervical back: Neck supple.  ?   Right lower leg: No tenderness.  ?   Left lower leg: No tenderness.  ?Skin: ?   General: Skin is warm.  ?    Capillary Refill: Capillary refill takes less than 2 seconds.  ?Neurological:  ?   Mental Status: He is alert and oriented to person, place, and time.  ? ? ?ED Results / Procedures / Treatments   ?Labs ?(all labs ordered are listed, but only abnormal results are displayed) ?Labs Reviewed  ?CBC WITH DIFFERENTIAL/PLATELET - Abnormal; Notable for the following components:  ?    Result Value  ? WBC 13.2 (*)   ? RDW 17.2 (*)   ? Neutro Abs 11.6 (*)   ? All other components within normal limits  ?LACTIC ACID, PLASMA - Abnormal; Notable for the following components:  ? Lactic Acid, Venous 4.9 (*)   ? All other components within normal limits  ?LACTIC ACID, PLASMA - Abnormal; Notable for the following components:  ? Lactic Acid, Venous 2.5 (*)   ? All other components within normal limits  ?COMPREHENSIVE METABOLIC PANEL - Abnormal; Notable for the following components:  ? Potassium 2.9 (*)   ? Glucose, Bld 119 (*)   ? Creatinine, Ser 1.29 (*)   ? Total Protein 8.6 (*)   ? All other components within normal limits  ?URINALYSIS, ROUTINE W REFLEX MICROSCOPIC - Abnormal; Notable for the following components:  ? Color, Urine COLORLESS (*)   ? Protein, ur TRACE (*)   ? All other components within normal limits  ?I-STAT ARTERIAL BLOOD GAS, ED - Abnormal; Notable for the following components:  ? pO2, Arterial 179 (*)   ? Potassium 3.0 (*)   ? Calcium, Ion 1.14 (*)   ? HCT 38.0 (*)   ? Hemoglobin 12.9 (*)   ? All other components within normal limits  ?I-STAT ARTERIAL BLOOD GAS, ED - Abnormal; Notable for the following components:  ? pO2, Arterial 74 (*)   ? Potassium 2.5 (*)   ? HCT 35.0 (*)   ? Hemoglobin 11.9 (*)   ? All other components within normal limits  ?RESP PANEL BY RT-PCR (FLU A&B, COVID) [id number]  ?CULTURE, BLOOD (ROUTINE X 2)  ?CULTURE, BLOOD (ROUTINE X 2)  ?URINE CULTURE  ?PROTIME-INR  ?APTT  ?BLOOD GAS, ARTERIAL  ? ? ?EKG ?EKG Interpretation ? ?Date/Time:  Thursday May 30 2021 18:04:54 EDT ?Ventricular Rate:  188 ?PR  Interval:  134 ?QRS Duration: 87 ?QT Interval:  240 ?QTC Calculation: 424 ?R Axis:   82 ?Text Interpretation: narrow complex tachycardia Confirmed by Davonna Belling [phone]) on 05/11/2021 6:41:07 PM ? ?Radiology ?CT CHEST ABDOMEN PELVIS W CONTRAST ? ?Result Date: 05/03/2021 ?CLINICAL DATA:  Sepsis. Status post colonoscopy. Chest pain and shortness of breath. EXAM: CT CHEST, ABDOMEN, AND PELVIS WITH CONTRAST TECHNIQUE: Multidetector CT imaging of the chest, abdomen and pelvis was performed following the standard protocol during bolus administration of intravenous contrast. RADIATION DOSE REDUCTION: This  exam was performed according to the departmental dose-optimization program which includes automated exposure control, adjustment of the mA and/or kV according to patient size and/or use of iterative reconstruction technique. CONTRAST:  90mL OMNIPAQUE IOHEXOL 300 MG/ML  SOLN COMPARISON:  Chest radiograph dated 05/25/2021 and CT dated 05/21/2021. FINDINGS: CT CHEST FINDINGS Cardiovascular: There is no cardiomegaly or pericardial effusion. There is coronary vascular calcification. Mild atherosclerotic calcification of the thoracic aorta. No aneurysmal dilatation or dissection. The origins of the great vessels of the aortic arch appear patent as visualized. The central pulmonary arteries are patent. Mediastinum/Nodes: No hilar or mediastinal adenopathy. There is a large hiatal hernia containing the majority of the stomach. Mild dilatation of the lower esophagus with ingested content which may represent reflux or delayed clearance. This can predispose to aspiration. No mediastinal fluid collection. Status post prior left hemithyroidectomy. Lungs/Pleura: Diffuse scattered nodular densities primarily throughout the left lung most consistent with pneumonia, likely atypical in etiology versus aspiration. Scattered clusters of ground-glass subpleural density noted in the right upper lobe. There is partial compressive  atelectasis of the right lower lobe due to mass effect caused by the large hiatal hernia. Partial left lung base consolidation may represent atelectasis or infiltrate. There is a small left pleural effusion. No pneumothora

## 2021-05-30 NOTE — Progress Notes (Signed)
Pharmacy Antibiotic Note ? ?Colin Hamilton is a 61 y.o. male admitted on 05/31/21 presenting with SOB, fever, tachycardia, concern for sepsis.  Pharmacy has been consulted for vancomycin and cefepime dosing. ? ?Plan: ?Vancomycin 1g IV x 1, then 500 mg IV q 12 hours (eAUC 434, Goal AUC 400-550, SCr 1.29) ?Cefepime 2g IV q 12 hours ?Monitor renal function, Cx and clinical progression to narrow ?Vancomycin levels as indicated ? ?  ? ?Temp (24hrs), Avg:104.7 ?F (40.4 ?C), Min:104.7 ?F (40.4 ?C), Max:104.7 ?F (40.4 ?C) ? ?Recent Labs  ?Lab 05/31/21 ?1813  ?WBC 13.2*  ?CREATININE 1.29*  ?LATICACIDVEN 4.9*  ?  ?Estimated Creatinine Clearance: 55.4 mL/min (A) (by C-G formula based on SCr of 1.29 mg/dL (H)).   ? ?Allergies  ?Allergen Reactions  ? Penicillins Swelling  ? ? ?Daylene Posey, PharmD ?Clinical Pharmacist ?ED Pharmacist Phone # [phone] ?2021/05/31 7:05 PM ? ? ?

## 2021-05-30 NOTE — Sepsis Progress Note (Signed)
Sent message to bedside RN via secure chat requesting current weight to be entered into Epic for IVF requirement calculation. ?

## 2021-05-30 NOTE — Progress Notes (Signed)
Elink following code sepsis °

## 2021-05-30 NOTE — ED Notes (Addendum)
Repeat lactic 2.5 // MD aware  ?

## 2021-05-30 NOTE — ED Triage Notes (Signed)
Pt post op for coloscopy today at 1600. Pt started experiencing  SOB and chest pain in recovery. Pt was discharged went home for 30 min and then came to the ED. Pt  ?

## 2021-05-30 NOTE — ED Notes (Signed)
MD aware of K 2.9  ?

## 2021-05-30 NOTE — ED Notes (Signed)
RT placed pt on BIPAP NIV/PCV 10/5, 21%, BUR 15. Pt tolerating fairly well, respiratory status stable on NIV w/no distress noted. Pt however, began to desaturate after receiving fentanyl. RT increased FIO2 to 40%, pts SPO2 came up from 83% to 95% at this time. RT will continue to monitor.  ?

## 2021-05-30 NOTE — ED Notes (Signed)
Pt started to STAT started to drop to 83-84% on bipap after receiving fentanyl // RT made aware and at bedside  ?

## 2021-05-31 ENCOUNTER — Encounter (HOSPITAL_COMMUNITY): Payer: Self-pay | Admitting: Internal Medicine

## 2021-05-31 DIAGNOSIS — E871 Hypo-osmolality and hyponatremia: Secondary | ICD-10-CM | POA: Diagnosis not present

## 2021-05-31 DIAGNOSIS — J189 Pneumonia, unspecified organism: Secondary | ICD-10-CM | POA: Diagnosis not present

## 2021-05-31 DIAGNOSIS — G40911 Epilepsy, unspecified, intractable, with status epilepticus: Secondary | ICD-10-CM | POA: Diagnosis not present

## 2021-05-31 DIAGNOSIS — F1010 Alcohol abuse, uncomplicated: Secondary | ICD-10-CM | POA: Diagnosis present

## 2021-05-31 DIAGNOSIS — Z7189 Other specified counseling: Secondary | ICD-10-CM | POA: Diagnosis not present

## 2021-05-31 DIAGNOSIS — R6521 Severe sepsis with septic shock: Secondary | ICD-10-CM | POA: Diagnosis not present

## 2021-05-31 DIAGNOSIS — G8194 Hemiplegia, unspecified affecting left nondominant side: Secondary | ICD-10-CM | POA: Diagnosis not present

## 2021-05-31 DIAGNOSIS — Z66 Do not resuscitate: Secondary | ICD-10-CM | POA: Diagnosis not present

## 2021-05-31 DIAGNOSIS — A419 Sepsis, unspecified organism: Secondary | ICD-10-CM | POA: Diagnosis not present

## 2021-05-31 DIAGNOSIS — N182 Chronic kidney disease, stage 2 (mild): Secondary | ICD-10-CM | POA: Diagnosis present

## 2021-05-31 DIAGNOSIS — J80 Acute respiratory distress syndrome: Secondary | ICD-10-CM | POA: Diagnosis not present

## 2021-05-31 DIAGNOSIS — R569 Unspecified convulsions: Secondary | ICD-10-CM | POA: Diagnosis not present

## 2021-05-31 DIAGNOSIS — R0603 Acute respiratory distress: Secondary | ICD-10-CM | POA: Diagnosis not present

## 2021-05-31 DIAGNOSIS — G40901 Epilepsy, unspecified, not intractable, with status epilepticus: Secondary | ICD-10-CM | POA: Diagnosis not present

## 2021-05-31 DIAGNOSIS — D696 Thrombocytopenia, unspecified: Secondary | ICD-10-CM | POA: Diagnosis not present

## 2021-05-31 DIAGNOSIS — I130 Hypertensive heart and chronic kidney disease with heart failure and stage 1 through stage 4 chronic kidney disease, or unspecified chronic kidney disease: Secondary | ICD-10-CM | POA: Diagnosis present

## 2021-05-31 DIAGNOSIS — K760 Fatty (change of) liver, not elsewhere classified: Secondary | ICD-10-CM | POA: Diagnosis present

## 2021-05-31 DIAGNOSIS — G9341 Metabolic encephalopathy: Secondary | ICD-10-CM | POA: Diagnosis not present

## 2021-05-31 DIAGNOSIS — K449 Diaphragmatic hernia without obstruction or gangrene: Secondary | ICD-10-CM | POA: Diagnosis not present

## 2021-05-31 DIAGNOSIS — R4182 Altered mental status, unspecified: Secondary | ICD-10-CM | POA: Diagnosis not present

## 2021-05-31 DIAGNOSIS — R109 Unspecified abdominal pain: Secondary | ICD-10-CM

## 2021-05-31 DIAGNOSIS — E872 Acidosis, unspecified: Secondary | ICD-10-CM | POA: Diagnosis not present

## 2021-05-31 DIAGNOSIS — Q600 Renal agenesis, unilateral: Secondary | ICD-10-CM | POA: Diagnosis not present

## 2021-05-31 DIAGNOSIS — E873 Alkalosis: Secondary | ICD-10-CM | POA: Diagnosis not present

## 2021-05-31 DIAGNOSIS — Z9889 Other specified postprocedural states: Secondary | ICD-10-CM

## 2021-05-31 DIAGNOSIS — Z515 Encounter for palliative care: Secondary | ICD-10-CM | POA: Diagnosis not present

## 2021-05-31 DIAGNOSIS — Z20822 Contact with and (suspected) exposure to covid-19: Secondary | ICD-10-CM | POA: Diagnosis not present

## 2021-05-31 DIAGNOSIS — J9601 Acute respiratory failure with hypoxia: Secondary | ICD-10-CM | POA: Diagnosis present

## 2021-05-31 DIAGNOSIS — E43 Unspecified severe protein-calorie malnutrition: Secondary | ICD-10-CM | POA: Diagnosis not present

## 2021-05-31 DIAGNOSIS — J9600 Acute respiratory failure, unspecified whether with hypoxia or hypercapnia: Secondary | ICD-10-CM | POA: Insufficient documentation

## 2021-05-31 DIAGNOSIS — I6340 Cerebral infarction due to embolism of unspecified cerebral artery: Secondary | ICD-10-CM | POA: Diagnosis not present

## 2021-05-31 DIAGNOSIS — J690 Pneumonitis due to inhalation of food and vomit: Secondary | ICD-10-CM | POA: Diagnosis not present

## 2021-05-31 DIAGNOSIS — F419 Anxiety disorder, unspecified: Secondary | ICD-10-CM

## 2021-05-31 DIAGNOSIS — R6520 Severe sepsis without septic shock: Secondary | ICD-10-CM | POA: Diagnosis not present

## 2021-05-31 DIAGNOSIS — D631 Anemia in chronic kidney disease: Secondary | ICD-10-CM | POA: Diagnosis present

## 2021-05-31 DIAGNOSIS — I5032 Chronic diastolic (congestive) heart failure: Secondary | ICD-10-CM | POA: Diagnosis present

## 2021-05-31 DIAGNOSIS — R402434 Glasgow coma scale score 3-8, 24 hours or more after hospital admission: Secondary | ICD-10-CM | POA: Diagnosis not present

## 2021-05-31 DIAGNOSIS — N179 Acute kidney failure, unspecified: Secondary | ICD-10-CM | POA: Diagnosis not present

## 2021-05-31 DIAGNOSIS — J4520 Mild intermittent asthma, uncomplicated: Secondary | ICD-10-CM | POA: Diagnosis present

## 2021-05-31 LAB — CBC WITH DIFFERENTIAL/PLATELET
Abs Immature Granulocytes: 0.12 10*3/uL — ABNORMAL HIGH (ref 0.00–0.07)
Basophils Absolute: 0.1 10*3/uL (ref 0.0–0.1)
Basophils Relative: 0 %
Eosinophils Absolute: 0 10*3/uL (ref 0.0–0.5)
Eosinophils Relative: 0 %
HCT: 31.3 % — ABNORMAL LOW (ref 39.0–52.0)
Hemoglobin: 10 g/dL — ABNORMAL LOW (ref 13.0–17.0)
Immature Granulocytes: 1 %
Lymphocytes Relative: 5 %
Lymphs Abs: 0.9 10*3/uL (ref 0.7–4.0)
MCH: 28.6 pg (ref 26.0–34.0)
MCHC: 31.9 g/dL (ref 30.0–36.0)
MCV: 89.4 fL (ref 80.0–100.0)
Monocytes Absolute: 1.5 10*3/uL — ABNORMAL HIGH (ref 0.1–1.0)
Monocytes Relative: 8 %
Neutro Abs: 15.6 10*3/uL — ABNORMAL HIGH (ref 1.7–7.7)
Neutrophils Relative %: 86 %
Platelets: 152 10*3/uL (ref 150–400)
RBC: 3.5 MIL/uL — ABNORMAL LOW (ref 4.22–5.81)
RDW: 17.3 % — ABNORMAL HIGH (ref 11.5–15.5)
WBC: 18.2 10*3/uL — ABNORMAL HIGH (ref 4.0–10.5)
nRBC: 0 % (ref 0.0–0.2)

## 2021-05-31 LAB — BLOOD GAS, ARTERIAL
Acid-Base Excess: 3.8 mmol/L — ABNORMAL HIGH (ref 0.0–2.0)
Bicarbonate: 27.7 mmol/L (ref 20.0–28.0)
Expiratory PAP: 5 cmH2O
FIO2: 30 %
Inspiratory PAP: 12 cmH2O
Mode: POSITIVE
O2 Saturation: 98.2 %
Patient temperature: 37
RATE: 16 resp/min
pCO2 arterial: 38 mmHg (ref 32–48)
pH, Arterial: 7.47 — ABNORMAL HIGH (ref 7.35–7.45)
pO2, Arterial: 107 mmHg (ref 83–108)

## 2021-05-31 LAB — MAGNESIUM
Magnesium: 1.1 mg/dL — ABNORMAL LOW (ref 1.7–2.4)
Magnesium: 2.4 mg/dL (ref 1.7–2.4)

## 2021-05-31 LAB — RENAL FUNCTION PANEL
Albumin: 2.7 g/dL — ABNORMAL LOW (ref 3.5–5.0)
Anion gap: 8 (ref 5–15)
BUN: 11 mg/dL (ref 6–20)
CO2: 27 mmol/L (ref 22–32)
Calcium: 8.1 mg/dL — ABNORMAL LOW (ref 8.9–10.3)
Chloride: 100 mmol/L (ref 98–111)
Creatinine, Ser: 1.25 mg/dL — ABNORMAL HIGH (ref 0.61–1.24)
GFR, Estimated: >60 mL/min (ref >60)
Glucose, Bld: 98 mg/dL (ref 70–99)
Phosphorus: 2.8 mg/dL (ref 2.5–4.6)
Potassium: 3.1 mmol/L — ABNORMAL LOW (ref 3.5–5.1)
Sodium: 135 mmol/L (ref 135–145)

## 2021-05-31 LAB — COMPREHENSIVE METABOLIC PANEL
ALT: 8 U/L (ref 0–44)
AST: 17 U/L (ref 15–41)
Albumin: 2.9 g/dL — ABNORMAL LOW (ref 3.5–5.0)
Alkaline Phosphatase: 50 U/L (ref 38–126)
Anion gap: 7 (ref 5–15)
BUN: 11 mg/dL (ref 6–20)
CO2: 28 mmol/L (ref 22–32)
Calcium: 7.9 mg/dL — ABNORMAL LOW (ref 8.9–10.3)
Chloride: 99 mmol/L (ref 98–111)
Creatinine, Ser: 1.27 mg/dL — ABNORMAL HIGH (ref 0.61–1.24)
GFR, Estimated: >60 mL/min (ref >60)
Glucose, Bld: 110 mg/dL — ABNORMAL HIGH (ref 70–99)
Potassium: 3.3 mmol/L — ABNORMAL LOW (ref 3.5–5.1)
Sodium: 134 mmol/L — ABNORMAL LOW (ref 135–145)
Total Bilirubin: 0.5 mg/dL (ref 0.3–1.2)
Total Protein: 6.1 g/dL — ABNORMAL LOW (ref 6.5–8.1)

## 2021-05-31 LAB — MRSA NEXT GEN BY PCR, NASAL: MRSA by PCR Next Gen: NOT DETECTED

## 2021-05-31 LAB — STREP PNEUMONIAE URINARY ANTIGEN: Strep Pneumo Urinary Antigen: NEGATIVE

## 2021-05-31 LAB — LIPASE, BLOOD: Lipase: 19 U/L (ref 11–51)

## 2021-05-31 LAB — LACTIC ACID, PLASMA
Lactic Acid, Venous: 1.7 mmol/L (ref 0.5–1.9)
Lactic Acid, Venous: 2.2 mmol/L (ref 0.5–1.9)

## 2021-05-31 MED ORDER — ACETAMINOPHEN 650 MG RE SUPP: 650.0000 mg | Freq: Four times a day (QID) | RECTAL | Status: DC | PRN

## 2021-05-31 MED ORDER — CHLORHEXIDINE GLUCONATE 0.12 % MT SOLN
15.0000 mL | Freq: Two times a day (BID) | OROMUCOSAL | Status: DC
  Administered 2021-05-31 – 2021-06-02 (×5): 15 mL via OROMUCOSAL
  Filled 2021-05-31 (×4): qty 15

## 2021-05-31 MED ORDER — POTASSIUM CHLORIDE 10 MEQ/100ML IV SOLN
10.0000 meq | INTRAVENOUS | Status: AC
  Administered 2021-05-31 (×2): 10 meq via INTRAVENOUS
  Filled 2021-05-31 (×2): qty 100

## 2021-05-31 MED ORDER — HYDRALAZINE HCL 20 MG/ML IJ SOLN: 10.0000 mg | Freq: Three times a day (TID) | INTRAMUSCULAR | Status: DC | PRN

## 2021-05-31 MED ORDER — ACETAMINOPHEN 325 MG PO TABS: 650.0000 mg | ORAL_TABLET | Freq: Four times a day (QID) | ORAL | Status: DC | PRN

## 2021-05-31 MED ORDER — ENOXAPARIN SODIUM 40 MG/0.4ML IJ SOSY
40.0000 mg | PREFILLED_SYRINGE | INTRAMUSCULAR | Status: DC
  Administered 2021-05-31 – 2021-06-12 (×13): 40 mg via SUBCUTANEOUS
  Filled 2021-05-31 (×13): qty 0.4

## 2021-05-31 MED ORDER — MAGNESIUM SULFATE 4 GM/100ML IV SOLN
4.0000 g | Freq: Once | INTRAVENOUS | Status: AC
  Administered 2021-05-31: 4 g via INTRAVENOUS
  Filled 2021-05-31 (×2): qty 100

## 2021-05-31 MED ORDER — PANTOPRAZOLE SODIUM 40 MG IV SOLR
40.0000 mg | Freq: Two times a day (BID) | INTRAVENOUS | Status: DC
  Administered 2021-05-31 – 2021-06-06 (×14): 40 mg via INTRAVENOUS
  Filled 2021-05-31 (×13): qty 10

## 2021-05-31 MED ORDER — ORAL CARE MOUTH RINSE
15.0000 mL | Freq: Two times a day (BID) | OROMUCOSAL | Status: DC
  Administered 2021-05-31 – 2021-06-02 (×3): 15 mL via OROMUCOSAL

## 2021-05-31 MED ORDER — FENTANYL CITRATE PF 50 MCG/ML IJ SOSY
25.0000 ug | PREFILLED_SYRINGE | Freq: Once | INTRAMUSCULAR | Status: AC
  Administered 2021-05-31: 25 ug via INTRAVENOUS
  Filled 2021-05-31: qty 1

## 2021-05-31 MED ORDER — POTASSIUM CHLORIDE 10 MEQ/100ML IV SOLN: 10.0000 meq | INTRAVENOUS | Status: DC

## 2021-05-31 MED ORDER — LACTATED RINGERS IV SOLN: INTRAVENOUS | Status: DC

## 2021-05-31 MED ORDER — CHLORHEXIDINE GLUCONATE CLOTH 2 % EX PADS
6.0000 | MEDICATED_PAD | Freq: Every day | CUTANEOUS | Status: DC
  Administered 2021-05-31 – 2021-06-13 (×13): 6 via TOPICAL

## 2021-05-31 MED ORDER — POTASSIUM CHLORIDE 10 MEQ/100ML IV SOLN
10.0000 meq | INTRAVENOUS | Status: AC
  Administered 2021-05-31: 10 meq via INTRAVENOUS
  Filled 2021-05-31 (×3): qty 100

## 2021-05-31 MED ORDER — PROCHLORPERAZINE EDISYLATE 10 MG/2ML IJ SOLN
10.0000 mg | Freq: Four times a day (QID) | INTRAMUSCULAR | Status: DC | PRN
  Administered 2021-05-31 – 2021-06-01 (×2): 10 mg via INTRAVENOUS
  Filled 2021-05-31 (×4): qty 2

## 2021-05-31 MED ORDER — IPRATROPIUM-ALBUTEROL 0.5-2.5 (3) MG/3ML IN SOLN
3.0000 mL | Freq: Four times a day (QID) | RESPIRATORY_TRACT | Status: DC
  Administered 2021-05-31 – 2021-06-01 (×4): 3 mL via RESPIRATORY_TRACT
  Filled 2021-05-31 (×4): qty 3

## 2021-05-31 MED ORDER — MORPHINE SULFATE (PF) 4 MG/ML IV SOLN
4.0000 mg | INTRAVENOUS | Status: DC | PRN
  Administered 2021-05-31 – 2021-06-01 (×7): 4 mg via INTRAVENOUS
  Filled 2021-05-31 (×8): qty 1

## 2021-05-31 MED ORDER — ONDANSETRON HCL 4 MG/2ML IJ SOLN
4.0000 mg | Freq: Once | INTRAMUSCULAR | Status: AC
  Administered 2021-05-31: 4 mg via INTRAVENOUS

## 2021-05-31 MED ORDER — POTASSIUM CHLORIDE 10 MEQ/100ML IV SOLN
10.0000 meq | INTRAVENOUS | Status: AC
  Administered 2021-05-31 – 2021-06-01 (×4): 10 meq via INTRAVENOUS
  Filled 2021-05-31 (×4): qty 100

## 2021-05-31 MED ORDER — VANCOMYCIN HCL 500 MG/100ML IV SOLN
500.0000 mg | Freq: Two times a day (BID) | INTRAVENOUS | Status: DC
  Administered 2021-05-31 (×2): 500 mg via INTRAVENOUS
  Filled 2021-05-31 (×3): qty 100

## 2021-05-31 NOTE — Consult Note (Signed)
? ? ? ? ?Consult Note ? ?Colin Hamilton ?04-22-1960  ?[id number].   ? ?Requesting MD: Dr. Marylyn Ishihara ?Chief Complaint/Reason for Consult: abdominal pain ? ?HPI:  ?61 year old male with medical history significant for asthma, HTN, GERD, diverticulitis, severe esophagitis, HH who presented to [facility] ED on 3/30 with shortness of breath and weakness. He underwent colonoscopy in [place] earlier that day as part of work up of sigmoid colon bowel wall thickening seen on a prior CT (05/21/21). Records of colonoscopy are not available but per patient he was told he did not have a mass but there was an area they could not pass. He has had diffuse abdominal pain since prior to admission and colonoscopy but states it has worsened since c-scope. He underwent prep with c-scope and last BM this am. He does have nausea and emesis today.  ? ?Work up in ED significant for CT ch/abd/pel showing aspiration pneumonia, hiatal hernia, sigmoid diverticulosis with likely adhesion to bladder without evidence of bowel obstruction or perforation. He was admitted to the hospitalist service. ? ?He also reports new and progressively worsening ecchymosis of his back that is spreading laterally ? ?Substance use: alcohol ?Allergies: PCN - swelling ? ?ROS: ?Review of Systems  ?Constitutional:  Positive for malaise/fatigue. Negative for chills and fever.  ?Respiratory:  Positive for shortness of breath. Negative for cough.   ?Cardiovascular:  Negative for chest pain, palpitations and leg swelling.  ?Gastrointestinal:  Positive for abdominal pain, nausea and vomiting.  ? ?Family History  ?Problem Relation Age of Onset  ? Cancer Sister   ? ? ?Past Medical History:  ?Diagnosis Date  ? Asthma   ? Asthma in adult, mild intermittent, uncomplicated 0000000  ? Essential hypertension 06/05/2020  ? GERD with esophagitis 06/05/2020  ? History of alcohol abuse 06/05/2020  ? ? ?History reviewed. No pertinent surgical history. ? ?Social History:  reports  that he has quit smoking. He has never used smokeless tobacco. He reports current alcohol use. He reports current drug use. Drug: Marijuana. ? ?Allergies:  ?Allergies  ?Allergen Reactions  ? Penicillins Swelling  ? ? ?Medications Prior to Admission  ?Medication Sig Dispense Refill  ? albuterol (VENTOLIN HFA) 108 (90 Base) MCG/ACT inhaler Inhale 2 puffs into the lungs every 6 (six) hours as needed for wheezing or shortness of breath.    ? cyclobenzaprine (FLEXERIL) 10 MG tablet Take 1 tablet (10 mg total) by mouth 3 (three) times daily as needed for muscle spasms. 30 tablet 0  ? docusate sodium (COLACE) 100 MG capsule Take 100 mg by mouth daily.    ? ferrous gluconate (FERGON) 324 MG tablet Take 1 tablet (324 mg total) by mouth daily with breakfast. 30 tablet 0  ? Fluticasone-Umeclidin-Vilant (TRELEGY ELLIPTA) 100-62.5-25 MCG/INH AEPB Inhale 1 puff into the lungs daily.    ? HYDROcodone-acetaminophen (NORCO/VICODIN) 5-325 MG tablet Take 1 tablet by mouth every 6 (six) hours as needed for moderate pain. 20 tablet 0  ? metoCLOPramide (REGLAN) 5 MG tablet Take 1 tablet (5 mg total) by mouth every 8 (eight) hours as needed for nausea. (Patient taking differently: Take 5 mg by mouth 2 (two) times daily before a meal.) 30 tablet 0  ? metoprolol tartrate (LOPRESSOR) 25 MG tablet Take 0.5 tablets (12.5 mg total) by mouth 2 (two) times daily. 60 tablet 3  ? ondansetron (ZOFRAN) 8 MG tablet Take 8 mg by mouth every 8 (eight) hours as needed for nausea or vomiting.    ? pantoprazole (PROTONIX)  40 MG tablet Take 40 mg by mouth daily.    ? QUEtiapine (SEROQUEL) 50 MG tablet Take 50 mg by mouth at bedtime. (Patient not taking: No sig reported)    ? sucralfate (CARAFATE) 1 g tablet Take 1 g by mouth 2 (two) times daily before a meal.    ? ? ?Blood pressure 134/90, pulse (!) 106, temperature 100 ?F (37.8 ?C), temperature source Oral, resp. rate (!) 32, height 5\' 4"  (1.626 m), weight 61.2 kg, SpO2 100 %. ?Physical Exam: ?General:  pleasant, WD, male who is laying in bed in NAD ?HEENT: head is normocephalic, atraumatic.  Sclera are noninjected.  Pupils equal and round. EOMs intact.  Ears and nose without any masses or lesions.  Mouth is pink and moist ?Heart: regular, rate, and rhythm.  Normal s1,s2. No obvious murmurs, gallops, or rubs noted.  Palpable radial and pedal pulses bilaterally ?Lungs:  Respiratory effort nonlabored on bipap ?Abd: soft, ND, mild diffuse TTP without rebound or gaurding ?MSK: all 4 extremities are symmetrical with no cyanosis, clubbing, or edema. ?Skin: warm and dry ?Neuro: Cranial nerves 2-12 grossly intact, sensation is normal throughout ?Psych: A&Ox3 with an appropriate affect.  ? ? ?Results for orders placed or performed during the hospital encounter of 05/08/2021 (from the past 48 hour(s))  ?Culture, blood (routine x 2)     Status: None (Preliminary result)  ? Collection Time: 05/12/2021  6:12 PM  ? Specimen: BLOOD  ?Result Value Ref Range  ? Specimen Description    ?  BLOOD BLOOD RIGHT WRIST ?Performed at [facility], [address], [place], [place] [address] ?  ? Special Requests    ?  Blood Culture adequate volume BOTTLES DRAWN AEROBIC AND ANAEROBIC ?Performed at [facility], [address], [place], [place] [address] ?  ? Culture    ?  NO GROWTH < 12 HOURS ?Performed at [facility], [place] [address], [place], [place] [address] ?  ? Report Status PENDING   ?CBC with Differential     Status: Abnormal  ? Collection Time: 05/29/2021  6:13 PM  ?Result Value Ref Range  ? WBC 13.2 (H) 4.0 - 10.5 K/uL  ? RBC 4.87 4.22 - 5.81 MIL/uL  ? Hemoglobin 13.5 13.0 - 17.0 g/dL  ? HCT 43.8 39.0 - 52.0 %  ? MCV 89.9 80.0 - 100.0 fL  ? MCH 27.7 26.0 - 34.0 pg  ? MCHC 30.8 30.0 - 36.0 g/dL  ? RDW 17.2 (H) 11.5 - 15.5 %  ? Platelets 201 150 - 400 K/uL  ? nRBC 0.0 0.0 - 0.2 %  ? Neutrophils Relative % 87 %  ? Neutro Abs 11.6 (H) 1.7 - 7.7 K/uL  ? Lymphocytes Relative 7 %  ? Lymphs Abs 0.9  0.7 - 4.0 K/uL  ? Monocytes Relative 4 %  ? Monocytes Absolute 0.6 0.1 - 1.0 K/uL  ? Eosinophils Relative 1 %  ? Eosinophils Absolute 0.1 0.0 - 0.5 K/uL  ? Basophils Relative 0 %  ? Basophils Absolute 0.0 0.0 - 0.1 K/uL  ? Immature Granulocytes 1 %  ? Abs Immature Granulocytes 0.06 0.00 - 0.07 K/uL  ?  Comment: Performed at [facility], [address] [place], [place] [address]  ?Lactic acid, plasma     Status: Abnormal  ? Collection Time: 05/09/2021  6:13 PM  ?Result Value Ref Range  ? Lactic Acid, Venous 4.9 (HH) 0.5 - 1.9 mmol/L  ?  Comment: CRITICAL RESULT CALLED TO,  READ BACK BY AND VERIFIED WITH: ?Veneda Melter [id number] 05/06/2021 DBRADLEY ?Performed at [facility], [address], [place], [place] [address] ?  ?Comprehensive metabolic panel     Status: Abnormal  ? Collection Time: 05/14/2021  6:13 PM  ?Result Value Ref Range  ? Sodium 139 135 - 145 mmol/L  ? Potassium 2.9 (L) 3.5 - 5.1 mmol/L  ? Chloride 100 98 - 111 mmol/L  ? CO2 24 22 - 32 mmol/L  ? Glucose, Bld 119 (H) 70 - 99 mg/dL  ?  Comment: Glucose reference range applies only to samples taken after fasting for at least 8 hours.  ? BUN 8 6 - 20 mg/dL  ? Creatinine, Ser 1.29 (H) 0.61 - 1.24 mg/dL  ? Calcium 9.2 8.9 - 10.3 mg/dL  ? Total Protein 8.6 (H) 6.5 - 8.1 g/dL  ? Albumin 4.6 3.5 - 5.0 g/dL  ? AST 16 15 - 41 U/L  ? ALT 7 0 - 44 U/L  ? Alkaline Phosphatase 90 38 - 126 U/L  ? Total Bilirubin 0.6 0.3 - 1.2 mg/dL  ? GFR, Estimated >60 >60 mL/min  ?  Comment: (NOTE) ?Calculated using the CKD-EPI Creatinine Equation (2021) ?  ? Anion gap 15 5 - 15  ?  Comment: Performed at [facility], [address], [place], [place] [address]  ?Protime-INR     Status: None  ? Collection Time: 05/23/2021  6:13 PM  ?Result Value Ref Range  ? Prothrombin Time 12.7 11.4 - 15.2 seconds  ? INR 1.0 0.8 - 1.2  ?  Comment: (NOTE) ?INR goal varies based on device and disease states. ?Performed at [facility], [address], ?[place], [place] [address] ?  ?APTT     Status: None  ? Collection Time: 05/24/2021  6:13 PM  ?Result Value Ref Range  ? aPTT 28 24 - 36 seconds  ?  Comment: Performed at [facility]

## 2021-05-31 NOTE — Progress Notes (Signed)
Patient was taken off BIPAP by physician, Rt went into room and placed patient on 3L [place]. Patient is currently tolerating well at this time. The patient had told the physician he was going to vomit. Rt will continue to monitor ?

## 2021-05-31 NOTE — Evaluation (Signed)
SLP Cancellation Note ? ?Patient Details ?Name: Colin Hamilton ?MRN: [id number] ?DOB: [birth year] ? ? ?Cancelled treatment:       Reason Eval/Treat Not Completed: Other (comment);Medical issues which prohibited therapy (SLP spoke to RN who reports everytime pt is awake he is vomiting and he has tachypnea but unable to go on Bipap due to n/v.)  ? ?Per CT chest pt has a large hiatal hernia containing majority of this stomach and compressive ATX.  ?Mild dilatation of the lower esophagus with ingested content which may  represent reflux or delayed clearance. This can predispose to aspiration.- Thus suspect this is his primary source of aspiration risk.  ? ?Will continue efforts.  ? ?Chales Abrahams ?05/31/2021, 6:18 PM ? ? ? ?

## 2021-05-31 NOTE — ED Notes (Signed)
RT obtained ABG on pt with the following results. MD Pickering notified of results and all critical values. RT will continue to monitor.  ? ? Latest Reference Range & Units Most Recent  ?Sample type  ARTERIAL ?05/10/2021 22:17  ?pH, Arterial 7.35 - 7.45  7.448 ?05/20/2021 22:17  ?pCO2 arterial 32 - 48 mmHg 37.0 ?05/21/2021 22:17  ?pO2, Arterial 83 - 108 mmHg 74 (L) ?05/16/2021 22:17  ?TCO2 22 - 32 mmol/L 27 ?05/21/2021 22:17  ?Acid-Base Excess 0.0 - 2.0 mmol/L 2.0 ?05/26/2021 22:17  ?Acid-base deficit 0.0 - 2.0 mmol/L 2.0 ?05/04/2021 18:31  ?Bicarbonate 20.0 - 28.0 mmol/L 25.4 ?05/06/2021 22:17  ?O2 Saturation % 95 ?05/06/2021 22:17  ?Patient temperature  100.0 F ?05/15/2021 22:17  ?Collection site  RADIAL, ALLEN'S TEST ACCEPTABLE ?05/13/2021 22:17  ? ?

## 2021-05-31 NOTE — ED Notes (Signed)
MD made aware of elevated temp and that pt is c/o generalized pain with a rating 10/10 ? ?VO fentanyl and tylenol 650mg  given  ?

## 2021-05-31 NOTE — H&P (Signed)
?History and Physical  ? ? ?Patient: Colin Hamilton GQQ:[id number] DOB: 25-Dec-1960 ?DOA: 06/25/2021 ?DOS: the patient was seen and examined on 05/31/2021 ?PCP: Pcp, No  ?Patient coming from: Home ? ?Chief Complaint:  ?Chief Complaint  ?Patient presents with  ? Shortness of Breath  ? ?HPI: Colin Hamilton is a 61 y.o. male with medical history significant of HTN, asthma, anxiety, Barrett's esophagus, esophagectomy w/ gastric pull through. Presenting with dyspnea. He reports that he had a colonscopy yesterday in [place]. He was sent home after the procedure. After getting home he had diffuse abdominal pain and shortness of breath. He did not have any fevers. He does not believe he had any vomiting. His symptoms became too intense for him, so he decided to come to the ED for assistance.  ? ?Review of Systems: As mentioned in the history of present illness. All other systems reviewed and are negative. ?Past Medical History:  ?Diagnosis Date  ? Asthma   ? Asthma in adult, mild intermittent, uncomplicated 06/05/2020  ? Essential hypertension 06/05/2020  ? GERD with esophagitis 06/05/2020  ? History of alcohol abuse 06/05/2020  ? ?History reviewed. No pertinent surgical history. ?Social History:  reports that he has quit smoking. He has never used smokeless tobacco. He reports current alcohol use. He reports current drug use. Drug: Marijuana. ? ?Allergies  ?Allergen Reactions  ? Penicillins Swelling  ? ? ?Family History  ?Problem Relation Age of Onset  ? Cancer Sister   ? ? ?Prior to Admission medications   ?Medication Sig Start Date End Date Taking? Authorizing Provider  ?albuterol (VENTOLIN HFA) 108 (90 Base) MCG/ACT inhaler Inhale 2 puffs into the lungs every 6 (six) hours as needed for wheezing or shortness of breath.    [provider]  ?cyclobenzaprine (FLEXERIL) 10 MG tablet Take 1 tablet (10 mg total) by mouth 3 (three) times daily as needed for muscle spasms. 07/28/20   Meredeth Ide, MD  ?docusate sodium  (COLACE) 100 MG capsule Take 100 mg by mouth daily.    [provider]  ?ferrous gluconate (FERGON) 324 MG tablet Take 1 tablet (324 mg total) by mouth daily with breakfast. 07/29/20   Meredeth Ide, MD  ?Fluticasone-Umeclidin-Vilant (TRELEGY ELLIPTA) 100-62.5-25 MCG/INH AEPB Inhale 1 puff into the lungs daily.    [provider]  ?HYDROcodone-acetaminophen (NORCO/VICODIN) 5-325 MG tablet Take 1 tablet by mouth every 6 (six) hours as needed for moderate pain. 07/28/20   Meredeth Ide, MD  ?metoCLOPramide (REGLAN) 5 MG tablet Take 1 tablet (5 mg total) by mouth every 8 (eight) hours as needed for nausea. ?Patient taking differently: Take 5 mg by mouth 2 (two) times daily before a meal. 06/09/20 07/09/20  Ghimire, Werner Lean, MD  ?metoprolol tartrate (LOPRESSOR) 25 MG tablet Take 0.5 tablets (12.5 mg total) by mouth 2 (two) times daily. 07/28/20   Meredeth Ide, MD  ?ondansetron (ZOFRAN) 8 MG tablet Take 8 mg by mouth every 8 (eight) hours as needed for nausea or vomiting.    [provider]  ?pantoprazole (PROTONIX) 40 MG tablet Take 40 mg by mouth daily.    [provider]  ?QUEtiapine (SEROQUEL) 50 MG tablet Take 50 mg by mouth at bedtime. ?Patient not taking: No sig reported    [provider]  ?sucralfate (CARAFATE) 1 g tablet Take 1 g by mouth 2 (two) times daily before a meal.    [provider]  ? ? ?Physical Exam: ?Vitals:  ? 05/31/21 0700 05/31/21 0717  05/31/21 0800 05/31/21 0900  ?BP: 129/82  136/87 134/75  ?Pulse: (!) 110  (!) 112 (!) 107  ?Resp: (!) 24   (!) 24  ?Temp:      ?TempSrc:      ?SpO2: 97% 96% 100% 98%  ?Weight:    61.2 kg  ?Height:    5\' 4"  (1.626 m)  ? ?General: 62 y.o. male resting in bed in NAD ?Eyes: PERRL, normal sclera ?ENMT: Nares patent w/o discharge, orophaynx clear, dentition normal, ears w/o discharge/lesions/ulcers ?Neck: Supple, trachea midline ?Cardiovascular: tachy, +S1, S2, no m/g/r, equal pulses throughout ?Respiratory: decreased at  bases, on BiPap ?GI: BS+, ND, diffuse ab ttp, no masses noted, no organomegaly noted ?MSK: No e/c/c, bruising noted at base of back ?Neuro: A&O x 3, no focal deficits ?Psyc: Appropriate interaction and affect, calm/cooperative ? ?Data Reviewed: ? ?Na+  134 ?K+  3.3 ?Mg2+  1.1 ?Albumin  2.9 ?WBC  18.2 ?Hgb  10.0 ? ?CT chest/ab/pelvis: 1. Diffuse scattered nodular densities primarily throughout the left lung most consistent with pneumonia, likely atypical in etiology versus aspiration. Small left pleural effusion. 2. Large hiatal hernia containing the majority of the stomach. Mild dilatation of the lower esophagus with ingested content which may represent reflux or delayed clearance. This can predispose to aspiration. 3. Sigmoid diverticulosis with muscular hypertrophy. No bowel obstruction. No evidence of perforation. 4. Focal area of linear enhancement along the right lateral bladder wall may represent adherent debris, or an enhancing mass. Further ?evaluation with cystoscopy or dedicated multiphasic CT urography on a nonemergent/outpatient basis recommended. ?5. Solitary right kidney. No hydronephrosis. ?6. Aortic Atherosclerosis (ICD10-I70.0). ? ?Assessment and Plan: ?No notes have been filed under this hospital service. ?Service: Hospitalist ?Multifocal PNA ?Sepsis ?Acute respiratory failure w/ hypoxia ?    - admitted to inpt, SDU ?    - likely aspiration (recent c-scope, large hiatal hernia and esophageal dilation) ?    - currently on vanc, cefepime; continue for now ?    - COVID/flu negative ?    - currently on Bipap; wean as able ?    - nebs, IS, FV ?    - SLP swallow eval ? ?Abdominal pain ?Large stomach containing hiatal hernia ?    - LBGI and CCS consulted; appreciate assistance ?    - PRN pain med, fluids ?    - protonix IV BID ?    - compazine ?    - records request for c-scope info sent ? ?Hypokalemia ?Hypomagnesemia ?    - replace K+, Mg2+ ? ?HTN ?    - resume home regimen when confirmed ? ?Abnormal CT  finding ?    - Focal area of linear enhancement along the right lateral bladder wall may represent adherent debris, or an enhancing mass. Further evaluation with cystoscopy or dedicated multiphasic CT urography on a nonemergent/outpatient basis recommended. ? ?Anxiety ?     - resume home regimen when confirmed ? ? Advance Care Planning:   Code Status: FULL ? ?Consults: LBGI, CCS ? ?Family Communication: None at bedside ? ?Severity of Illness: ?The appropriate patient status for this patient is INPATIENT. Inpatient status is judged to be reasonable and necessary in order to provide the required intensity of service to ensure the patient's safety. The patient's presenting symptoms, physical exam findings, and initial radiographic and laboratory data in the context of their chronic comorbidities is felt to place them at high risk for further clinical deterioration. Furthermore, it is not anticipated that the patient  will be medically stable for discharge from the hospital within 2 midnights of admission.  ? ?* I certify that at the point of admission it is my clinical judgment that the patient will require inpatient hospital care spanning beyond 2 midnights from the point of admission due to high intensity of service, high risk for further deterioration and high frequency of surveillance required.* ? ?Author: ?Teddy Spikeyrone A Lunette Tapp, DO ?05/31/2021 10:30 AM ? ?For on call review [email or web address].  ?

## 2021-05-31 NOTE — ED Notes (Signed)
Patient remains on BiPAP at this time. Patient unable to tolerate long periods off of machine without becoming tachypneic. Patient became short of breath transferring from bed to bedside commode. Patient was returned to the bed and visibly dyspneic after using bedside commode. Patient complaining of pain during the encounter. Dr. Langston Masker made aware of pain. Patient bed ready at [place]; report called and given to [facility] RT. RT care continues.   ?

## 2021-05-31 NOTE — ED Notes (Signed)
Patient being transferred to [facility] ICU/Stepdown at this time with [facility]. Patient on BiPAP for transport.  ?

## 2021-05-31 NOTE — Progress Notes (Signed)
Patient unable to tolerate IV K+.  Potassium turned down to 25 mL/ hr.   Patient still unable to tolerate.  Notified MD.  Per MD, stop IV K+ until labs rechecked at 1800.  Depending on K+ level, may need to restart IV potassium if patient in unable to tolerate PO intake.  ?

## 2021-05-31 NOTE — Consult Note (Signed)
? ? ? ? ? Consultation ? ?Referring Provider:   Dr. Marylyn Ishihara ?Primary Care Physician:  Pcp, No ?Primary Gastroenterologist:  Unknown primary, had colon yesterday per patient in [place]       ?Reason for Consultation:   Suspected aspiration pneumonia large hiatal hernia ? ? Impression/Plan:  ? ? ?Acute respiratory failure with hypoxia ?Likely aspiration pneumonia possibly during procedure yesterday. ?Will try to get records ?On BiPAP at this point. ? ?Large hiatal hernia with reflux, history of Barrett's esophagus. ?We will need to get records. ?Patient most likely would benefit from evaluation with surgery outpatient once respiratory failure has resolved. ?Per history does not appear to have any choking during liquids, may want to consider MBS evaluation this visit or outpatient to help with diet. ?Do suggest IV Protonix twice daily, consider outpatient continuation until followed up with GI/surgery. ? ?Continue follow-up with primary GI, will sign off at this time.  ?Thank you for your kind consultation, please call us back if needed during this hospital stay.  ?       ? HPI:   ?Colin Hamilton is a 61 y.o. male with past medical history significant for asthma.  GERD.  Arthritis.  Sigmoid diverticulitis (10/2016, 08/2017).  Alcohol abuse.  Fatty liver on CT imaging.  Knee arthroscopy.  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  Atrial fibrillation not on anticoagulation. ? ?Very complicated patient with history severe esophagitis, GI bleeds and spontaneous esophageal rupture requiring surgical repair with postop complications involving the lung requiring VATS. ?Seen last by our inpatient team 06/2020 for nausea and vomiting.  ?Has acquired outpatient GI and per patient had colonoscopy yesterday with [facility] in [place], however this is not visible on the computer so I do wonder if this was not with some other GI group. ?Patient began to have shortness of breath and abdominal discomfort the day after the procedure. ?Patient found to  have diffuse scattered nodular densities through left lobe looks most consistent with pneumonia likely atypical in etiology versus aspiration.  Small left pleural effusion.   ?Can see known large hiatal hernia containing majority of the stomach, mild dilatation of the lower esophagus with ingested contents which may represent reflux or delayed clearance.  This can predispose to aspiration. ?Currently patient remains tachycardic, tachypneic, blood pressure study.  Patient requiring BiPAP. ?Patient had normal INR, hypokalemia, normal lfts, mild leukocytosis 13.2 pending today's CBC, no anemia.  ? 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 ?EGD 11/2016 for upper GI bleed requiring transfusion for blood loss anemia.  Showed severely ulcerated mucosa from the GE junction to 28 cm. ?EGD 01/2017, unable to access report. ?Colonoscopy 01/2017.  Severe diverticulosis, colovesicular and multiple colocolonic fistula, colitis.  That time he received treatment for C. difficile toxin and C. difficile antigen positive stool.   ?EGD 06/2017.  For hematemesis.  Grade 4 Pan esophagitis, nodular GEJ, squamocolumnar junction appeared irregular..  Biopsies obtained. ?EGD 08/2017 for GI bleed, blood loss anemia.  Severe circumferential ulcerative esophagitis, no active bleeding, no visible vessel.  Hiatal hernia.  Coffee-ground material limited stomach views but no active bleeding.  Normal visualized duodenum. ?EGD 08/2017 for recurrent upper GI bleed.  Grade 3 esophagitis in distal esophagus.  Irregular appearing square MR columnar junction, not biopsied.  Medium sized HH.  Normal-appearing stomach and duodenal mucosa. 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 ?Right VATS 10/2017 ?Tracheostomy 11/2017 ?Bronchoscopy 12/2017. ?EGD and fiberoptic bronchoscopy 03/2018 ?EGD with esophageal dilatation 12/2017. ?EGD with esophageal  dilatation 03/2017 x 2.,  05/2018, 07/2018, 08/2018 x  3 ?Right VATS, LOA,  esophageal drainage 08/2018 ?Open esophagectomy, repair hiatal hernia diagnostic lap, gastric pull-through 09/2018 at [facility] ?[place], debridement of right lateral thoractomy 09/2018 ?EGD 08/2019 ?EGD 01/2020.  Barrett's esophagus, esophagitis. ? ? ?Past Medical History:  ?Diagnosis Date  ?? Asthma   ?? Asthma in adult, mild intermittent, uncomplicated 0000000  ?? Essential hypertension 06/05/2020  ?? GERD with esophagitis 06/05/2020  ?? History of alcohol abuse 06/05/2020  ? ? ?Surgical History:  ?He  has no past surgical history on file. ?Family History:  ?His family history includes Cancer in his sister. ?Social History:  ? reports that he has quit smoking. He has never used smokeless tobacco. He reports current alcohol use. He reports current drug use. Drug: Marijuana. ? ?Prior to Admission medications   ?Medication Sig Start Date End Date Taking? Authorizing Provider  ?albuterol (VENTOLIN HFA) 108 (90 Base) MCG/ACT inhaler Inhale 2 puffs into the lungs every 6 (six) hours as needed for wheezing or shortness of breath.    [provider]  ?cyclobenzaprine (FLEXERIL) 10 MG tablet Take 1 tablet (10 mg total) by mouth 3 (three) times daily as needed for muscle spasms. 07/28/20   Oswald Hillock, MD  ?docusate sodium (COLACE) 100 MG capsule Take 100 mg by mouth daily.    [provider]  ?ferrous gluconate (FERGON) 324 MG tablet Take 1 tablet (324 mg total) by mouth daily with breakfast. 07/29/20   Oswald Hillock, MD  ?Fluticasone-Umeclidin-Vilant (TRELEGY ELLIPTA) 100-62.5-25 MCG/INH AEPB Inhale 1 puff into the lungs daily.    [provider]  ?HYDROcodone-acetaminophen (NORCO/VICODIN) 5-325 MG tablet Take 1 tablet by mouth every 6 (six) hours as needed for moderate pain. 07/28/20   Oswald Hillock, MD  ?metoCLOPramide (REGLAN) 5 MG tablet Take 1 tablet (5 mg total) by mouth every 8 (eight) hours as needed for nausea. ?Patient taking differently: Take 5 mg by mouth 2  (two) times daily before a meal. 06/09/20 07/09/20  Ghimire, Henreitta Leber, MD  ?metoprolol tartrate (LOPRESSOR) 25 MG tablet Take 0.5 tablets (12.5 mg total) by mouth 2 (two) times daily. 07/28/20   Oswald Hillock, MD  ?ondansetron (ZOFRAN) 8 MG tablet Take 8 mg by mouth every 8 (eight) hours as needed for nausea or vomiting.    [provider]  ?pantoprazole (PROTONIX) 40 MG tablet Take 40 mg by mouth daily.    [provider]  ?QUEtiapine (SEROQUEL) 50 MG tablet Take 50 mg by mouth at bedtime. ?Patient not taking: No sig reported    [provider]  ?sucralfate (CARAFATE) 1 g tablet Take 1 g by mouth 2 (two) times daily before a meal.    [provider]  ? ? ?Current Facility-Administered Medications  ?Medication Dose Route Frequency Provider Last Rate Last Admin  ?? ceFEPIme (MAXIPIME) 2 g in sodium chloride 0.9 % 100 mL IVPB  2 g Intravenous Q12H Bertis Ruddy, [facility]   Stopped at 05/31/21 1012  ?? chlorhexidine (PERIDEX) 0.12 % solution 15 mL  15 mL Mouth Rinse BID Rise Patience, MD   15 mL at 05/31/21 1017  ?? Chlorhexidine Gluconate Cloth 2 % PADS 6 each  6 each Topical Daily Rise Patience, MD   6 each at 05/31/21 [phone]  ?? lactated ringers infusion   Intravenous Continuous Davonna Belling, MD   Stopped at 05/31/21 0805  ?? MEDLINE mouth rinse  15 mL Mouth Rinse q12n4p  Rise Patience, MD      ?? potassium chloride 10 mEq in 100 mL IVPB  10 mEq Intravenous Q1 Hr x 2 Wyvonnia Dusky, MD 100 mL/hr at 05/31/21 1014 10 mEq at 05/31/21 1014  ?? vancomycin (VANCOREADY) IVPB 500 mg/100 mL  500 mg Intravenous Q12H [place], Abby K, RPH 100 mL/hr at 05/31/21 1013 500 mg at 05/31/21 1013  ? ? ?Allergies as of 05/29/2021 - Review Complete 05/31/2021  ?Allergen Reaction Noted  ?? Penicillins Swelling 06/05/2020  ? ? ?Review of Systems:    ?Constitutional: No weight loss, fever, chills, weakness or fatigue ?HEENT: Eyes: No change in vision ?              Ears, Nose, Throat:   No change in hearing or congestion ?Skin: No rash or itching ?Cardiovascular: No chest pain, chest pressure or palpitations   ?Respiratory: No SOB or cough ?Gastrointestinal: See HPI and otherwise negative ?

## 2021-06-01 ENCOUNTER — Inpatient Hospital Stay (HOSPITAL_COMMUNITY): Payer: Medicare (Managed Care)

## 2021-06-01 DIAGNOSIS — R0603 Acute respiratory distress: Secondary | ICD-10-CM

## 2021-06-01 DIAGNOSIS — J45901 Unspecified asthma with (acute) exacerbation: Secondary | ICD-10-CM

## 2021-06-01 DIAGNOSIS — J690 Pneumonitis due to inhalation of food and vomit: Secondary | ICD-10-CM | POA: Diagnosis not present

## 2021-06-01 DIAGNOSIS — F1010 Alcohol abuse, uncomplicated: Secondary | ICD-10-CM

## 2021-06-01 DIAGNOSIS — E878 Other disorders of electrolyte and fluid balance, not elsewhere classified: Secondary | ICD-10-CM

## 2021-06-01 DIAGNOSIS — N1831 Chronic kidney disease, stage 3a: Secondary | ICD-10-CM

## 2021-06-01 DIAGNOSIS — A419 Sepsis, unspecified organism: Secondary | ICD-10-CM | POA: Diagnosis not present

## 2021-06-01 DIAGNOSIS — E876 Hypokalemia: Secondary | ICD-10-CM

## 2021-06-01 DIAGNOSIS — E43 Unspecified severe protein-calorie malnutrition: Secondary | ICD-10-CM

## 2021-06-01 DIAGNOSIS — R109 Unspecified abdominal pain: Secondary | ICD-10-CM | POA: Diagnosis not present

## 2021-06-01 DIAGNOSIS — R935 Abnormal findings on diagnostic imaging of other abdominal regions, including retroperitoneum: Secondary | ICD-10-CM

## 2021-06-01 DIAGNOSIS — I10 Essential (primary) hypertension: Secondary | ICD-10-CM

## 2021-06-01 DIAGNOSIS — R000 Tachycardia, unspecified: Secondary | ICD-10-CM

## 2021-06-01 DIAGNOSIS — E871 Hypo-osmolality and hyponatremia: Secondary | ICD-10-CM

## 2021-06-01 DIAGNOSIS — F419 Anxiety disorder, unspecified: Secondary | ICD-10-CM

## 2021-06-01 DIAGNOSIS — J4521 Mild intermittent asthma with (acute) exacerbation: Secondary | ICD-10-CM

## 2021-06-01 DIAGNOSIS — E872 Acidosis, unspecified: Secondary | ICD-10-CM

## 2021-06-01 DIAGNOSIS — J4520 Mild intermittent asthma, uncomplicated: Secondary | ICD-10-CM

## 2021-06-01 LAB — RAPID URINE DRUG SCREEN, HOSP PERFORMED
Amphetamines: NOT DETECTED
Barbiturates: NOT DETECTED
Benzodiazepines: NOT DETECTED
Cocaine: NOT DETECTED
Opiates: POSITIVE — AB
Tetrahydrocannabinol: NOT DETECTED

## 2021-06-01 LAB — URINE CULTURE: Culture: <10000 — AB

## 2021-06-01 LAB — BLOOD GAS, VENOUS
Acid-Base Excess: 5.9 mmol/L — ABNORMAL HIGH (ref 0.0–2.0)
Bicarbonate: 32.1 mmol/L — ABNORMAL HIGH (ref 20.0–28.0)
O2 Saturation: 46.8 %
Patient temperature: 37.1
pCO2, Ven: 53 mmHg (ref 44–60)
pH, Ven: 7.39 (ref 7.25–7.43)
pO2, Ven: 31 mmHg — CL (ref 32–45)

## 2021-06-01 LAB — BASIC METABOLIC PANEL
Anion gap: 8 (ref 5–15)
Anion gap: 12 (ref 5–15)
BUN: 11 mg/dL (ref 6–20)
BUN: 11 mg/dL (ref 6–20)
CO2: 25 mmol/L (ref 22–32)
CO2: 26 mmol/L (ref 22–32)
Calcium: 7.9 mg/dL — ABNORMAL LOW (ref 8.9–10.3)
Calcium: 8.4 mg/dL — ABNORMAL LOW (ref 8.9–10.3)
Chloride: 101 mmol/L (ref 98–111)
Chloride: 97 mmol/L — ABNORMAL LOW (ref 98–111)
Creatinine, Ser: 1.28 mg/dL — ABNORMAL HIGH (ref 0.61–1.24)
Creatinine, Ser: 1.37 mg/dL — ABNORMAL HIGH (ref 0.61–1.24)
GFR, Estimated: >60 mL/min (ref >60)
GFR, Estimated: 59 mL/min — ABNORMAL LOW (ref >60)
Glucose, Bld: 117 mg/dL — ABNORMAL HIGH (ref 70–99)
Glucose, Bld: 87 mg/dL (ref 70–99)
Potassium: 3.6 mmol/L (ref 3.5–5.1)
Potassium: 3.7 mmol/L (ref 3.5–5.1)
Sodium: 134 mmol/L — ABNORMAL LOW (ref 135–145)
Sodium: 135 mmol/L (ref 135–145)

## 2021-06-01 LAB — CBC WITH DIFFERENTIAL/PLATELET
Abs Immature Granulocytes: 0.55 10*3/uL — ABNORMAL HIGH (ref 0.00–0.07)
Basophils Absolute: 0.1 10*3/uL (ref 0.0–0.1)
Basophils Relative: 0 %
Eosinophils Absolute: 0 10*3/uL (ref 0.0–0.5)
Eosinophils Relative: 0 %
HCT: 37.2 % — ABNORMAL LOW (ref 39.0–52.0)
Hemoglobin: 11.7 g/dL — ABNORMAL LOW (ref 13.0–17.0)
Immature Granulocytes: 3 %
Lymphocytes Relative: 4 %
Lymphs Abs: 0.8 10*3/uL (ref 0.7–4.0)
MCH: 28.4 pg (ref 26.0–34.0)
MCHC: 31.5 g/dL (ref 30.0–36.0)
MCV: 90.3 fL (ref 80.0–100.0)
Monocytes Absolute: 1 10*3/uL (ref 0.1–1.0)
Monocytes Relative: 4 %
Neutro Abs: 19.5 10*3/uL — ABNORMAL HIGH (ref 1.7–7.7)
Neutrophils Relative %: 89 %
Platelets: UNDETERMINED 10*3/uL (ref 150–400)
RBC: 4.12 MIL/uL — ABNORMAL LOW (ref 4.22–5.81)
RDW: 17.4 % — ABNORMAL HIGH (ref 11.5–15.5)
WBC: 21.9 10*3/uL — ABNORMAL HIGH (ref 4.0–10.5)
nRBC: 0 % (ref 0.0–0.2)

## 2021-06-01 LAB — CBC
HCT: 30.6 % — ABNORMAL LOW (ref 39.0–52.0)
Hemoglobin: 9.8 g/dL — ABNORMAL LOW (ref 13.0–17.0)
MCH: 28.7 pg (ref 26.0–34.0)
MCHC: 32 g/dL (ref 30.0–36.0)
MCV: 89.7 fL (ref 80.0–100.0)
Platelets: 129 10*3/uL — ABNORMAL LOW (ref 150–400)
RBC: 3.41 MIL/uL — ABNORMAL LOW (ref 4.22–5.81)
RDW: 17.4 % — ABNORMAL HIGH (ref 11.5–15.5)
WBC: 12.6 10*3/uL — ABNORMAL HIGH (ref 4.0–10.5)
nRBC: 0 % (ref 0.0–0.2)

## 2021-06-01 LAB — LACTIC ACID, PLASMA
Lactic Acid, Venous: 2 mmol/L (ref 0.5–1.9)
Lactic Acid, Venous: 3 mmol/L (ref 0.5–1.9)
Lactic Acid, Venous: 2.9 mmol/L (ref 0.5–1.9)
Lactic Acid, Venous: 3.1 mmol/L (ref 0.5–1.9)

## 2021-06-01 LAB — BRAIN NATRIURETIC PEPTIDE: B Natriuretic Peptide: 662.7 pg/mL — ABNORMAL HIGH (ref 0.0–100.0)

## 2021-06-01 LAB — MAGNESIUM: Magnesium: 1.7 mg/dL (ref 1.7–2.4)

## 2021-06-01 LAB — PROCALCITONIN: Procalcitonin: 6.12 ng/mL

## 2021-06-01 IMAGING — DX DG CHEST 1V PORT
1 series · 1 of 1 positions shown · non-contrast
Comparison: [DATE]

CLINICAL DATA: Dyspnea

EXAM:
PORTABLE CHEST 1 VIEW

[chest ap]
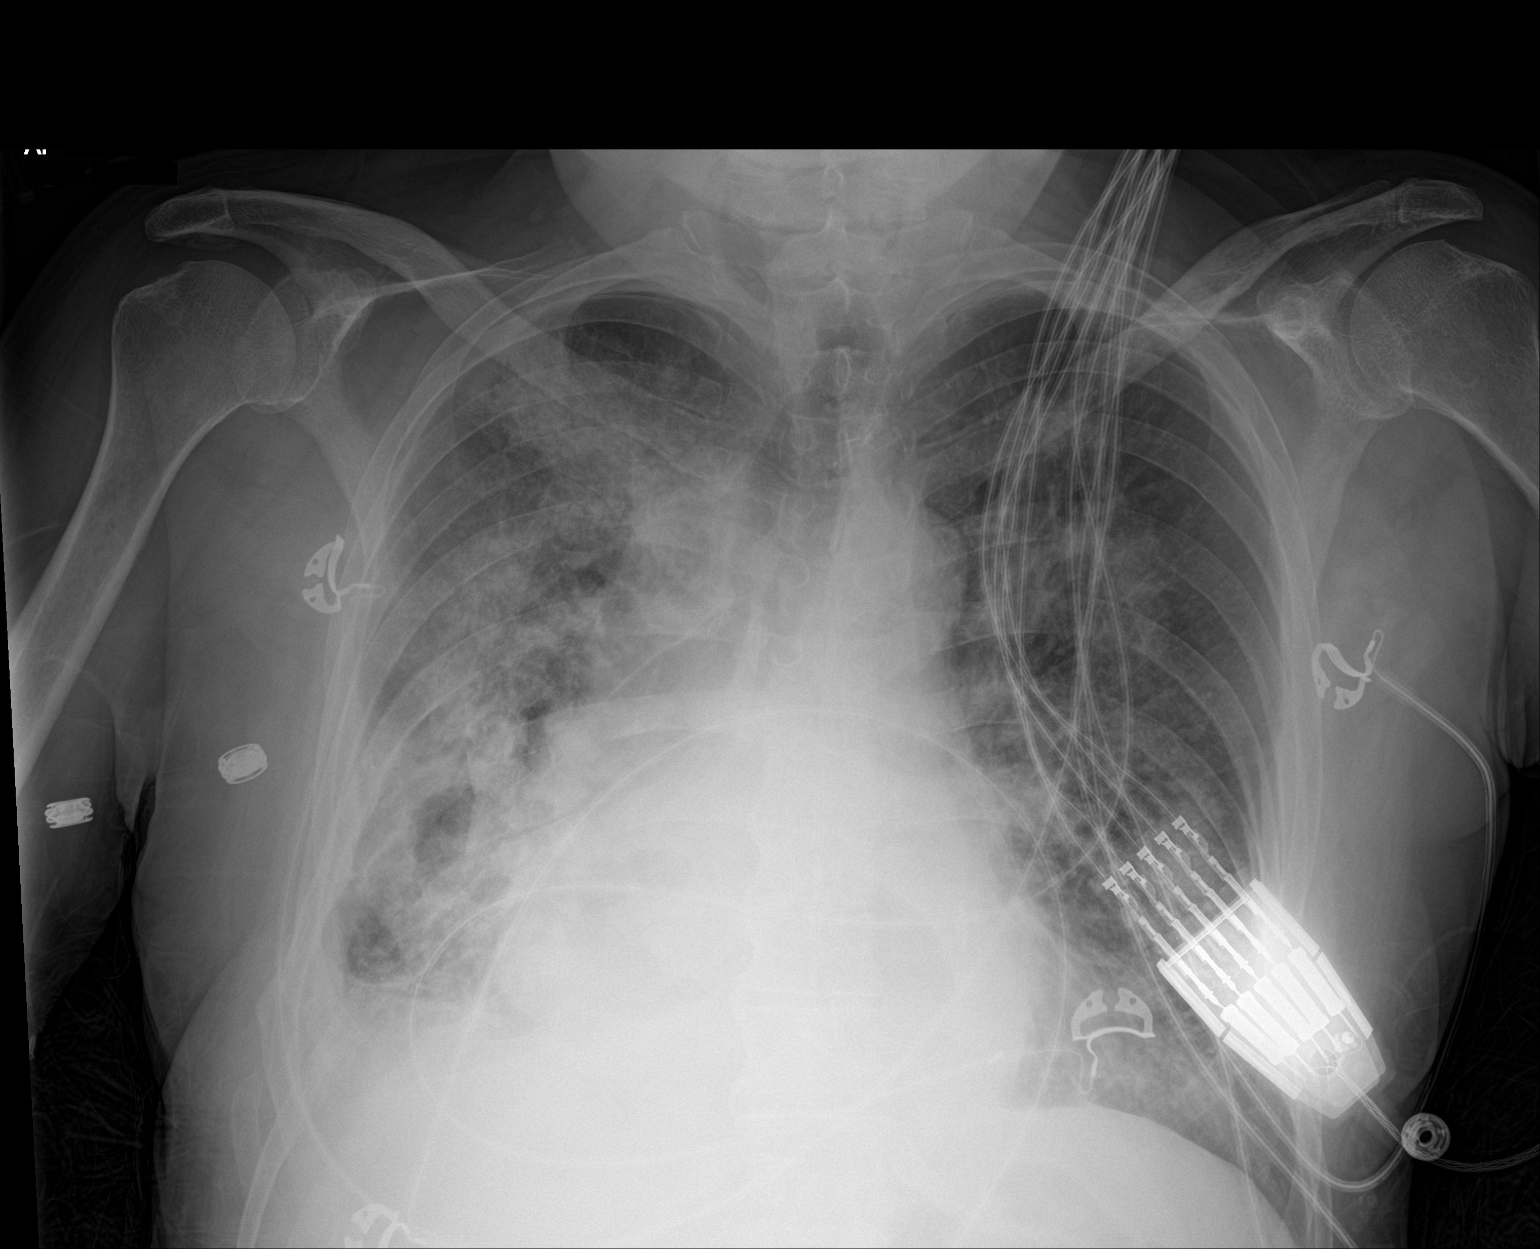

[1 of 1 positions shown; findings below may reference images not displayed]

FINDINGS: [1U] hours. Interval progression of airspace disease diffusely in
the right lung and left base. Right pleural effusion again noted.
The cardio pericardial silhouette is enlarged. Large hiatal hernia
again noted. Telemetry leads overlie the chest.
IMPRESSION: Interval progression of asymmetric airspace disease in the right
lung and left base. Imaging features suspicious for diffuse
infection.

## 2021-06-01 MED ORDER — LEVALBUTEROL HCL 0.63 MG/3ML IN NEBU
0.6300 mg | INHALATION_SOLUTION | Freq: Four times a day (QID) | RESPIRATORY_TRACT | Status: DC
  Administered 2021-06-01 – 2021-06-13 (×47): 0.63 mg via RESPIRATORY_TRACT
  Filled 2021-06-01 (×46): qty 3

## 2021-06-01 MED ORDER — LACTATED RINGERS IV BOLUS
500.0000 mL | Freq: Once | INTRAVENOUS | Status: AC
  Administered 2021-06-01: 500 mL via INTRAVENOUS

## 2021-06-01 MED ORDER — FOLIC ACID 1 MG PO TABS
1.0000 mg | ORAL_TABLET | Freq: Every day | ORAL | Status: DC
  Filled 2021-06-01 (×2): qty 1

## 2021-06-01 MED ORDER — LORAZEPAM 1 MG PO TABS: 1.0000 mg | ORAL_TABLET | ORAL | Status: DC | PRN

## 2021-06-01 MED ORDER — FUROSEMIDE 10 MG/ML IJ SOLN
40.0000 mg | Freq: Once | INTRAMUSCULAR | Status: AC
  Administered 2021-06-01: 40 mg via INTRAVENOUS
  Filled 2021-06-01: qty 4

## 2021-06-01 MED ORDER — SODIUM CHLORIDE 0.9 % IV SOLN
500.0000 mg | INTRAVENOUS | Status: AC
  Administered 2021-06-01 – 2021-06-03 (×3): 500 mg via INTRAVENOUS
  Filled 2021-06-01 (×3): qty 5

## 2021-06-01 MED ORDER — THIAMINE HCL 100 MG PO TABS: 100.0000 mg | ORAL_TABLET | Freq: Every day | ORAL | Status: DC

## 2021-06-01 MED ORDER — METOPROLOL TARTRATE 5 MG/5ML IV SOLN
5.0000 mg | Freq: Four times a day (QID) | INTRAVENOUS | Status: DC
Start: 2021-06-01 — End: 2021-06-02
  Administered 2021-06-01 – 2021-06-02 (×3): 5 mg via INTRAVENOUS
  Filled 2021-06-01 (×4): qty 5

## 2021-06-01 MED ORDER — DILTIAZEM HCL 30 MG PO TABS: 30.0000 mg | ORAL_TABLET | Freq: Four times a day (QID) | ORAL | Status: DC | PRN

## 2021-06-01 MED ORDER — IPRATROPIUM BROMIDE 0.02 % IN SOLN
0.5000 mg | RESPIRATORY_TRACT | Status: DC | PRN
  Administered 2021-06-01: 0.5 mg via RESPIRATORY_TRACT

## 2021-06-01 MED ORDER — HYDROMORPHONE HCL 1 MG/ML IJ SOLN
0.5000 mg | INTRAMUSCULAR | Status: DC | PRN
  Administered 2021-06-02: 0.5 mg via INTRAVENOUS
  Filled 2021-06-01: qty 1

## 2021-06-01 MED ORDER — LEVALBUTEROL HCL 0.63 MG/3ML IN NEBU
0.6300 mg | INHALATION_SOLUTION | RESPIRATORY_TRACT | Status: DC | PRN
  Administered 2021-06-01: 0.63 mg via RESPIRATORY_TRACT
  Filled 2021-06-01: qty 3

## 2021-06-01 MED ORDER — IPRATROPIUM BROMIDE 0.02 % IN SOLN
0.5000 mg | Freq: Four times a day (QID) | RESPIRATORY_TRACT | Status: DC
  Administered 2021-06-01 – 2021-06-13 (×47): 0.5 mg via RESPIRATORY_TRACT
  Filled 2021-06-01 (×48): qty 2.5

## 2021-06-01 MED ORDER — ADULT MULTIVITAMIN W/MINERALS CH
1.0000 | ORAL_TABLET | Freq: Every day | ORAL | Status: DC
  Filled 2021-06-01 (×2): qty 1

## 2021-06-01 MED ORDER — DILTIAZEM HCL 25 MG/5ML IV SOLN
10.0000 mg | Freq: Once | INTRAVENOUS | Status: AC
  Administered 2021-06-01: 10 mg via INTRAVENOUS
  Filled 2021-06-01: qty 5

## 2021-06-01 MED ORDER — LACTATED RINGERS IV BOLUS: 500.0000 mL | Freq: Once | INTRAVENOUS | Status: DC

## 2021-06-01 MED ORDER — LORAZEPAM 2 MG/ML IJ SOLN
1.0000 mg | INTRAMUSCULAR | Status: DC | PRN
  Administered 2021-06-01: 2 mg via INTRAVENOUS
  Administered 2021-06-01: 1 mg via INTRAVENOUS
  Administered 2021-06-01 – 2021-06-02 (×3): 2 mg via INTRAVENOUS
  Filled 2021-06-01 (×5): qty 1

## 2021-06-01 MED ORDER — SODIUM CHLORIDE 0.9 % IV SOLN
2.0000 g | INTRAVENOUS | Status: DC
  Administered 2021-06-01: 2 g via INTRAVENOUS
  Filled 2021-06-01: qty 20

## 2021-06-01 MED ORDER — LORAZEPAM 0.5 MG PO TABS: 0.5000 mg | ORAL_TABLET | Freq: Three times a day (TID) | ORAL | Status: DC | PRN

## 2021-06-01 MED ORDER — METRONIDAZOLE 500 MG/100ML IV SOLN
500.0000 mg | Freq: Two times a day (BID) | INTRAVENOUS | Status: DC
  Administered 2021-06-01 – 2021-06-03 (×5): 500 mg via INTRAVENOUS
  Filled 2021-06-01 (×5): qty 100

## 2021-06-01 MED ORDER — THIAMINE HCL 100 MG/ML IJ SOLN
100.0000 mg | Freq: Every day | INTRAMUSCULAR | Status: DC
  Administered 2021-06-01 – 2021-06-05 (×5): 100 mg via INTRAVENOUS
  Filled 2021-06-01 (×5): qty 2

## 2021-06-01 NOTE — Assessment & Plan Note (Signed)
-  See severe sepsis 

## 2021-06-01 NOTE — Assessment & Plan Note (Addendum)
Likely due to aspiration pneumonia/pneumonitis, anxiety and possible alcohol withdrawal.  Doubt asthma exacerbation.  ABG suggest this respiratory alkalosis.  Imaging concerning for multifocal pneumonia likely from aspiration versus CHF.  CT chest also suggestive of atypical pneumonia. ?-Wean oxygen as able. ?-BiPAP as needed for work of breathing ?-See severe sepsis for management ?

## 2021-06-01 NOTE — Progress Notes (Signed)
Patient had gotten up to the restroom and had marked SOB. Breathing given and placed on BIPAP ?

## 2021-06-01 NOTE — Progress Notes (Signed)
Patient started to vomit while on BIPAP , he was then changed to a 6L [place]. He is currently tolerating well at this time with no distress. RT will continue to monitor ?

## 2021-06-01 NOTE — Assessment & Plan Note (Addendum)
Ativan as needed 

## 2021-06-01 NOTE — TOC Initial Note (Signed)
Transition of Care (TOC) - Initial/Assessment Note  ? ? ?Patient Details  ?Name: Samyak Sackmann ?MRN: [id number] ?Date of Birth: February 17, 1961 ? ?Transition of Care (TOC) CM/SW Contact:    ?Cecille Po, RN ?Phone Number: ?06/01/2021, 2:16 PM ? ?Clinical Narrative:                 ? ?Patient from home, has roommates per notes.  TOC following.  Will need substance abuse resources offered once more stable.  ? ?Expected Discharge Plan: Home/Self Care ?Barriers to Discharge: Continued Medical Work up ? ? ?Expected Discharge Plan and Services ?Expected Discharge Plan: Home/Self Care ?  ?  ?  ?Living arrangements for the past 2 months: Single Family Home ?                ? Prior Living Arrangements/Services ?Living arrangements for the past 2 months: Single Family Home ?Lives with:: Friends ?Patient language and need for interpreter reviewed:: Yes ?       ?Need for Family Participation in Patient Care: No (Comment) ?Care giver support system in place?: No (comment) ?  ?Criminal Activity/Legal Involvement Pertinent to Current Situation/Hospitalization: No - Comment as needed ? ?Activities of Daily Living ?Home Assistive Devices/Equipment: CBG Meter ?ADL Screening (condition at time of admission) ?Patient's cognitive ability adequate to safely complete daily activities?: Yes ?Is the patient deaf or have difficulty hearing?: No ?Does the patient have difficulty seeing, even when wearing glasses/contacts?: No ?Does the patient have difficulty concentrating, remembering, or making decisions?: No ?Patient able to express need for assistance with ADLs?: Yes ?Does the patient have difficulty dressing or bathing?: No ?Independently performs ADLs?: Yes (appropriate for developmental age) ?Does the patient have difficulty walking or climbing stairs?: Yes ?Weakness of Legs: Both ?Weakness of Arms/Hands: Both ? ? ?  ?Alcohol / Substance Use: Other (comment), Alcohol Use (Marijuana) ?Psych Involvement: No (comment) ? ?Admission  diagnosis:  Acute respiratory failure with hypoxia (HCC) [J96.01] ?Acute respiratory failure (HCC) [J96.00] ?Patient Active Problem List  ? Diagnosis Date Noted  ? Aspiration pneumonia (HCC) 06/01/2021  ? Alcohol abuse 06/01/2021  ? 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 Abnormal CT of the abdomen 06/01/2021  ? Lactic acidosis 06/01/2021  ? Hyponatremia 06/01/2021  ? Hyponatremia, hypokalemia and hypomagnesemia 06/01/2021  ? Respiratory distress 06/01/2021  ? Acute respiratory failure (HCC) 05/31/2021  ? Anxiety 05/31/2021  ? Hypomagnesemia 05/31/2021  ? Protein-calorie malnutrition, severe 07/23/2020  ? Irregular cardiac rhythm 07/21/2020  ? Mild renal insufficiency 07/21/2020  ? Severe sepsis due to multifocal pneumonia 07/21/2020  ? Abdominal pain   ? Sepsis secondary to UTI (HCC) 06/05/2020  ? Hypokalemia 06/05/2020  ? Sinus tachycardia 06/05/2020  ? GERD with esophagitis 06/05/2020  ? History of alcohol abuse 06/05/2020  ? Essential hypertension 06/05/2020  ? Intractable nausea and vomiting 06/05/2020  ? Asthma in adult, mild intermittent, uncomplicated 06/05/2020  ? Sigmoid diverticulitis 06/05/2020  ? ?PCP:  Pcp, No ?Pharmacy:   ?[facility] [address] [place] - [address] ?[address] ?[place] [address] ?Phone: [phone] Fax: [phone] ? ? ? ?Readmission Risk Interventions ? ?  06/01/2021  ?  2:15 PM  ?Readmission Risk Prevention Plan  ?Transportation Screening Complete  ?PCP or Specialist Appt within 3-5 Days Complete  ?HRI or Home Care Consult Complete  ?Social Work Consult for Recovery Care Planning/Counseling Complete  ?Palliative Care Screening Not Applicable  ?Medication Review Oceanographer) Complete  ? ? ? ?

## 2021-06-01 NOTE — Assessment & Plan Note (Addendum)
CT A/P on 3/21 with large hiatal hernia, persistent proximal mid sigmoid colon bowel wall thickening raising concern for infiltrative mass and diffuse sigmoid diverticulosis without diverticulitis.  CT A/P on 3/30 showed pneumonia, known large hiatal hernia and possible debris's or mass in bladder.  Had a colonoscopy with digestive GI on 3/30.  Reports persistent abdominal pain.  Has diffuse tenderness.  ?-GI signed off. ?-Obtain reports of colonoscopy from digestive GI ?-General surgery following ?-Repeat CT C/A/P given persistent severe sepsis physiology ?

## 2021-06-01 NOTE — Assessment & Plan Note (Addendum)
Hyponatremia stable.  K3.3.  Mg 1.6. ?-IV D5-NS-KCl 20 at 125 cc an hour ?-IV KCl 10x4 ?-IV magnesium sulfate 2 g x 1 ?-Continue monitoring ?

## 2021-06-01 NOTE — Assessment & Plan Note (Deleted)
Resolved

## 2021-06-01 NOTE — Assessment & Plan Note (Addendum)
Stable.  ?-IV fluid ?

## 2021-06-01 NOTE — Assessment & Plan Note (Addendum)
CT chest with diffuse scattered nodular densities mainly throughout the left lung most consistent with atypical pneumonia versus aspiration.  However, patient is at risk for aspiration pneumonia given large hiatal hernia and recent colonoscopy. CXR with interval progression of asymmetric airspace disease in the right lung the left base.  Blood cultures NGTD.  MRSA PCR negative.  Still tachycardic and tachypneic with significant oxygen requirement.  Also some degree of encephalopathy.  Leukocytosis and Pro-Cal uptrending. ?-Continue IV Flagyl and azithromycin ?-Change ceftriaxone to cefepime ?-Wean oxygen as able.  BiPAP intermittently ?-Resume IV fluid with D5 NS-KCl at 125 cc an hour.  Significant insensible loss from tachycardia pi?a. ?-Discontinue CIWA monitoring.  Denies EtOH use in 3 weeks. ?-Trend lactic acid-improved. ?-Repeat CT chest/abdomen/pelvis ?-Aspiration precaution and SLP ?-Clear liquid diet ?

## 2021-06-01 NOTE — Assessment & Plan Note (Addendum)
Normotensive for most part. ?-IV metoprolol 5 mg every 6 hours as needed ?-IV fluid ?

## 2021-06-01 NOTE — Progress Notes (Addendum)
? ?      REMOTE CROSS COVER NOTE ? ?NAME: Colin Hamilton ?MRN: [id number] ?DOB : 1960/11/27 ? ?Critical Lactic acid 3.0 reported.  ? ?61 yo M with PMH HTN, asthma, anxiety, Barrett's esophagus, esophagectomy w gastric pull through. Presented to the ED after a colonoscopy with abdominal pain and shortness of breath. He is currently being treated for multifocal PNA on Vancomycin and cefepime. ? ?536mL bolus given for lactic acidosis. Last CXR showed possible mild pulmonary edema. Continue Vancomycin and cefepime. Continue BiPAP as needed.  ? ? 0600: Patient reporting dyspnea, per nursing patient is not fluid volume overloaded. No edema, crackles, or rhonchi. CXR obtained to evaluate for worsening of prior mild pulmonary edmea.  ? ?0645: CXR shows interval worsening of multifocal pneumonia. Zithromax, flagyl, and ceftriaxone just ordered by rounding physician. Will continue to follow blood and urine cultures and trend lactic acid. ? ? ?Neomia Glass MHA, MSN, FNP-BC ?Nurse Practitioner ?[facility] ?[place] ?Pager [phone] ? ?

## 2021-06-01 NOTE — Assessment & Plan Note (Signed)
Noted focal area of linear enhancement along the right lateral bladder wall which may represent debris's or enhancing mass. ?-Further evaluation with cystoscopy or dedicated multiphase CT urography on nonemergent/outpatient basis recommended. ?

## 2021-06-01 NOTE — Progress Notes (Addendum)
Pt complained of SOB and chest tightness at 0527. SP02 remained 100%. PRN morphine given and BIPAP placed on the pt. [facility] notified. Orders received.  ?

## 2021-06-01 NOTE — Progress Notes (Addendum)
?PROGRESS NOTE ? ?Colin Hamilton [id number] DOB: May 20, 1960  ? ?PCP: Pcp, No ? ?Patient is from: Home.  Lives with a friend. ? ?DOA: 05/02/2021 LOS: 1 ? ?Chief complaints ?Chief Complaint  ?Patient presents with  ? Shortness of Breath  ?  ? ?Brief Narrative / Interim history: ?[name] with PMH of Barrett esophagus, esophagectomy with gastric pull-through, CKD-3A, solitary right kidney, HTN, asthma, anxiety and alcohol use disorder presenting with shortness of breath and abdominal pain, and admitted for severe sepsis due to possible aspiration pneumonia/pneumonitis and abdominal pain.  Patient had colonoscopy with digestive GI in [place] the day prior to presentation.  It seems that the patient was dealing with issues postprocedure per note.   ?On presentation, he was very tachycardic and tachypneic.  No documented desaturation but started on BiPAP due to work of breathing/dyspnea.  He had a leukocytosis and lactic acidosis.  CT C/A/P concerning for multifocal pneumonia, large hiatal hernia and focal area of linear enhancement along the right lateral bladder wall.  Cultures obtained.  Started on broad-spectrum antibiotics with vancomycin and cefepime, and admitted. ? ?Blood cultures NGTD.  MRSA PCR negative.  CXR with interval progression of asymmetric airspace disease in the right lung and left base.  Antibiotics de-escalated to CTX, Flagyl and azithromycin.  IV fluid discontinued. ?  ? ?Subjective: ?Seen and examined earlier this morning.  Patient had an episode of respiratory distress.  Chest x-ray with worsening infiltrate.  Remains on BiPAP.  He complains chest pain and abdominal pain.  Describes the pain as sharp.  Chest pain is across anterior chest.  Chest pain is intermittent.  Not sure about provoking or elevating factor.  Abdominal pain is constant.  Chest pain seems to be reproducible. ? ?Objective: ?Vitals:  ? 06/01/21 0845 06/01/21 0949 06/01/21 0954 06/01/21 1009  ?BP:      ?Pulse:   (!)  152 (!) 147  ?Resp:   (!) 32 (!) 34  ?Temp:      ?TempSrc:      ?SpO2: 99% 97% 98% 96%  ?Weight:      ?Height:      ? ? ?Examination: ? ?GENERAL: Frail.  Nontoxic. ?HEENT: MMM.  Vision and hearing grossly intact.  ?NECK: Supple.  No apparent JVD.  ?RESP: On BiPAP.  No IWOB.  Fair aeration bilaterally. ?CVS: Tachycardic to 130s.Marland Kitchen Heart sounds normal.  ?ABD/GI/GU: BS+. Abd soft, NTND.  ?MSK/EXT:  Moves extremities.  Significant muscle mass and subcu fat loss. ?SKIN: no apparent skin lesion or wound ?NEURO: Awake, alert and oriented appropriately.  No apparent focal neuro deficit. ?PSYCH: Calm. Normal affect.  ? ?Procedures:  ?None ? ?Microbiology summarized: ?COVID-19 and influenza PCR nonreactive. ?Blood cultures NGTD. ?MRSA PCR negative. ? ?Assessment and Plan: ?* Severe sepsis due to multifocal pneumonia ?Concern for aspiration pneumonia given large hiatal hernia, recent colonoscopy and alcohol use disorder.  CXR with interval progression of asymmetric airspace disease in the right lung the left base.  Blood cultures NGTD.  MRSA PCR negative.  Seems to have fair aeration bilaterally on BiPAP.  ?-De-escalate antibiotic to ceftriaxone, Flagyl and azithromycin ?-Wean off BiPAP as able ?-Discontinue IV fluid ?-CIWA monitoring with as needed Ativan ?-Check procalcitonin, BNP ?-Trend lactic acid ?-Aspiration precaution and SLP ?-Clear liquid diet pending SLP eval ? ?Respiratory distress ?Likely due to aspiration pneumonia/pneumonitis, anxiety and possible alcohol withdrawal.  Doubt asthma exacerbation.  ABG suggest this respiratory alkalosis.  Imaging concerning for multifocal pneumonia likely from aspiration versus CHF. ?-BiPAP as needed ?-  See severe sepsis for management ?-Check urine drug screen ? ?Aspiration pneumonia ([place]) ?See severe sepsis ? ?Abdominal pain ?No acute finding on CT abdomen and pelvis abdomen with a large atrial hernia to explain this.  Has mild lactic acidosis likely from his respiratory issue  versus mesenteric ischemia.  Anxiety driven? ?-Continue monitoring ?-GI signed off. ?-General surgery following ? ?Essential hypertension ?Changed as needed hydralazine to Cardizem ?Consider clonidine if remains elevated ? ?Sinus tachycardia ?Acute on chronic.  Multifactorial including sepsis, anxiety, alcohol withdrawal and nebulizers ?-Manage sepsis, anxiety and alcohol withdrawal as above ?-Change DuoNeb to Xopenex with Atrovent ?-Continue monitoring in stepdown ?-IV Cardizem 10 mg x 1 followed by p.o. as needed ?-Check urine drug screen ? ?Lactic acidosis ?Continue monitoring. ? 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  07/24/20 ?0428 07/25/20 ?1228 07/26/20 ?0201 07/27/20 ?0109 07/28/20 ?0041 05/21/21 ?1614 05/06/2021 ?1813 05/31/21 ?1048 05/31/21 ?1818 06/01/21 ?0302  ?BUN 9 5* 5* 6 7 7 8 11 11 11   ?CREATININE 1.21 1.07 1.11 1.08 1.27* 1.31* 1.29* 1.27* 1.25* 1.28*  ?-Renal function stable. ?-Continue monitoring ? ?Asthma in adult, mild intermittent, uncomplicated ?I doubt he is an asthma exacerbation.  I believe his respiratory distress is driven by aspiration pneumonia and anxiety. ?-Manage both as above ?-Change DuoNeb to Xopenex and Atrovent, scheduled and as needed given sinus tachycardia. ? ?Hypokalemia ?Resolved. ? ?Hyponatremia, hypokalemia and hypomagnesemia ?Hyponatremia stable.  Hypokalemia and hypomagnesemia resolved. ?-Monitor and replenish as appropriate ? ?Hyponatremia ?Stable.  Continue monitoring. ? ?Abnormal CT of the abdomen ?Noted focal area of linear enhancement along the right lateral bladder wall which may represent debris's or enhancing mass. ?-Further evaluation with cystoscopy or dedicated multiphase CT urography on nonemergent/outpatient basis recommended. ? ?Alcohol abuse ?Tachycardic with tachypnea and elevated blood pressure.  Alcohol withdrawal? ?-Start CIWA with as needed Ativan ?-Thiamine, folic acid and multivitamin ?-Change as needed hydralazine to as  needed Cardizem for tachycardia and hypertension ? ?Anxiety ?Could be driving his respiratory distress.  Started Ativan with CIWA monitoring ? ?Protein-calorie malnutrition, severe ?Consult dietitian ? ? ?DVT prophylaxis:  ?enoxaparin (LOVENOX) injection 40 mg Start: 05/31/21 1800 ? ?Code Status: Full code ?Family Communication: Patient and/or RN. Available if any question.  ?Level of care: Stepdown ?Status is: Inpatient ?Remains inpatient appropriate because: Due to severe sepsis from aspiration pneumonia, hemodynamic instability with significant tachycardia, tachypnea and respiratory distress ? ? ?Final disposition: TBD ? ?Consultants:  ?Gastroenterology ?General surgery ? ?Sch Meds:  ?Scheduled Meds: ? chlorhexidine  15 mL Mouth Rinse BID  ? Chlorhexidine Gluconate Cloth  6 each Topical Daily  ? diltiazem  10 mg Intravenous Once  ? enoxaparin (LOVENOX) injection  40 mg Subcutaneous [id number]  ? folic acid  1 mg Oral Daily  ? ipratropium  0.5 mg Nebulization Q6H  ? levalbuterol  0.63 mg Nebulization Q6H  ? mouth rinse  15 mL Mouth Rinse q12n4p  ? multivitamin with minerals  1 tablet Oral Daily  ? pantoprazole (PROTONIX) IV  40 mg Intravenous Q12H  ? thiamine  100 mg Oral Daily  ? Or  ? thiamine  100 mg Intravenous Daily  ? ?Continuous Infusions: ? azithromycin 500 mg (06/01/21 0947)  ? cefTRIAXone (ROCEPHIN)  IV    ? metronidazole Stopped (06/01/21 0930)  ? ?PRN Meds:.acetaminophen **OR** acetaminophen, diltiazem, ipratropium, levalbuterol, LORazepam **OR** LORazepam, morphine injection, prochlorperazine ? ?Antimicrobials: ?Anti-infectives (From admission, onward)  ? ? Start     Dose/Rate Route Frequency Ordered Stop  ? 06/01/21 1200  cefTRIAXone (ROCEPHIN) 2 g in sodium chloride 0.9 % 100 mL IVPB       ? 2 g ?200 mL/hr over 30 Minutes Intravenous Every 24 hours 06/01/21 0645 06/04/2021 1159  ? 06/01/21 1000  azithromycin (ZITHROMAX) 500 mg in sodium chloride 0.9 % 250 mL IVPB       ? 500 mg ?250 mL/hr over 60 Minutes  Intravenous Every 24 hours 06/01/21 0645 06/04/21 0959  ? 06/01/21 0800  metroNIDAZOLE (FLAGYL) IVPB 500 mg       ? 500 mg ?100 mL/hr over 60 Minutes Intravenous Every 12 hours 06/01/21 0645 06/06/21 0759  ? 03/

## 2021-06-01 NOTE — Hospital Course (Addendum)
[name] with PMH of Barrett esophagus, esophagectomy with gastric pull-through, CKD-3A, solitary right kidney, HTN, asthma, anxiety and alcohol use disorder presenting with shortness of breath and abdominal pain, and admitted for severe sepsis due to possible aspiration pneumonia/pneumonitis and abdominal pain.  Patient had colonoscopy with digestive GI in [place] the day prior to presentation.  It seems that the patient was dealing with issues postprocedure per note.   ?On presentation, he was very tachycardic and tachypneic.  No documented desaturation but started on BiPAP due to work of breathing/dyspnea.  He had a leukocytosis and lactic acidosis.  CT C/A/P concerning for multifocal pneumonia, large hiatal hernia and focal area of linear enhancement along the right lateral bladder wall.  Cultures obtained.  Started on broad-spectrum antibiotics with vancomycin and cefepime, and admitted. ? ?Blood cultures NGTD.  MRSA PCR negative.  CXR with interval progression of asymmetric airspace disease in the right lung and left base.  Antibiotics de-escalated to cefepime, Flagyl and azithromycin.   ? ?Patient remains in severe sepsis with tachycardia, tachypnea, lethargy, altered mental status and significant oxygen requirement.  BNP elevated to 600s.  Received a dose of IV Lasix but creatinine slightly worse.  Resumed IV fluid.  Repeat CT chest/abdomen/pelvis ordered.  ? ?General surgery following for abdominal pain ? ? ? ?

## 2021-06-01 NOTE — Evaluation (Signed)
Clinical/Bedside Swallow Evaluation ?Patient Details  ?Name: Colin Hamilton ?MRN: [id number] ?Date of Birth: 02-Nov-1960 ? ?Today's Date: 06/01/2021 ?Time: SLP Start Time (ACUTE ONLY): 1310 SLP Stop Time (ACUTE ONLY): 1324 ?SLP Time Calculation (min) (ACUTE ONLY): 14 min ? ?Past Medical History:  ?Past Medical History:  ?Diagnosis Date  ? Asthma   ? Asthma in adult, mild intermittent, uncomplicated 06/05/2020  ? Essential hypertension 06/05/2020  ? GERD with esophagitis 06/05/2020  ? History of alcohol abuse 06/05/2020  ? ?Past Surgical History: History reviewed. No pertinent surgical history. ?HPI:  ?[name] with PMH of Barrett's esophagus, esophagectomy with gastric pull-through, CKD-3A, solitary right kidney, HTN, asthma, anxiety and alcohol use disorder presenting with shortness of breath and abdominal pain, and admitted for severe sepsis due to possible aspiration pneumonia/pneumonitis and abdominal pain. Pt with respiratory distress; has been on and off BIPAP/HFNC; imaging concerning for multifocal pna. Pt reports vomiting after any intake; currently on clear liquid diet.  ?  ?Assessment / Plan / Recommendation  ?Clinical Impression ? Colin Hamilton is currently on HFNC; in discomfort with pain in chest upon swallowing. RR 36-42.  He accepted several sips of water (is currently on a clear liquid diet).  Each swallow led to pain in chest; no regurgitation, but we limited further assessment. There are no focal CN deficits; oral mechanism exam is normal. Swallow response/ laryngeal elevation was palpable. Dysphagia is likely esophageal in origin -  I doubt that there is an oropharyngeal component and if aspiration is etiology for pna, it is likely post-prandial/related to his esophageal dx. D/W Dr. Alanda Slim - our service will sign off. ?SLP Visit Diagnosis: Dysphagia, unspecified (R13.10) ?   ? ?    ?  ?Diet Recommendation   Advance per MD and pt's comfort ? ?   ?  ?Other  Recommendations Oral Care Recommendations: Oral  care BID   ? ?Recommendations for follow up therapy are one component of a multi-disciplinary discharge planning process, led by the attending physician.  Recommendations may be updated based on patient status, additional functional criteria and insurance authorization. ? ?Follow up Recommendations No SLP follow up  ? ? ?  ? ? ?Swallow Study   ?General HPI: [name] with PMH of Barrett's esophagus, esophagectomy with gastric pull-through, CKD-3A, solitary right kidney, HTN, asthma, anxiety and alcohol use disorder presenting with shortness of breath and abdominal pain, and admitted for severe sepsis due to possible aspiration pneumonia/pneumonitis and abdominal pain. Pt with respiratory distress; has been on and off BIPAP/HFNC; imaging concerning for multifocal pna. Pt reports vomiting after any intake; currently on clear liquid diet. ?Type of Study: Bedside Swallow Evaluation ?Previous Swallow Assessment: none per [place] records ?Diet Prior to this Study: Thin liquids ?Temperature Spikes Noted: No ?Respiratory Status: Other (comment) (HFNC) ?History of Recent Intubation: No ?Behavior/Cognition: Alert ?Oral Cavity Assessment: Within Functional Limits ?Oral Care Completed by SLP: No ?Vision: Functional for self-feeding ?Self-Feeding Abilities: Able to feed self ?Patient Positioning: Upright in bed ?Baseline Vocal Quality: Normal ?Volitional Cough: Strong ?Volitional Swallow: Able to elicit  ?  ?Oral/Motor/Sensory Function Overall Oral Motor/Sensory Function: Within functional limits   ?Ice Chips Ice chips: Within functional limits   ?Thin Liquid Thin Liquid: Within functional limits  ?  ?Nectar Thick Nectar Thick Liquid: Not tested   ?Honey Thick Honey Thick Liquid: Not tested   ?Puree Puree: Not tested   ?Solid ? ? ?  Solid: Not tested  ? ?  ? ?Blenda Mounts Laurice ?06/01/2021,2:33 PM ? ?Marchelle Folks  Jearld Shines, MA CCC/SLP ?[facility] ?Office number [phone] ?Pager [phone] ? ? ?

## 2021-06-01 NOTE — Assessment & Plan Note (Deleted)
Tachycardic with tachypnea and elevated blood pressure.  Alcohol withdrawal? ?-Start CIWA with as needed Ativan ?-Thiamine, folic acid and multivitamin ?-Change as needed hydralazine to as needed Cardizem for tachycardia and hypertension ?

## 2021-06-01 NOTE — Progress Notes (Signed)
? ?Subjective/Chief Complaint: ?C/o abdominal pain, worsening shortness of breath noted overnight, received a fluid bolus for lactic acidosis, chest x-ray showed worsening multifocal pneumonia on Vanco and cefepime and BiPAP. ?White count is downtrending this morning, hemoglobin stable, afebrile, persistently tachycardic, hypertensive and tachypneic ? ? ?Objective: ?Vital signs in last 24 hours: ?Temp:  [98.2 ?F (36.8 ?C)-98.5 ?F (36.9 ?C)] 98.4 ?F (36.9 ?C) (04/01 6759) ?Pulse Rate:  [106-133] 128 (04/01 0800) ?Resp:  [21-32] 26 (04/01 0800) ?BP: (98-149)/(55-117) 147/117 (04/01 0800) ?SpO2:  [92 %-100 %] 99 % (04/01 0845) ?FiO2 (%):  [30 %-32 %] 30 % (04/01 0600) ?Weight:  [61.2 kg] 61.2 kg (03/31 0900) ?Last BM Date : 05/31/21 ? ?Intake/Output from previous day: ?03/31 0701 - 04/01 0700 ?In: 4429.8 [I.V.:3358.9; IV Piggyback:1071] ?Out: 1925 [Urine:1925] ?Intake/Output this shift: ?Total I/O ?In: -  ?Out: 300 [Urine:300] ? ?Alert, anxious, ?Mildly labored respirations on BiPAP ?Abdomen is soft, nondistended, diffusely tender and seems more so than yesterday's document exam ? ?Lab Results:  ?Recent Labs  ?  05/31/21 ?1048 06/01/21 ?0302  ?WBC 18.2* 12.6*  ?HGB 10.0* 9.8*  ?HCT 31.3* 30.6*  ?PLT 152 129*  ? ?BMET ?Recent Labs  ?  05/31/21 ?1818 06/01/21 ?0302  ?NA 135 134*  ?K 3.1* 3.6  ?CL 100 101  ?CO2 27 25  ?GLUCOSE 98 117*  ?BUN 11 11  ?CREATININE 1.25* 1.28*  ?CALCIUM 8.1* 7.9*  ? ?PT/INR ?Recent Labs  ?  06-08-2021 ?1813  ?LABPROT 12.7  ?INR 1.0  ? ?ABG ?Recent Labs  ?  06/08/2021 ?2217 05/31/21 ?1051  ?PHART 7.448 7.47*  ?HCO3 25.4 27.7  ? ? ?Studies/Results: ?CT CHEST ABDOMEN PELVIS W CONTRAST ? ?Result Date: 2021/06/08 ?CLINICAL DATA:  Sepsis. Status post colonoscopy. Chest pain and shortness of breath. EXAM: CT CHEST, ABDOMEN, AND PELVIS WITH CONTRAST TECHNIQUE: Multidetector CT imaging of the chest, abdomen and pelvis was performed following the standard protocol during bolus administration of intravenous  contrast. RADIATION DOSE REDUCTION: This exam was performed according to the departmental dose-optimization program which includes automated exposure control, adjustment of the mA and/or kV according to patient size and/or use of iterative reconstruction technique. CONTRAST:  50mL OMNIPAQUE IOHEXOL 300 MG/ML  SOLN COMPARISON:  Chest radiograph dated 2021-06-08 and CT dated 05/21/2021. FINDINGS: CT CHEST FINDINGS Cardiovascular: There is no cardiomegaly or pericardial effusion. There is coronary vascular calcification. Mild atherosclerotic calcification of the thoracic aorta. No aneurysmal dilatation or dissection. The origins of the great vessels of the aortic arch appear patent as visualized. The central pulmonary arteries are patent. Mediastinum/Nodes: No hilar or mediastinal adenopathy. There is a large hiatal hernia containing the majority of the stomach. Mild dilatation of the lower esophagus with ingested content which may represent reflux or delayed clearance. This can predispose to aspiration. No mediastinal fluid collection. Status post prior left hemithyroidectomy. Lungs/Pleura: Diffuse scattered nodular densities primarily throughout the left lung most consistent with pneumonia, likely atypical in etiology versus aspiration. Scattered clusters of ground-glass subpleural density noted in the right upper lobe. There is partial compressive atelectasis of the right lower lobe due to mass effect caused by the large hiatal hernia. Partial left lung base consolidation may represent atelectasis or infiltrate. There is a small left pleural effusion. No pneumothorax. The central airways are patent. Musculoskeletal: Status post prior right posterior thoracotomy and rib resection. No acute osseous pathology. CT ABDOMEN PELVIS FINDINGS No intra-abdominal free air or free fluid. Hepatobiliary: No focal liver abnormality is seen. No gallstones, gallbladder wall thickening,  or biliary dilatation. Pancreas: Unremarkable.  No pancreatic ductal dilatation or surrounding inflammatory changes. Spleen: Normal in size without focal abnormality. Adrenals/Urinary Tract: The adrenal glands unremarkable. There is a solitary right kidney. There is no hydronephrosis. The visualized ureters appear unremarkable there is focal area of linear enhancement along the right lateral bladder wall measuring approximately 3 cm in length and 3 mm in thickness. This was not seen on the prior CT. Although this may represent adherent debris, an enhancing mass is not excluded. Further evaluation with cystoscopy or dedicated multiphasic CT urography on a nonemergent/outpatient basis recommended. Stomach/Bowel: There is sigmoid diverticulosis with muscular hypertrophy. There is tethering of the sigmoid colon to the posterior bladder dome consistent with developing adhesions. There is no bowel obstruction. The appendix appears unremarkable extends into the tethered area of colovesical adhesion. Vascular/Lymphatic: Mild aortoiliac atherosclerotic disease. The IVC is unremarkable. No portal venous gas. There is no adenopathy. Reproductive: The prostate and seminal vesicles are grossly unremarkable. The left testicle is located in the left inguinal canal. Other: None Musculoskeletal: No acute or significant osseous findings. IMPRESSION: 1. Diffuse scattered nodular densities primarily throughout the left lung most consistent with pneumonia, likely atypical in etiology versus aspiration. Small left pleural effusion. 2. Large hiatal hernia containing the majority of the stomach. Mild dilatation of the lower esophagus with ingested content which may represent reflux or delayed clearance. This can predispose to aspiration. 3. Sigmoid diverticulosis with muscular hypertrophy. No bowel obstruction. No evidence of perforation. 4. Focal area of linear enhancement along the right lateral bladder wall may represent adherent debris, or an enhancing mass. Further evaluation with  cystoscopy or dedicated multiphasic CT urography on a nonemergent/outpatient basis recommended. 5. Solitary right kidney. No hydronephrosis. 6. Aortic Atherosclerosis (ICD10-I70.0). Electronically Signed   By: Elgie CollardArash  Radparvar M.D.   On: 05/06/2021 20:09  ? ?DG CHEST PORT 1 VIEW ? ?Result Date: 06/01/2021 ?CLINICAL DATA:  Dyspnea EXAM: PORTABLE CHEST 1 VIEW COMPARISON:  05/01/2021 FINDINGS: 0614 hours. Interval progression of airspace disease diffusely in the right lung and left base. Right pleural effusion again noted. The cardio pericardial silhouette is enlarged. Large hiatal hernia again noted. Telemetry leads overlie the chest. IMPRESSION: Interval progression of asymmetric airspace disease in the right lung and left base. Imaging features suspicious for diffuse infection. Electronically Signed   By: Kennith CenterEric  Mansell M.D.   On: 06/01/2021 06:32  ? ?DG Chest Portable 1 View ? ?Result Date: 05/19/2021 ?CLINICAL DATA:  sob EXAM: PORTABLE CHEST 1 VIEW COMPARISON:  Chest x-ray 07/21/2020, CT chest 05/21/2021 FINDINGS: The heart and mediastinal contours are unchanged. Prominent hilar vasculature. Large hiatal hernia overlies the cardiac silhouette. Apparent right lower lung zone patchy airspace opacity likely due to patient rotation. Increased interstitial markings. Right costophrenic angle with suggestion of a trace pleural effusion. No pneumothorax. No acute osseous abnormality. IMPRESSION: 1. Apparent right lower lung zone patchy airspace opacity likely due to patient rotation. 2. Query mild pulmonary edema. 3. Suggestion of a trace right pleural effusion. 4. Blunting of the right costophrenic angle with suggestion of a trace pleural effusion. 5. Large hiatal hernia. Electronically Signed   By: Tish FredericksonMorgane  Naveau M.D.   On: 05/17/2021 18:59   ? ?Anti-infectives: ?Anti-infectives (From admission, onward)  ? ? Start     Dose/Rate Route Frequency Ordered Stop  ? 06/01/21 1200  cefTRIAXone (ROCEPHIN) 2 g in sodium chloride 0.9  % 100 mL IVPB       ? 2 g ?200 mL/hr over 30 Minutes Intravenous  Every 24 hours 06/01/21 0645 06/21/2021 1159  ? 06/01/21 1000  azithromycin (ZITHROMAX) 500 mg in sodium chloride 0.9 % 250 mL IVPB

## 2021-06-01 NOTE — Progress Notes (Signed)
Revisited patient this afternoon.  Patient was sleepy.  Does not appear to be in distress.  Tachycardic to 130s.  Respiratory rate ranges from high 20s to mid 30s.  Saturating in mid 90s on 6 L by HFNC.  He woke up to voice.  Complains chest pain and abdominal pain with deep breathing and cough.  Preliminary on lower extremity venous Doppler negative for DVT but pulsatile LE venous flow suggestive for possible elevated right heart pressure.  Patient had CT chest/abdomen/pelvis with contrast 2 days ago.  Per radiology, cannot rule out central PE based on a CT chest from 3/30.  Procalcitonin elevated.  UDS positive for opiate.  Lactic acid is elevated at 3.1.  Pro-Cal elevated to 6.12.  Follow BNP.  Check echocardiogram.  We will continue antibiotics, aspiration precaution and supplemental oxygen as needed. ?

## 2021-06-01 NOTE — Progress Notes (Signed)
Bilateral lower extremity venous duplex completed. ?Refer to "CV Proc" under chart review to view preliminary results. ? ?06/01/2021 12:21 PM ?Eula Fried., MHA, RVT, RDCS, RDMS   ?

## 2021-06-01 NOTE — Assessment & Plan Note (Addendum)
Acute on chronic.  Likely due to sepsis.  HR in 130s.  Low suspicion for large PE based on his CT chest although not a PE protocol.  Discussed with radiology.  LE venous Doppler negative for DVT as well. ?-Manage sepsis as above ?-Changed DuoNeb to Xopenex with Atrovent ?-Continue monitoring in stepdown ?-IV metoprolol 5 mg every 6 hours with holding parameter ?-Optimize electrolytes ?-Follow echocardiogram ?

## 2021-06-01 NOTE — Assessment & Plan Note (Addendum)
I doubt he is an asthma exacerbation.  I believe his respiratory distress is driven by aspiration pneumonia and anxiety. ?-Changed DuoNeb to Xopenex and Atrovent, scheduled and as needed given sinus tachycardia. ?

## 2021-06-01 NOTE — Assessment & Plan Note (Addendum)
Recent Labs  ?  07/26/20 ?0201 07/27/20 ?0109 07/28/20 ?0041 05/21/21 ?1614 04-Jun-2021 ?1813 05/31/21 ?1048 05/31/21 ?1818 06/01/21 ?0302 06/01/21 ?2320 06/02/21 ?0309  ?BUN 5* 6 7 7 8 11 11 11 11 11   ?CREATININE 1.11 1.08 1.27* 1.31* 1.29* 1.27* 1.25* 1.28* 1.37* 1.36*  ?-Renal function relatively stable. ?-IV fluid as above ?-Continue monitoring ?

## 2021-06-01 NOTE — Assessment & Plan Note (Addendum)
Consult dietitian but p.o. intake limited by acute illness and encephalopathy ?

## 2021-06-01 NOTE — Assessment & Plan Note (Addendum)
Improved.   ?-IVF  ?-continue monitoring. ?

## 2021-06-01 DEATH — deceased

## 2021-06-02 ENCOUNTER — Inpatient Hospital Stay (HOSPITAL_COMMUNITY): Payer: Medicare (Managed Care)

## 2021-06-02 DIAGNOSIS — G9341 Metabolic encephalopathy: Secondary | ICD-10-CM | POA: Diagnosis not present

## 2021-06-02 DIAGNOSIS — A419 Sepsis, unspecified organism: Secondary | ICD-10-CM | POA: Diagnosis not present

## 2021-06-02 DIAGNOSIS — J690 Pneumonitis due to inhalation of food and vomit: Secondary | ICD-10-CM

## 2021-06-02 DIAGNOSIS — J9601 Acute respiratory failure with hypoxia: Secondary | ICD-10-CM | POA: Diagnosis not present

## 2021-06-02 DIAGNOSIS — D696 Thrombocytopenia, unspecified: Secondary | ICD-10-CM

## 2021-06-02 DIAGNOSIS — F1011 Alcohol abuse, in remission: Secondary | ICD-10-CM

## 2021-06-02 DIAGNOSIS — R0603 Acute respiratory distress: Secondary | ICD-10-CM

## 2021-06-02 DIAGNOSIS — R109 Unspecified abdominal pain: Secondary | ICD-10-CM | POA: Diagnosis not present

## 2021-06-02 LAB — ECHOCARDIOGRAM COMPLETE
AR max vel: 3.1 cm2
AV Peak grad: 8.4 mmHg
Ao pk vel: 1.45 m/s
Area-P 1/2: 9.98 cm2
Calc EF: 57.3 %
Height: 64 in
P 1/2 time: 547 msec
S' Lateral: 3 cm
Single Plane A2C EF: 55.3 %
Single Plane A4C EF: 57.2 %
Weight: 2162.27 oz

## 2021-06-02 LAB — BLOOD GAS, ARTERIAL
Acid-Base Excess: 5.9 mmol/L — ABNORMAL HIGH (ref 0.0–2.0)
Acid-Base Excess: 2 mmol/L (ref 0.0–2.0)
Allens test (pass/fail): POSITIVE — AB
Bicarbonate: 29.7 mmol/L — ABNORMAL HIGH (ref 20.0–28.0)
Bicarbonate: 28.2 mmol/L — ABNORMAL HIGH (ref 20.0–28.0)
FIO2: 100 %
MECHVT: 470 mL
O2 Saturation: 94.2 %
O2 Saturation: 99.8 %
PEEP: 8 cmH2O
Patient temperature: 36.6
Patient temperature: 37
RATE: 20 resp/min
pCO2 arterial: 38 mmHg (ref 32–48)
pCO2 arterial: 51 mmHg — ABNORMAL HIGH (ref 32–48)
pH, Arterial: 7.5 — ABNORMAL HIGH (ref 7.35–7.45)
pH, Arterial: 7.35 (ref 7.35–7.45)
pO2, Arterial: 64 mmHg — ABNORMAL LOW (ref 83–108)
pO2, Arterial: 218 mmHg — ABNORMAL HIGH (ref 83–108)

## 2021-06-02 LAB — GLUCOSE, CAPILLARY
Glucose-Capillary: 64 mg/dL — ABNORMAL LOW (ref 70–99)
Glucose-Capillary: 109 mg/dL — ABNORMAL HIGH (ref 70–99)
Glucose-Capillary: 137 mg/dL — ABNORMAL HIGH (ref 70–99)
Glucose-Capillary: 104 mg/dL — ABNORMAL HIGH (ref 70–99)
Glucose-Capillary: 106 mg/dL — ABNORMAL HIGH (ref 70–99)

## 2021-06-02 LAB — CK: Total CK: 78 U/L (ref 49–397)

## 2021-06-02 LAB — COMPREHENSIVE METABOLIC PANEL
ALT: 7 U/L (ref 0–44)
AST: 20 U/L (ref 15–41)
Albumin: 2.7 g/dL — ABNORMAL LOW (ref 3.5–5.0)
Alkaline Phosphatase: 66 U/L (ref 38–126)
Anion gap: 9 (ref 5–15)
BUN: 11 mg/dL (ref 6–20)
CO2: 29 mmol/L (ref 22–32)
Calcium: 8.1 mg/dL — ABNORMAL LOW (ref 8.9–10.3)
Chloride: 96 mmol/L — ABNORMAL LOW (ref 98–111)
Creatinine, Ser: 1.36 mg/dL — ABNORMAL HIGH (ref 0.61–1.24)
GFR, Estimated: 60 mL/min — ABNORMAL LOW (ref >60)
Glucose, Bld: 85 mg/dL (ref 70–99)
Potassium: 3.3 mmol/L — ABNORMAL LOW (ref 3.5–5.1)
Sodium: 134 mmol/L — ABNORMAL LOW (ref 135–145)
Total Bilirubin: 0.6 mg/dL (ref 0.3–1.2)
Total Protein: 6.2 g/dL — ABNORMAL LOW (ref 6.5–8.1)

## 2021-06-02 LAB — CBC WITH DIFFERENTIAL/PLATELET
Abs Immature Granulocytes: 0.31 10*3/uL — ABNORMAL HIGH (ref 0.00–0.07)
Basophils Absolute: 0.1 10*3/uL (ref 0.0–0.1)
Basophils Relative: 0 %
Eosinophils Absolute: 0 10*3/uL (ref 0.0–0.5)
Eosinophils Relative: 0 %
HCT: 41.2 % (ref 39.0–52.0)
Hemoglobin: 13.3 g/dL (ref 13.0–17.0)
Immature Granulocytes: 1 %
Lymphocytes Relative: 3 %
Lymphs Abs: 0.7 10*3/uL (ref 0.7–4.0)
MCH: 28.2 pg (ref 26.0–34.0)
MCHC: 32.3 g/dL (ref 30.0–36.0)
MCV: 87.3 fL (ref 80.0–100.0)
Monocytes Absolute: 1.1 10*3/uL — ABNORMAL HIGH (ref 0.1–1.0)
Monocytes Relative: 5 %
Neutro Abs: 21.2 10*3/uL — ABNORMAL HIGH (ref 1.7–7.7)
Neutrophils Relative %: 91 %
Platelets: 107 10*3/uL — ABNORMAL LOW (ref 150–400)
RBC: 4.72 MIL/uL (ref 4.22–5.81)
RDW: 17.4 % — ABNORMAL HIGH (ref 11.5–15.5)
WBC: 23.3 10*3/uL — ABNORMAL HIGH (ref 4.0–10.5)
nRBC: 0 % (ref 0.0–0.2)

## 2021-06-02 LAB — LACTIC ACID, PLASMA
Lactic Acid, Venous: 2.5 mmol/L (ref 0.5–1.9)
Lactic Acid, Venous: 2.1 mmol/L (ref 0.5–1.9)

## 2021-06-02 LAB — TSH: TSH: 3.734 u[IU]/mL (ref 0.350–4.500)

## 2021-06-02 LAB — PHOSPHORUS: Phosphorus: 2.9 mg/dL (ref 2.5–4.6)

## 2021-06-02 LAB — AMMONIA: Ammonia: 16 umol/L (ref 9–35)

## 2021-06-02 LAB — PROCALCITONIN: Procalcitonin: 7.11 ng/mL

## 2021-06-02 LAB — MAGNESIUM: Magnesium: 1.6 mg/dL — ABNORMAL LOW (ref 1.7–2.4)

## 2021-06-02 MED ORDER — DEXTROSE 50 % IV SOLN
12.5000 g | INTRAVENOUS | Status: AC
  Administered 2021-06-02: 12.5 g via INTRAVENOUS
  Filled 2021-06-02: qty 50

## 2021-06-02 MED ORDER — ETOMIDATE 2 MG/ML IV SOLN
INTRAVENOUS | Status: AC
  Administered 2021-06-02: 20 mg
  Filled 2021-06-02: qty 20

## 2021-06-02 MED ORDER — MIDAZOLAM HCL 2 MG/2ML IJ SOLN: 2.0000 mg | Freq: Once | INTRAMUSCULAR | Status: DC

## 2021-06-02 MED ORDER — FENTANYL CITRATE (PF) 100 MCG/2ML IJ SOLN
INTRAMUSCULAR | Status: AC
  Filled 2021-06-02: qty 2

## 2021-06-02 MED ORDER — FENTANYL CITRATE (PF) 100 MCG/2ML IJ SOLN
100.0000 ug | Freq: Once | INTRAMUSCULAR | Status: AC
  Administered 2021-06-02: 100 ug via INTRAVENOUS

## 2021-06-02 MED ORDER — FENTANYL CITRATE PF 50 MCG/ML IJ SOSY
50.0000 ug | PREFILLED_SYRINGE | Freq: Once | INTRAMUSCULAR | Status: AC
  Administered 2021-06-02: 50 ug via INTRAVENOUS

## 2021-06-02 MED ORDER — MIDAZOLAM HCL 2 MG/2ML IJ SOLN
INTRAMUSCULAR | Status: AC
  Administered 2021-06-02: 2 mg
  Filled 2021-06-02: qty 2

## 2021-06-02 MED ORDER — CEFEPIME HCL 2 G IJ SOLR
2.0000 g | Freq: Two times a day (BID) | INTRAMUSCULAR | Status: DC
Start: 2021-06-02 — End: 2021-06-03
  Administered 2021-06-02 – 2021-06-03 (×3): 2 g via INTRAVENOUS
  Filled 2021-06-02 (×3): qty 2

## 2021-06-02 MED ORDER — FENTANYL BOLUS VIA INFUSION
50.0000 ug | INTRAVENOUS | Status: DC | PRN
  Administered 2021-06-02: 100 ug via INTRAVENOUS
  Administered 2021-06-02 (×2): 50 ug via INTRAVENOUS
  Administered 2021-06-02 – 2021-06-04 (×8): 100 ug via INTRAVENOUS
  Administered 2021-06-05: 50 ug via INTRAVENOUS
  Administered 2021-06-05 – 2021-06-07 (×4): 100 ug via INTRAVENOUS
  Administered 2021-06-08: 50 ug via INTRAVENOUS
  Administered 2021-06-08 (×2): 100 ug via INTRAVENOUS
  Administered 2021-06-08 (×2): 50 ug via INTRAVENOUS
  Administered 2021-06-08: 100 ug via INTRAVENOUS
  Administered 2021-06-08 (×2): 50 ug via INTRAVENOUS
  Administered 2021-06-09: 100 ug via INTRAVENOUS
  Administered 2021-06-09 (×5): 50 ug via INTRAVENOUS
  Administered 2021-06-09 – 2021-06-13 (×6): 100 ug via INTRAVENOUS
  Administered 2021-06-13: 50 ug via INTRAVENOUS
  Filled 2021-06-02: qty 100

## 2021-06-02 MED ORDER — PHENYLEPHRINE 40 MCG/ML (10ML) SYRINGE FOR IV PUSH (FOR BLOOD PRESSURE SUPPORT)
PREFILLED_SYRINGE | INTRAVENOUS | Status: AC
  Filled 2021-06-02: qty 10

## 2021-06-02 MED ORDER — NICOTINE 21 MG/24HR TD PT24
21.0000 mg | MEDICATED_PATCH | Freq: Every day | TRANSDERMAL | Status: DC
  Administered 2021-06-02 – 2021-06-05 (×4): 21 mg via TRANSDERMAL
  Filled 2021-06-02 (×4): qty 1

## 2021-06-02 MED ORDER — ETOMIDATE 2 MG/ML IV SOLN
10.0000 mg | Freq: Once | INTRAVENOUS | Status: AC
  Administered 2021-06-02: 10 mg via INTRAVENOUS

## 2021-06-02 MED ORDER — MAGNESIUM SULFATE 2 GM/50ML IV SOLN
2.0000 g | Freq: Once | INTRAVENOUS | Status: AC
Start: 2021-06-02 — End: 2021-06-02
  Administered 2021-06-02: 2 g via INTRAVENOUS
  Filled 2021-06-02: qty 50

## 2021-06-02 MED ORDER — NOREPINEPHRINE 4 MG/250ML-% IV SOLN
INTRAVENOUS | Status: AC
  Administered 2021-06-02: 4 mg
  Filled 2021-06-02: qty 250

## 2021-06-02 MED ORDER — ROCURONIUM BROMIDE 50 MG/5ML IV SOLN
50.0000 mg | Freq: Once | INTRAVENOUS | Status: AC
Start: 2021-06-02 — End: 2021-06-02
  Administered 2021-06-02: 50 mg via INTRAVENOUS

## 2021-06-02 MED ORDER — POTASSIUM CHLORIDE 10 MEQ/100ML IV SOLN
10.0000 meq | INTRAVENOUS | Status: AC
  Administered 2021-06-02 (×4): 10 meq via INTRAVENOUS
  Filled 2021-06-02 (×4): qty 100

## 2021-06-02 MED ORDER — PROPOFOL 1000 MG/100ML IV EMUL
INTRAVENOUS | Status: AC
  Filled 2021-06-02: qty 100

## 2021-06-02 MED ORDER — LORAZEPAM 2 MG/ML IJ SOLN: 0.5000 mg | Freq: Four times a day (QID) | INTRAMUSCULAR | Status: DC | PRN

## 2021-06-02 MED ORDER — PROPOFOL 1000 MG/100ML IV EMUL: 0.0000 ug/kg/min | INTRAVENOUS | Status: DC

## 2021-06-02 MED ORDER — ORAL CARE MOUTH RINSE
15.0000 mL | OROMUCOSAL | Status: DC
  Administered 2021-06-02 – 2021-06-13 (×106): 15 mL via OROMUCOSAL

## 2021-06-02 MED ORDER — CHLORHEXIDINE GLUCONATE 0.12% ORAL RINSE (MEDLINE KIT)
15.0000 mL | Freq: Two times a day (BID) | OROMUCOSAL | Status: DC
  Administered 2021-06-02 – 2021-06-13 (×22): 15 mL via OROMUCOSAL

## 2021-06-02 MED ORDER — MIDAZOLAM HCL 2 MG/2ML IJ SOLN
2.0000 mg | INTRAMUSCULAR | Status: AC | PRN
  Administered 2021-06-02 – 2021-06-03 (×3): 2 mg via INTRAVENOUS
  Filled 2021-06-02 (×3): qty 2

## 2021-06-02 MED ORDER — HALOPERIDOL LACTATE 5 MG/ML IJ SOLN
5.0000 mg | Freq: Once | INTRAMUSCULAR | Status: AC
  Administered 2021-06-02: 5 mg via INTRAVENOUS
  Filled 2021-06-02: qty 1

## 2021-06-02 MED ORDER — FENTANYL CITRATE (PF) 100 MCG/2ML IJ SOLN
INTRAMUSCULAR | Status: AC
  Administered 2021-06-02: 100 ug
  Filled 2021-06-02: qty 2

## 2021-06-02 MED ORDER — ROCURONIUM BROMIDE 10 MG/ML (PF) SYRINGE
PREFILLED_SYRINGE | INTRAVENOUS | Status: AC
  Filled 2021-06-02: qty 10

## 2021-06-02 MED ORDER — KCL IN DEXTROSE-NACL 20-5-0.9 MEQ/L-%-% IV SOLN
INTRAVENOUS | Status: DC
  Filled 2021-06-02 (×8): qty 1000

## 2021-06-02 MED ORDER — SODIUM CHLORIDE 0.9 % IV SOLN
250.0000 mL | INTRAVENOUS | Status: DC
  Administered 2021-06-02 – 2021-06-12 (×3): 250 mL via INTRAVENOUS

## 2021-06-02 MED ORDER — MIDAZOLAM HCL 2 MG/2ML IJ SOLN
2.0000 mg | INTRAMUSCULAR | Status: DC | PRN
  Administered 2021-06-03: 2 mg via INTRAVENOUS
  Filled 2021-06-02: qty 2

## 2021-06-02 MED ORDER — MIDAZOLAM HCL 2 MG/2ML IJ SOLN
INTRAMUSCULAR | Status: AC
  Filled 2021-06-02: qty 2

## 2021-06-02 MED ORDER — NOREPINEPHRINE 4 MG/250ML-% IV SOLN
2.0000 ug/min | INTRAVENOUS | Status: DC
  Administered 2021-06-03: 8 ug/min via INTRAVENOUS
  Administered 2021-06-03: 10 ug/min via INTRAVENOUS
  Administered 2021-06-04: 5 ug/min via INTRAVENOUS
  Administered 2021-06-05: 7 ug/min via INTRAVENOUS
  Filled 2021-06-02 (×5): qty 250

## 2021-06-02 MED ORDER — POTASSIUM CHLORIDE IN NACL 20-0.9 MEQ/L-% IV SOLN
INTRAVENOUS | Status: DC
Start: 2021-06-02 — End: 2021-06-02
  Filled 2021-06-02: qty 1000

## 2021-06-02 MED ORDER — POLYETHYLENE GLYCOL 3350 17 G PO PACK
17.0000 g | PACK | Freq: Every day | ORAL | Status: DC
  Administered 2021-06-06 – 2021-06-13 (×6): 17 g
  Filled 2021-06-02 (×7): qty 1

## 2021-06-02 MED ORDER — FENTANYL 2500MCG IN NS 250ML (10MCG/ML) PREMIX INFUSION
0.0000 ug/h | INTRAVENOUS | Status: DC
  Administered 2021-06-02: 50 ug/h via INTRAVENOUS
  Administered 2021-06-02: 300 ug/h via INTRAVENOUS
  Administered 2021-06-03: 250 ug/h via INTRAVENOUS
  Administered 2021-06-03: 400 ug/h via INTRAVENOUS
  Administered 2021-06-04 (×2): 200 ug/h via INTRAVENOUS
  Administered 2021-06-04: 350 ug/h via INTRAVENOUS
  Administered 2021-06-05: 300 ug/h via INTRAVENOUS
  Administered 2021-06-06 – 2021-06-08 (×3): 100 ug/h via INTRAVENOUS
  Administered 2021-06-08: 175 ug/h via INTRAVENOUS
  Administered 2021-06-09: 275 ug/h via INTRAVENOUS
  Administered 2021-06-09: 300 ug/h via INTRAVENOUS
  Administered 2021-06-09: 225 ug/h via INTRAVENOUS
  Administered 2021-06-10: 400 ug/h via INTRAVENOUS
  Administered 2021-06-10 (×2): 350 ug/h via INTRAVENOUS
  Administered 2021-06-11 (×2): 400 ug/h via INTRAVENOUS
  Administered 2021-06-11: 300 ug/h via INTRAVENOUS
  Administered 2021-06-12: 400 ug/h via INTRAVENOUS
  Administered 2021-06-12: 325 ug/h via INTRAVENOUS
  Administered 2021-06-12: 400 ug/h via INTRAVENOUS
  Administered 2021-06-12: 300 ug/h via INTRAVENOUS
  Administered 2021-06-13 (×2): 400 ug/h via INTRAVENOUS
  Filled 2021-06-02 (×28): qty 250

## 2021-06-02 MED ORDER — ETOMIDATE 2 MG/ML IV SOLN: 20.0000 mg | Freq: Once | INTRAVENOUS | Status: AC

## 2021-06-02 MED ORDER — DOCUSATE SODIUM 50 MG/5ML PO LIQD
100.0000 mg | Freq: Two times a day (BID) | ORAL | Status: DC
  Administered 2021-06-02 – 2021-06-13 (×13): 100 mg
  Filled 2021-06-02 (×14): qty 10

## 2021-06-02 NOTE — Procedures (Signed)
Intubation Procedure Note ? ?Colin Hamilton  ?BQ:[id number]  ?May 27, 1960 ? ?Date:06/02/21  ?Time:1:11 PM  ? ?Provider Performing:[name]  ? ? ?Procedure: Intubation ([id number]) ? ?Indication(s) ?Respiratory Failure ? ?Consent ?Risks of the procedure as well as the alternatives and risks of each were explained to the patient and/or caregiver.  Consent for the procedure was obtained and is signed in the bedside chart ? ? ?Anesthesia ?Etomidate, Versed, Fentanyl, and Rocuronium ? ? ?Time Out ?Verified patient identification, verified procedure, site/side was marked, verified correct patient position, special equipment/implants available, medications/allergies/relevant history reviewed, required imaging and test results available. ? ? ?Sterile Technique ?Usual hand hygeine, masks, and gloves were used ? ? ?Procedure Description ?Patient positioned in bed supine.  Sedation given as noted above.  Patient was intubated with endotracheal tube using Glidescope.  View was Grade 1 full glottis .  Number of attempts was 1.  Colorimetric CO2 detector was consistent with tracheal placement. ? ? ?Complications/Tolerance ?None; patient tolerated the procedure well. ?Chest X-ray is ordered to verify placement. ? ? ?EBL ?Minimal ? ? ?Specimen(s) ?None ? ? ?Leanna Sato Elsworth Soho MD ?

## 2021-06-02 NOTE — Progress Notes (Signed)
?PROGRESS NOTE ? ?Colin Hamilton JKD:[id number] DOB: [birth year]  ? ?PCP: Pcp, No ? ?Patient is from: Home.  Lives with a friend. ? ?DOA: 05/08/2021 LOS: 2 ? ?Chief complaints ?Chief Complaint  ?Patient presents with  ? Shortness of Breath  ?  ? ?Brief Narrative / Interim history: ?[name] with PMH of Barrett esophagus, esophagectomy with gastric pull-through, CKD-3A, solitary right kidney, HTN, asthma, anxiety and alcohol use disorder presenting with shortness of breath and abdominal pain, and admitted for severe sepsis due to possible aspiration pneumonia/pneumonitis and abdominal pain.  Patient had colonoscopy with digestive GI in [place] the day prior to presentation.  It seems that the patient was dealing with issues postprocedure per note.   ?On presentation, he was very tachycardic and tachypneic.  No documented desaturation but started on BiPAP due to work of breathing/dyspnea.  He had a leukocytosis and lactic acidosis.  CT C/A/P concerning for multifocal pneumonia, large hiatal hernia and focal area of linear enhancement along the right lateral bladder wall.  Cultures obtained.  Started on broad-spectrum antibiotics with vancomycin and cefepime, and admitted. ? ?Blood cultures NGTD.  MRSA PCR negative.  CXR with interval progression of asymmetric airspace disease in the right lung and left base.  Antibiotics de-escalated to cefepime, Flagyl and azithromycin.   ? ?Patient remains in severe sepsis with tachycardia, tachypnea, lethargy, altered mental status and significant oxygen requirement.  BNP elevated to 600s.  Received a dose of IV Lasix but creatinine slightly worse.  Resumed IV fluid.  Repeat CT chest/abdomen/pelvis ordered.  ? ?General surgery following for abdominal pain ? ? ?  ? ?Subjective: ?Seen and examined earlier this morning.  More lethargic and sleepy this morning.  He wakes to voice.  Oriented to self and place.  Follows some commands.  Continues to complain generalized body pain  and abdominal pain.  Abdominal pain is diffuse.  He is not able to elaborate.  RN concerned about dilated left pupil.  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  His other neuro exams are nonfocal. ?Objective: ?Vitals:  ? 06/02/21 0600 06/02/21 0741 06/02/21 0800 06/02/21 0900  ?BP: 100/63  118/79 114/76  ?Pulse: (!) 125  (!) 130 (!) 133  ?Resp: (!) 35  (!) 39 (!) 38  ?Temp:  98.3 ?F (36.8 ?C)    ?TempSrc:  Oral    ?SpO2: 97%  100% 96%  ?Weight:      ?Height:      ? ? ?Examination: ? ?GENERAL: Somewhat lethargic and sleepy. ?HEENT: MMM.  Vision and hearing grossly intact.  ?NECK: Supple.  No apparent JVD.  ?RESP: 100% on 30 L.  Tachypneic to mid 30s.  No IWOB.  Fair aeration bilaterally. ?CVS: Tachycardic to 130s. Heart sounds normal.  ?ABD/GI/GU: BS+. Abd soft.  Diffuse tenderness.  Rebound? ?MSK/EXT:  Moves extremities but awake..  Significant muscle mass and subcu fat loss. ?SKIN: no apparent skin lesion or wound ?NEURO: Somewhat lethargic and somnolent.  Wakes to voice.  Oriented to self and place.  Follows some commands.  Patellar reflex symmetric.  No focal neurodeficits other than dilated left pupil (likely chronic) and generalized weakness but limited exam due to mental status.Limited exam due to mental status. ?PSYCH: Lethargic and somewhat somnolent. ? ?Procedures:  ?None ? ?Microbiology summarized: ?COVID-19 and influenza PCR nonreactive. ?Blood cultures NGTD. ?Urine culture with insignificant growth. ?MRSA PCR negative. ? ?Assessment and Plan: ?* Severe sepsis due to aspiration pneumonia ? CT chest with diffuse scattered nodular  densities mainly throughout the left lung most consistent with atypical pneumonia versus aspiration.  However, patient is at risk for aspiration pneumonia given large hiatal hernia and recent colonoscopy. CXR with interval progression of asymmetric airspace disease in the right lung the left base.  Blood cultures NGTD.  MRSA PCR negative.  Still tachycardic  and tachypneic with significant oxygen requirement.  Also some degree of encephalopathy.  Leukocytosis and Pro-Cal uptrending. ?-Continue IV Flagyl and azithromycin ?-Change ceftriaxone to cefepime ?-Wean oxygen as able.  BiPAP intermittently ?-Resume IV fluid with D5 NS-KCl at 125 cc an hour.  Significant insensible loss from tachycardia pi?a. ?-Discontinue CIWA monitoring.  Denies EtOH use in 3 weeks. ?-Trend lactic acid-improved. ?-Repeat CT chest/abdomen/pelvis ?-Aspiration precaution and SLP ?-Clear liquid diet ? ?Respiratory distress ?Likely due to aspiration pneumonia/pneumonitis, anxiety and possible alcohol withdrawal.  Doubt asthma exacerbation.  ABG suggest this respiratory alkalosis.  Imaging concerning for multifocal pneumonia likely from aspiration versus CHF.  CT chest also suggestive of atypical pneumonia. ?-Wean oxygen as able. ?-BiPAP as needed for work of breathing ?-See severe sepsis for management ? ?Acute metabolic encephalopathy ?He is somewhat lethargic and drowsy.  Wakes to voice.  Oriented to self and place.  Follows some commands. No focal neurodeficit other than dilated left pupil (chronic).  Likely from severe sepsis.  Could be some degree of toxic encephalopathy from opiate and benzo.  Ammonia within normal.  ABG suggests respiratory alkalosis. ?-Discontinue CIWA with Ativan.  Use Ativan 0.5 mg as needed for anxiety ?-Changed morphine to Dilaudid-as needed for severe pain.  Would minimize use ?-Treat sepsis as above ?-Reorientation and delirium precautions ?-Minimum oxygen to keep appropriate saturation. ? ?Aspiration pneumonia (HCC) ?See severe sepsis ? ?Abdominal pain ?CT A/P on 3/21 with large hiatal hernia, persistent proximal mid sigmoid colon bowel wall thickening raising concern for infiltrative mass and diffuse sigmoid diverticulosis without diverticulitis.  CT A/P on 3/30 showed pneumonia, known large hiatal hernia and possible debris's or mass in bladder.  Had a colonoscopy  with digestive GI on 3/30.  Reports persistent abdominal pain.  Has diffuse tenderness.  ?-GI signed off. ?-Obtain reports of colonoscopy from digestive GI ?-General surgery following ?-Repeat CT C/A/P given persistent severe sepsis physiology ? ?Essential hypertension ?Normotensive for most part. ?-IV metoprolol 5 mg every 6 hours as needed ?-IV fluid ? ?Sinus tachycardia ?Acute on chronic.  Likely due to sepsis.  HR in 130s.  Low suspicion for large PE based on his CT chest although not a PE protocol.  Discussed with radiology.  LE venous Doppler negative for DVT as well. ?-Manage sepsis as above ?-Changed DuoNeb to Xopenex with Atrovent ?-Continue monitoring in stepdown ?-IV metoprolol 5 mg every 6 hours with holding parameter ?-Optimize electrolytes ?-Follow echocardiogram ? ?Hyponatremia, hypokalemia and hypomagnesemia ?Hyponatremia stable.  K3.3.  Mg 1.6. ?-IV D5-NS-KCl 20 at 125 cc an hour ?-IV KCl 10x4 ?-IV magnesium sulfate 2 g x 1 ?-Continue monitoring ? ?Lactic acidosis ?Improved.   ?-IVF  ?-continue monitoring. ? 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  07/26/20 ?0201 07/27/20 ?0109 07/28/20 ?0041 05/21/21 ?1614 05/29/2021 ?1813 05/31/21 ?1048 05/31/21 ?1818 06/01/21 ?0302 06/01/21 ?2320 06/02/21 ?0309  ?BUN 5* 6 7 7 8 11 11 11 11 11   ?CREATININE 1.11 1.08 1.27* 1.31* 1.29* 1.27* 1.25* 1.28* 1.37* 1.36*  ?-Renal function relatively stable. ?-IV fluid as above ?-Continue monitoring ? ?Asthma in adult, mild intermittent, uncomplicated ?I doubt he is an asthma exacerbation.  I believe his  respiratory distress is driven by aspiration pneumonia and anxiety. ?-Changed DuoNeb to Xopenex and Atrovent, scheduled and as needed given sinus tachycardia. ? ?History of alcohol abuse ?Denies drinking alcohol in the last 3 weeks.  Initially, some concern about alcohol withdrawal based on anxiety and hemodynamics which did not change with as needed Ativan.  He is somewhat somnolent now. ?-Discontinue  CIWA monitoring ?-Use Ativan as needed for anxiety ?-Continue vitamins. ? ?Thrombocytopenia (HCC) ?Likely due to severe sepsis. ?-Continue monitoring ? ?Hyponatremia ?Stable.  ?-IV fluid ? ?Abnormal CT

## 2021-06-02 NOTE — Progress Notes (Signed)
An USGPIV (ultrasound guided PIV) has been placed for short-term vasopressor infusion. A correctly placed ivWatch must be used when administering Vasopressors. Should this treatment be needed beyond 72 hours, central line access should be obtained.  It will be the responsibility of the bedside nurse to follow best practice to prevent extravasations.   ?

## 2021-06-02 NOTE — Progress Notes (Signed)
Pharmacy Antibiotic Note ? ?Colin Hamilton is a 61 y.o. male admitted on 05/21/2021 with pneumonia.  Pharmacy has been consulted for cefepime dosing. ?4/1 CXR: Interval progression of asymmetric airspace disease in the right lung and left base. Imaging features suspicious for diffuse ?infection. ? ?Plan: ?Cefepime 2 gm IV q12 ?Flagyl 500 mg IV q12 D #2/5 per MD ?Azithromycin 500 mg IV q24 D#2/3 per MD ? ?Height: 5\' 4"  (162.6 cm) ?Weight: 61.3 kg (135 lb 2.3 oz) ?IBW/kg (Calculated) : 59.2 ? ?Temp (24hrs), Avg:99.1 ?F (37.3 ?C), Min:98.2 ?F (36.8 ?C), Max:100.8 ?F (38.2 ?C) ? ?Recent Labs  ?Lab 05/11/2021 ?1813 05/22/2021 ?2101 05/31/21 ?1048 05/31/21 ?1818 05/31/21 ?1900 06/01/21 ?0302 06/01/21 ?0303 06/01/21 ?08/01/21 06/01/21 ?1012 06/01/21 ?2320 06/02/21 ?0020 06/02/21 ?0309  ?WBC 13.2*  --  18.2*  --   --  12.6*  --   --   --  21.9*  --  23.3*  ?CREATININE 1.29*  --  1.27* 1.25*  --  1.28*  --   --   --  1.37*  --  1.36*  ?LATICACIDVEN 4.9*   < >  --   --    < >  --  3.0* 2.9* 3.1*  --  2.5* 2.1*  ? < > = values in this interval not displayed.  ?  ?Estimated Creatinine Clearance: 49 mL/min (A) (by C-G formula based on SCr of 1.36 mg/dL (H)).   ? ?Allergies  ?Allergen Reactions  ? Penicillins Swelling  ? ?Antimicrobials this admission:  ?Cefepime 3/30> 4/1  4/2>> ?Flagyl 3/30 x 1 , 4/1>> (4/5) ?Vanc 3/30> 3/31 ?4/1 azith>>(4/3) ?4/1 CTX x 1 ?Dose adjustments this admission:  ? ?Microbiology results:  ?3/31 MRSA neg ?3/31 strep pneumo neg ?3/30 BCx ngtd ?3/30 UCx insig growth F ? ?Thank you for allowing pharmacy to be a part of this patient?s care. ? ?4/30, Pharm.D ?06/02/2021 7:27 AM ? ?

## 2021-06-02 NOTE — Assessment & Plan Note (Addendum)
He is somewhat lethargic and drowsy.  Wakes to voice.  Oriented to self and place.  Follows some commands. No focal neurodeficit other than dilated left pupil (chronic).  Likely from severe sepsis.  Could be some degree of toxic encephalopathy from opiate and benzo.  Ammonia within normal.  ABG suggests respiratory alkalosis. ?-Discontinue CIWA with Ativan.  Use Ativan 0.5 mg as needed for anxiety ?-Changed morphine to Dilaudid-as needed for severe pain.  Would minimize use ?-Treat sepsis as above ?-Reorientation and delirium precautions ?-Minimum oxygen to keep appropriate saturation. ?

## 2021-06-02 NOTE — Progress Notes (Signed)
eLink Physician-Brief Progress Note ?Patient Name: Colin Hamilton ?DOB: 10-04-1960 ?MRN: [id number] ? ? ?Date of Service ? 06/02/2021  ?HPI/Events of Note ? Bladder scan > 450. ?Cr 1.36  ?eICU Interventions ? I and O cath ordered   ? ? ? ?Intervention Category ?Intermediate Interventions: Other: (retension) ? ?Ranee Gosselin ?06/02/2021, 11:22 PM ?

## 2021-06-02 NOTE — Progress Notes (Signed)
Per CCM order- placed PT on heated high flow (tolerating werll at this time. RN aware. Vitals on Flowsheet. ?

## 2021-06-02 NOTE — Progress Notes (Signed)
? ?Subjective/Chief Complaint: ?Still reporting some upper abdominal pain, but that it is better than yesterday.  Respiratory status continues to be quite tenuous. ? ? ?Objective: ?Vital signs in last 24 hours: ?Temp:  [98.2 ?F (36.8 ?C)-100.8 ?F (38.2 ?C)] 98.2 ?F (36.8 ?C) (04/02 0400) ?Pulse Rate:  [114-158] 125 (04/02 0600) ?Resp:  [26-46] 35 (04/02 0600) ?BP: (82-189)/(51-117) 100/63 (04/02 0600) ?SpO2:  [90 %-100 %] 97 % (04/02 0600) ?FiO2 (%):  [32 %-50 %] 50 % (04/02 0222) ?Weight:  [61.3 kg] 61.3 kg (04/02 0500) ?Last BM Date : 05/31/21 ? ?Intake/Output from previous day: ?04/01 0701 - 04/02 0700 ?In: 1209.1 [I.V.:680.6; IV Piggyback:528.6] ?Out: 2700 [Urine:2700] ?Intake/Output this shift: ?No intake/output data recorded. ? ?Alert, anxious, ?Mildly labored respirations on BiPAP ?Abdomen is soft, nondistended, remains tender in the upper fields without peritoneal signs ? ?Lab Results:  ?Recent Labs  ?  06/01/21 ?2320 06/02/21 ?0309  ?WBC 21.9* 23.3*  ?HGB 11.7* 13.3  ?HCT 37.2* 41.2  ?PLT PLATELET CLUMPS NOTED ON SMEAR, UNABLE TO ESTIMATE 107*  ? ? ?BMET ?Recent Labs  ?  06/01/21 ?2320 06/02/21 ?0309  ?NA 135 134*  ?K 3.7 3.3*  ?CL 97* 96*  ?CO2 26 29  ?GLUCOSE 87 85  ?BUN 11 11  ?CREATININE 1.37* 1.36*  ?CALCIUM 8.4* 8.1*  ? ? ?PT/INR ?Recent Labs  ?  05/01/2021 ?1813  ?LABPROT 12.7  ?INR 1.0  ? ? ?ABG ?Recent Labs  ?  05/20/2021 ?2217 05/31/21 ?1051 06/01/21 ?2314  ?PHART 7.448 7.47*  --   ?HCO3 25.4 27.7 32.1*  ? ? ? ?Studies/Results: ?DG CHEST PORT 1 VIEW ? ?Result Date: 06/01/2021 ?CLINICAL DATA:  Dyspnea EXAM: PORTABLE CHEST 1 VIEW COMPARISON:  05/29/2021 FINDINGS: 0614 hours. Interval progression of airspace disease diffusely in the right lung and left base. Right pleural effusion again noted. The cardio pericardial silhouette is enlarged. Large hiatal hernia again noted. Telemetry leads overlie the chest. IMPRESSION: Interval progression of asymmetric airspace disease in the right lung and left base.  Imaging features suspicious for diffuse infection. Electronically Signed   By: Misty Stanley M.D.   On: 06/01/2021 06:32  ? ?VAS [place] LOWER EXTREMITY VENOUS (DVT) ? ?Result Date: 06/01/2021 ? Lower Venous DVT Study Patient Name:  Colin Hamilton  Date of Exam:   06/01/2021 Medical Rec #: [id number]        Accession #:    [id number] Date of Birth: [birth year]         Patient Gender: M Patient Age:   61 years Exam Location:  [facility] Procedure:      VAS [place] LOWER EXTREMITY VENOUS (DVT) Referring Phys: Bretta Bang GONFA --------------------------------------------------------------------------------  Indications: Respiratory distress.  Comparison Study: No prior study Performing Technologist: Maudry Mayhew MHA, RDMS, RVT, RDCS  Examination Guidelines: A complete evaluation includes B-mode imaging, spectral Doppler, color Doppler, and power Doppler as needed of all accessible portions of each vessel. Bilateral testing is considered an integral part of a complete examination. Limited examinations for reoccurring indications may be performed as noted. The reflux portion of the exam is performed with the patient in reverse Trendelenburg.  +---------+---------------+---------+-----------+----------+--------------+ RIGHT    CompressibilityPhasicitySpontaneityPropertiesThrombus Aging +---------+---------------+---------+-----------+----------+--------------+ CFV      Full           No       Yes                                 +---------+---------------+---------+-----------+----------+--------------+  SFJ      Full                                                        +---------+---------------+---------+-----------+----------+--------------+ FV Prox  Full                                                        +---------+---------------+---------+-----------+----------+--------------+ FV Mid   Full                                                         +---------+---------------+---------+-----------+----------+--------------+ FV DistalFull                                                        +---------+---------------+---------+-----------+----------+--------------+ PFV      Full                                                        +---------+---------------+---------+-----------+----------+--------------+ POP      Full           Yes      Yes                                 +---------+---------------+---------+-----------+----------+--------------+ PTV      Full                                                        +---------+---------------+---------+-----------+----------+--------------+ PERO     Full                                                        +---------+---------------+---------+-----------+----------+--------------+   +---------+---------------+---------+-----------+----------+--------------+ LEFT     CompressibilityPhasicitySpontaneityPropertiesThrombus Aging +---------+---------------+---------+-----------+----------+--------------+ CFV      Full           No       Yes                                 +---------+---------------+---------+-----------+----------+--------------+ SFJ      Full                                                        +---------+---------------+---------+-----------+----------+--------------+  FV Prox  Full                                                        +---------+---------------+---------+-----------+----------+--------------+ FV Mid   Full                                                        +---------+---------------+---------+-----------+----------+--------------+ FV DistalFull                                                        +---------+---------------+---------+-----------+----------+--------------+ PFV      Full                                                         +---------+---------------+---------+-----------+----------+--------------+ POP      Full           No       Yes                                 +---------+---------------+---------+-----------+----------+--------------+ PTV      Full                                                        +---------+---------------+---------+-----------+----------+--------------+ PERO     Full                                                        +---------+---------------+---------+-----------+----------+--------------+     Summary: RIGHT: - There is no evidence of deep vein thrombosis in the lower extremity.  - No cystic structure found in the popliteal fossa.  LEFT: - There is no evidence of deep vein thrombosis in the lower extremity.  - No cystic structure found in the popliteal fossa.  Pulsatile lower extremity venous flow is suggestive of possibly elevated right heart pressure.  *See table(s) above for measurements and observations.    Preliminary    ? ?Anti-infectives: ?Anti-infectives (From admission, onward)  ? ? Start     Dose/Rate Route Frequency Ordered Stop  ? 06/02/21 0800  ceFEPIme (MAXIPIME) 2 g in sodium chloride 0.9 % 100 mL IVPB       ? 2 g ?200 mL/hr over 30 Minutes Intravenous Every 12 hours 06/02/21 0704    ? 06/01/21 1200  cefTRIAXone (ROCEPHIN) 2 g in sodium chloride 0.9 % 100 mL IVPB  Status:  Discontinued       ? 2 g ?200 mL/hr over  30 Minutes Intravenous Every 24 hours 06/01/21 0645 06/02/21 0656  ? 06/01/21 1000  azithromycin (ZITHROMAX) 500 mg in sodium chloride 0.9 % 250 mL IVPB       ? 500 mg ?250 mL/hr over 60 Minutes Intravenous Every 24 hours 06/01/21 0645 06/04/21 0959  ? 06/01/21 0800  metroNIDAZOLE (FLAGYL) IVPB 500 mg       ? 500 mg ?100 mL/hr over 60 Minutes Intravenous Every 12 hours 06/01/21 0645 06/06/21 0759  ? 05/31/21 1000  ceFEPIme (MAXIPIME) 2 g in sodium chloride 0.9 % 100 mL IVPB  Status:  Discontinued       ? 2 g ?200 mL/hr over 30 Minutes Intravenous Every 12  hours 05/24/2021 1906 06/01/21 0645  ? 05/31/21 1000  vancomycin (VANCOCIN) 500 mg in sodium chloride 0.9 % 100 mL IVPB  Status:  Discontinued       ? 500 mg ?100 mL/hr over 60 Minutes Intravenous Every 12 hours 05/23/2021 1906 05/31/21 0910  ? 05/31/21 1000  vancomycin (VANCOREADY) IVPB 500 mg/100 mL  Status:  Discontinued       ? 500 mg ?100 mL/hr over 60 Minutes Intravenous Every 12 hours 05/31/21 0910 06/01/21 0645  ? 03/30/

## 2021-06-02 NOTE — Consult Note (Signed)
? ?NAME:  Colin Hamilton, MRN:  [id number], DOB:  05-28-1960, LOS: 2 ?ADMISSION DATE:  05/04/2021, CONSULTATION DATE:  06/02/2021 ? ?REFERRING MD:  Cyndia Skeeters, TRH , CHIEF COMPLAINT:  resp distress  ? ?History of Present Illness:  ?61 year old admitted 3/30 with shortness of breath and abdominal pain -1 day after colonoscopy- with lactic acidosis, chest x-ray showing bilateral asymmetric airspace disease, presumptive diagnosis of aspiration pneumonia with sepsis ?He was treated with empiric cefepime/vancomycin , BiPAP for work of breathing ?Seen by general surgery for abdominal pain ? ?CT chest/abdomen/pelvis showed multifocal pneumonia, large hiatal hernia and an area of enhancement around the right bladder wall ?PCCM consulted on 4/2 for increased work of breathing, tachycardia and encephalopathy ?Consultants: GI, general surgery ?I have reviewed ED, initial H&P and above consultants notes ? ?Pertinent  Medical History  ?Barrett's esophagus ?Esophagectomy with gastric pull-through ?Solitary right kidney , CKD stage III yea ?Alcohol use ? ?Significant Hospital Events: ?Including procedures, antibiotic start and stop dates in addition to other pertinent events   ?3/30 CT chest/abdomen/pelvis -nodular infiltrates especially in left lung, large hiatal hernia versus gastric polyp, sigmoid diverticulosis, enhancement along right lateral bladder wall ? ?Interim History / Subjective:  ?Appears critically ill, tachycardic to 150s ?Desaturation to 70s while on bedpan on 7 L oxygen ? ?Objective   ?Blood pressure (!) 153/85, pulse (!) 136, temperature 98.3 ?F (36.8 ?C), temperature source Oral, resp. rate (!) 44, height '5\' 4"'  (1.626 m), weight 61.3 kg, SpO2 95 %. ?   ?FiO2 (%):  [50 %] 50 %  ? ?Intake/Output Summary (Last 24 hours) at 06/02/2021 1112 ?Last data filed at 06/02/2021 1003 ?Gross per 24 hour  ?Intake 870.4 ml  ?Output 2100 ml  ?Net -1229.6 ml  ? ?Filed Weights  ? 05/09/2021 2000 05/31/21 0900 06/02/21 0500  ?Weight: 55.3 kg  61.2 kg 61.3 kg  ? ? ?Examination: ?General: Frail man, appears older than stated age, moderate distress, on salter nasal cannula ?HENT: No pallor or icterus, no JVD, left pupil 4 mm, right 2 mm ?Lungs: Crackles left base, accessory muscle use, no rhonchi ?Cardiovascular: S1-S2 tacky, regular ?Abdomen: Soft, nontender, no organomegaly ?Extremities: No deformity ?Neuro: Awake, interactive, intermittent hallucinations per RN, follows commands ? ? ?Labs show hyponatremia, mild hypokalemia, hypomagnesemia, leukocytosis and elevated procalcitonin, lactate decreasing, mild thrombocytopenia ? ?Chest x-ray independently reviewed shows worsening bilateral infiltrates and large hiatal hernia ?Postintubation chest x-ray shows ET tube 1 cm above carina and NG tube coursing beyond tracheal stripe so likely esophageal placement ? ?Resolved Hospital Problem list   ? ? ?Assessment & Plan:  ?Acute hypoxic respiratory failure ?Probable aspiration pneumonia ? ?-Change to heated high flow nasal cannula but continued to desaturate ?-Intubated, vent settings reviewed and adjusted add PEEP of 8 ?-Obtain respiratory culture ?-Continue empiric cefepime ? ?Likely underlying COPD versus history of asthma ?Smoker ? ?-Xopenex plus Atrovent nebs ? ?Acute metabolic encephalopathy ?Sedation needs for mechanical ventilation ?Last EtOH use 3 weeks ago so doubt withdrawal ?Unequal pupils ?-Use fentanyl drip and intermittent Versed with goal RASS -2 ? ? ?CKD stage II ?Solitary kidney ?-Follow B met, avoid nephrotoxins ?Hypokalemia and hypomagnesemia will be repleted ? ?Abdominal pain ?Large hiatal hernia ?Sigmoid diverticulosis with concern for sigmoid mass ?-General surgery following, await reports of colonoscopy performed last week ?-Repeat CT chest and abdomen ordered by tried ?-Difficult OG tube placement due to hiatal hernia, may need IR to place before starting tube feeds ? ? ?Bladder wall thickening ?-Evaluation by urology/cystoscopy once acute  issues  resolved ? ?Best Practice (right click and "Reselect all SmartList Selections" daily)  ? ?Diet/type: clear liquids ?DVT prophylaxis: LMWH ?GI prophylaxis: PPI ?Lines: N/A ?Foley:  N/A ?Code Status:  full code ?Last date of multidisciplinary goals of care discussion [NA] ? ?Labs   ?CBC: ?Recent Labs  ?Lab 05/08/2021 ?1813 05/29/2021 ?1831 05/08/2021 ?2217 05/31/21 ?1048 06/01/21 ?0302 06/01/21 ?2320 06/02/21 ?0309  ?WBC 13.2*  --   --  18.2* 12.6* 21.9* 23.3*  ?NEUTROABS 11.6*  --   --  15.6*  --  19.5* 21.2*  ?HGB 13.5   < > 11.9* 10.0* 9.8* 11.7* 13.3  ?HCT 43.8   < > 35.0* 31.3* 30.6* 37.2* 41.2  ?MCV 89.9  --   --  89.4 89.7 90.3 87.3  ?PLT 201  --   --  152 129* PLATELET CLUMPS NOTED ON SMEAR, UNABLE TO ESTIMATE 107*  ? < > = values in this interval not displayed.  ? ? ?Basic Metabolic Panel: ?Recent Labs  ?Lab 05/31/21 ?1048 05/31/21 ?1818 06/01/21 ?0302 06/01/21 ?2320 06/02/21 ?0309  ?NA 134* 135 134* 135 134*  ?K 3.3* 3.1* 3.6 3.7 3.3*  ?CL 99 100 101 97* 96*  ?CO2 '28 27 25 26 29  ' ?GLUCOSE 110* 98 117* 87 85  ?BUN '11 11 11 11 11  ' ?CREATININE 1.27* 1.25* 1.28* 1.37* 1.36*  ?CALCIUM 7.9* 8.1* 7.9* 8.4* 8.1*  ?MG 1.1* 2.4  --  1.7 1.6*  ?PHOS  --  2.8  --   --  2.9  ? ?GFR: ?Estimated Creatinine Clearance: 49 mL/min (A) (by C-G formula based on SCr of 1.36 mg/dL (H)). ?Recent Labs  ?Lab 05/31/21 ?1048 05/31/21 ?1900 06/01/21 ?0302 06/01/21 ?0303 06/01/21 ?4098 06/01/21 ?1012 06/01/21 ?2320 06/02/21 ?0020 06/02/21 ?0309  ?PROCALCITON  --   --   --   --   --  6.12  --   --  7.11  ?WBC 18.2*  --  12.6*  --   --   --  21.9*  --  23.3*  ?LATICACIDVEN  --    < >  --    < > 2.9* 3.1*  --  2.5* 2.1*  ? < > = values in this interval not displayed.  ? ? ?Liver Function Tests: ?Recent Labs  ?Lab 05/25/2021 ?1813 05/31/21 ?1048 05/31/21 ?1818 06/02/21 ?0309  ?AST 16 17  --  20  ?ALT 7 8  --  7  ?ALKPHOS 90 50  --  66  ?BILITOT 0.6 0.5  --  0.6  ?PROT 8.6* 6.1*  --  6.2*  ?ALBUMIN 4.6 2.9* 2.7* 2.7*  ? ?Recent Labs  ?Lab  05/31/21 ?1048  ?LIPASE 19  ? ?Recent Labs  ?Lab 06/02/21 ?0819  ?AMMONIA 16  ? ? ?ABG ?   ?Component Value Date/Time  ? PHART 7.5 (H) 06/02/2021 0932  ? PCO2ART 38 06/02/2021 0932  ? PO2ART 64 (L) 06/02/2021 0932  ? HCO3 29.7 (H) 06/02/2021 0932  ? TCO2 27 05/02/2021 2217  ? ACIDBASEDEF 2.0 05/13/2021 1831  ? O2SAT 94.2 06/02/2021 0932  ?  ? ?Coagulation Profile: ?Recent Labs  ?Lab 05/06/2021 ?1813  ?INR 1.0  ? ? ?Cardiac Enzymes: ?Recent Labs  ?Lab 06/02/21 ?0309  ?CKTOTAL 78  ? ? ?HbA1C: ?Hgb A1c MFr Bld  ?Date/Time Value Ref Range Status  ?06/05/2020 09:47 PM 5.4 4.8 - 5.6 % Final  ?  Comment:  ?  (NOTE) ?Pre diabetes:          5.7%-6.4% ? ?Diabetes:              >  6.4% ? ?Glycemic control for   <7.0% ?adults with diabetes ?  ? ? ?CBG: ?Recent Labs  ?Lab 06/02/21 ?0806 06/02/21 ?7741  ?GLUCAP 64* 109*  ? ? ?Review of Systems:   ?Unable to obtain due to respiratory distress ? ?Past Medical History:  ?He,  has a past medical history of Asthma, Asthma in adult, mild intermittent, uncomplicated (04/10/7865), Essential hypertension (06/05/2020), GERD with esophagitis (06/05/2020), and History of alcohol abuse (06/05/2020).  ? ?Surgical History:  ?History reviewed. No pertinent surgical history.  ? ?Social History:  ? reports that he has quit smoking. He has never used smokeless tobacco. He reports current alcohol use. He reports current drug use. Drug: Marijuana.  ? ?Family History:  ?His family history includes Cancer in his sister.  ? ?Allergies ?Allergies  ?Allergen Reactions  ? Penicillins Swelling  ?  ? ?Home Medications  ?Prior to Admission medications   ?Medication Sig Start Date End Date Taking? Authorizing Provider  ?albuterol (VENTOLIN HFA) 108 (90 Base) MCG/ACT inhaler Inhale 2 puffs into the lungs every 6 (six) hours as needed for wheezing or shortness of breath.   Yes [provider]  ?cyclobenzaprine (FLEXERIL) 10 MG tablet Take 1 tablet (10 mg total) by mouth 3 (three) times daily as needed for muscle  spasms. 07/28/20  Yes Oswald Hillock, MD  ?docusate sodium (COLACE) 100 MG capsule Take 100 mg by mouth 2 (two) times daily as needed for mild constipation or moderate constipation.   Yes Provider, Historical,

## 2021-06-02 NOTE — Progress Notes (Signed)
Encephalopathy, tachycardia and tachypnea worse. Also restless. Asking to go out and smoke. ?Add nicotine patches  ?PCCM consulted

## 2021-06-02 NOTE — Progress Notes (Signed)
A consult was placed to the IV Therapist for an ultrasound guided PIV, needed for vasopressors;  pt currently has 2 IV sites, both in the upper arms;  pt is intubated, and when beginning to remove the wrist restraints to assess with ultrasound, pt became agitated;  RN to give further sedation prior to any further assessing for more iv access;  she will update the IV Team consult when the pt is more relaxed.   ?

## 2021-06-02 NOTE — Procedures (Signed)
Intubation Procedure Note ? ?Colin Hamilton  ?[id number]  ?04/19/1960 ? ?Date:06/02/21  ?Time:1:30 PM  ? ?Provider Performing:Keanu Lesniak V. Elsworth Soho  ? ? ?Procedure: Intubation ([id number]) ? ?Indication(s) ?Respiratory Failure ?CUff blown on previously inserted ETT, vent unable to deliver TV ? ?Consent ?Unable to obtain consent due to emergent nature of procedure. ? ? ?Anesthesia ?Etomidate and Fentanyl ? ? ?Time Out ?Verified patient identification, verified procedure, site/side was marked, verified correct patient position, special equipment/implants available, medications/allergies/relevant history reviewed, required imaging and test results available. ? ? ?Sterile Technique ?Usual hand hygeine, masks, and gloves were used ? ? ?Procedure Description ?Patient positioned in bed supine.  Sedation given as noted above.  Patient was intubated with endotracheal tube using Glidescope.  View was Grade 1 full glottis .  Number of attempts was 1.  Colorimetric CO2 detector was consistent with tracheal placement. ? ? ?Complications/Tolerance ?None; patient tolerated the procedure well. ?Chest X-ray is ordered to verify placement. ? ? ?EBL ?Minimal ? ? ?Specimen(s) ?None ? ?Leanna Sato Elsworth Soho MD ? ?

## 2021-06-02 NOTE — Assessment & Plan Note (Signed)
Likely due to severe sepsis. ?-Continue monitoring ?

## 2021-06-02 NOTE — Progress Notes (Signed)
Notified Lab that ABG being sent for analysis. 

## 2021-06-02 NOTE — Assessment & Plan Note (Signed)
Denies drinking alcohol in the last 3 weeks.  Initially, some concern about alcohol withdrawal based on anxiety and hemodynamics which did not change with as needed Ativan.  He is somewhat somnolent now. ?-Discontinue CIWA monitoring ?-Use Ativan as needed for anxiety ?-Continue vitamins. ?

## 2021-06-03 ENCOUNTER — Inpatient Hospital Stay (HOSPITAL_COMMUNITY): Payer: Medicare (Managed Care)

## 2021-06-03 DIAGNOSIS — A419 Sepsis, unspecified organism: Principal | ICD-10-CM

## 2021-06-03 DIAGNOSIS — R6520 Severe sepsis without septic shock: Secondary | ICD-10-CM | POA: Diagnosis not present

## 2021-06-03 LAB — PHOSPHORUS
Phosphorus: 1.9 mg/dL — ABNORMAL LOW (ref 2.5–4.6)
Phosphorus: 3.2 mg/dL (ref 2.5–4.6)

## 2021-06-03 LAB — CBC
HCT: 33.1 % — ABNORMAL LOW (ref 39.0–52.0)
Hemoglobin: 10.1 g/dL — ABNORMAL LOW (ref 13.0–17.0)
MCH: 28.7 pg (ref 26.0–34.0)
MCHC: 30.5 g/dL (ref 30.0–36.0)
MCV: 94 fL (ref 80.0–100.0)
Platelets: 148 10*3/uL — ABNORMAL LOW (ref 150–400)
RBC: 3.52 MIL/uL — ABNORMAL LOW (ref 4.22–5.81)
RDW: 17.7 % — ABNORMAL HIGH (ref 11.5–15.5)
WBC: 26.3 10*3/uL — ABNORMAL HIGH (ref 4.0–10.5)
nRBC: 0 % (ref 0.0–0.2)

## 2021-06-03 LAB — PROCALCITONIN: Procalcitonin: 7.1 ng/mL

## 2021-06-03 LAB — LEGIONELLA PNEUMOPHILA SEROGP 1 UR AG: L. pneumophila Serogp 1 Ur Ag: NEGATIVE

## 2021-06-03 LAB — GLUCOSE, CAPILLARY
Glucose-Capillary: 98 mg/dL (ref 70–99)
Glucose-Capillary: 84 mg/dL (ref 70–99)
Glucose-Capillary: 124 mg/dL — ABNORMAL HIGH (ref 70–99)
Glucose-Capillary: 114 mg/dL — ABNORMAL HIGH (ref 70–99)
Glucose-Capillary: 109 mg/dL — ABNORMAL HIGH (ref 70–99)
Glucose-Capillary: 117 mg/dL — ABNORMAL HIGH (ref 70–99)

## 2021-06-03 LAB — LIPASE, BLOOD: Lipase: 19 U/L (ref 11–51)

## 2021-06-03 LAB — PROTIME-INR
INR: 1.6 — ABNORMAL HIGH (ref 0.8–1.2)
Prothrombin Time: 18.7 seconds — ABNORMAL HIGH (ref 11.4–15.2)

## 2021-06-03 LAB — RENAL FUNCTION PANEL
Albumin: 2.5 g/dL — ABNORMAL LOW (ref 3.5–5.0)
Anion gap: 6 (ref 5–15)
BUN: 12 mg/dL (ref 6–20)
CO2: 23 mmol/L (ref 22–32)
Calcium: 7.6 mg/dL — ABNORMAL LOW (ref 8.9–10.3)
Chloride: 107 mmol/L (ref 98–111)
Creatinine, Ser: 1.2 mg/dL (ref 0.61–1.24)
GFR, Estimated: >60 mL/min (ref >60)
Glucose, Bld: 102 mg/dL — ABNORMAL HIGH (ref 70–99)
Phosphorus: 2 mg/dL — ABNORMAL LOW (ref 2.5–4.6)
Potassium: 3.7 mmol/L (ref 3.5–5.1)
Sodium: 136 mmol/L (ref 135–145)

## 2021-06-03 LAB — MAGNESIUM
Magnesium: 2 mg/dL (ref 1.7–2.4)
Magnesium: 2.1 mg/dL (ref 1.7–2.4)
Magnesium: 1.8 mg/dL (ref 1.7–2.4)

## 2021-06-03 LAB — BRAIN NATRIURETIC PEPTIDE: B Natriuretic Peptide: 517.5 pg/mL — ABNORMAL HIGH (ref 0.0–100.0)

## 2021-06-03 LAB — APTT: aPTT: 36 seconds (ref 24–36)

## 2021-06-03 LAB — LACTIC ACID, PLASMA
Lactic Acid, Venous: 2.5 mmol/L (ref 0.5–1.9)
Lactic Acid, Venous: 2.2 mmol/L (ref 0.5–1.9)

## 2021-06-03 IMAGING — DX DG CHEST 1V PORT
1 series · 1 of 1 positions shown · non-contrast
Comparison: CT chest and chest radiograph dated 1 day prior.

CLINICAL DATA: Bilateral pneumonia

EXAM:
PORTABLE CHEST 1 VIEW

[chest ap]
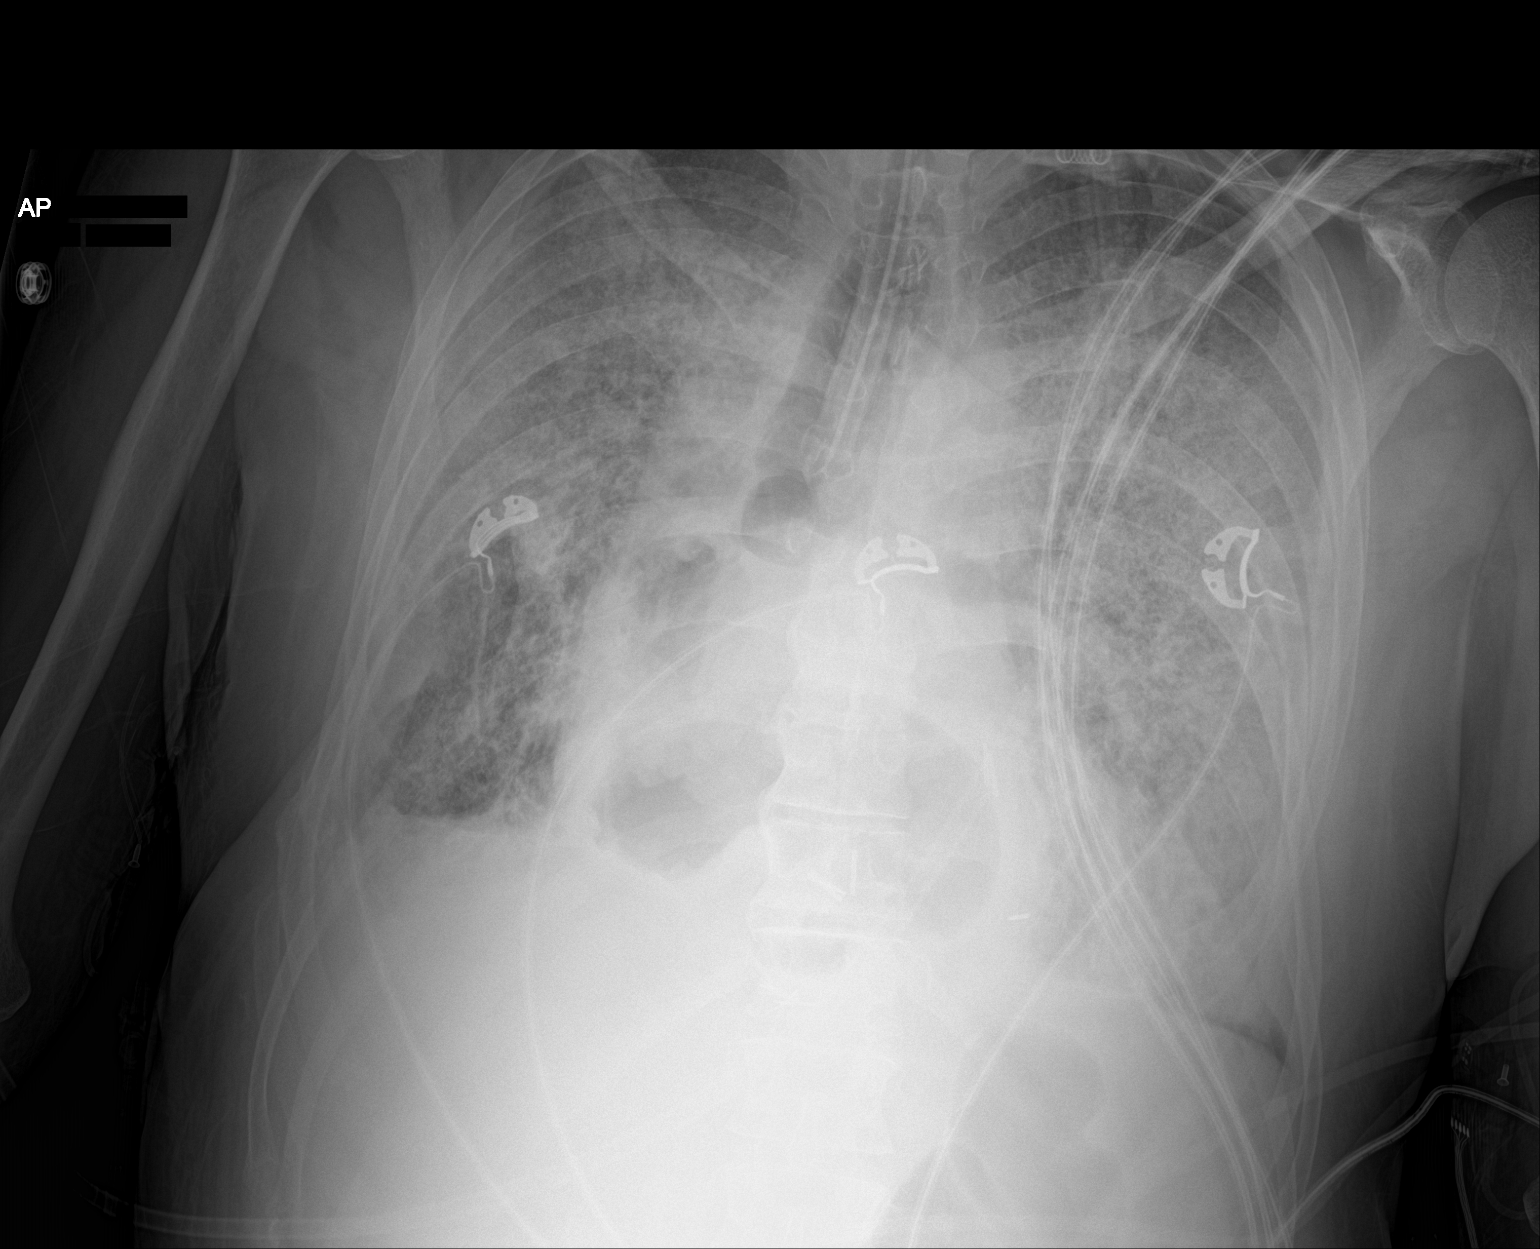

[1 of 1 positions shown; findings below may reference images not displayed]

FINDINGS: The endotracheal tube is approximately 1.8 cm from the carina. The
enteric catheter has been removed.

The cardiomediastinal silhouette is grossly stable.

Bilateral airspace opacities throughout both lungs are overall not
significantly changed since the study from 1 day prior common but
significantly worsened since the study from [DATE]. There is no
new or enlarging pleural effusion. There is no appreciable
pneumothorax. There is a large hiatal hernia.

The bones are stable.
IMPRESSION: Extensive opacities throughout both lungs are overall not
significantly changed since the study from 1 day prior common but
significantly worsened since [DATE].

## 2021-06-03 MED ORDER — PROSOURCE TF PO LIQD
90.0000 mL | Freq: Three times a day (TID) | ORAL | Status: DC
  Administered 2021-06-05 – 2021-06-12 (×20): 90 mL
  Filled 2021-06-03 (×20): qty 90

## 2021-06-03 MED ORDER — VITAL 1.5 CAL PO LIQD
1000.0000 mL | ORAL | Status: DC
Start: 2021-06-03 — End: 2021-06-13
  Administered 2021-06-05 – 2021-06-13 (×8): 1000 mL
  Filled 2021-06-03 (×6): qty 1000

## 2021-06-03 MED ORDER — VITAL HIGH PROTEIN PO LIQD: 1000.0000 mL | ORAL | Status: DC

## 2021-06-03 MED ORDER — DEXMEDETOMIDINE HCL IN NACL 400 MCG/100ML IV SOLN
0.4000 ug/kg/h | INTRAVENOUS | Status: DC
  Administered 2021-06-04 (×3): 1.1 ug/kg/h via INTRAVENOUS
  Administered 2021-06-04 – 2021-06-05 (×4): 1.2 ug/kg/h via INTRAVENOUS
  Filled 2021-06-03 (×7): qty 100

## 2021-06-03 MED ORDER — POTASSIUM PHOSPHATES 15 MMOLE/5ML IV SOLN
30.0000 mmol | Freq: Once | INTRAVENOUS | Status: AC
  Administered 2021-06-03: 30 mmol via INTRAVENOUS
  Filled 2021-06-03: qty 10

## 2021-06-03 MED ORDER — SODIUM CHLORIDE 0.9 % IV SOLN
1.0000 g | INTRAVENOUS | Status: AC
  Administered 2021-06-03 – 2021-06-09 (×7): 1 g via INTRAVENOUS
  Filled 2021-06-03 (×8): qty 10

## 2021-06-03 MED ORDER — BISACODYL 5 MG PO TBEC: 10.0000 mg | DELAYED_RELEASE_TABLET | Freq: Every day | ORAL | Status: DC | PRN

## 2021-06-03 MED ORDER — DEXMEDETOMIDINE HCL IN NACL 200 MCG/50ML IV SOLN
0.4000 ug/kg/h | INTRAVENOUS | Status: DC
  Administered 2021-06-03 (×2): 0.4 ug/kg/h via INTRAVENOUS
  Administered 2021-06-03: 0.8 ug/kg/h via INTRAVENOUS
  Filled 2021-06-03 (×3): qty 50

## 2021-06-03 MED ORDER — FREE WATER
100.0000 mL | Status: DC
  Administered 2021-06-05 – 2021-06-12 (×40): 100 mL

## 2021-06-03 NOTE — Progress Notes (Signed)
Initial Nutrition Assessment ? ?DOCUMENTATION CODES:  ? ?Not applicable ? ?INTERVENTION:  ?- once OGT/small bore NGT able to be placed and placement confirmed: ? ?- Vital 1.5 @ 20 ml/hr to advance by 10 ml every 24 hours to reach goal rate of 40 ml/hr with 90 ml Prosource TF TID and 100 ml free water every 4 hours. ? ?- at goal rate, this regimen will provide 1680 kcal, 131 grams protein, and 1331 ml free water. ? ? ?NUTRITION DIAGNOSIS:  ? ?Inadequate oral intake related to inability to eat as evidenced by NPO status. ? ?GOAL:  ? ?Patient will meet greater than or equal to 90% of their needs ? ?MONITOR:  ? ?Vent status, TF tolerance, Weight trends, Labs, I & O's ? ?REASON FOR ASSESSMENT:  ? ?Ventilator, Consult ?Enteral/tube feeding initiation and management ? ?ASSESSMENT:  ? ?61 y.o. male with medical history of HTN, asthma, GERD, anxiety, hx of alcohol abuse, and Barrett's esophagus s/p esophagectomy with gastric pull through. He presented to the ED due to dyspnea following a colonoscopy the day prior. He was also experiencing abdominal pain after the procedure. He was admitted with severe sepsis due to aspiration pneumonia. ? ?Patient discussed in rounds this AM. Able to talk with RN and CCM NP at bedside and discussed options for feeding tube placement for short-term use.  ? ?Patient remains intubated over the past 24 hours. No OGT/NGT in place; unsuccessful attempts 2/2 large hiatal hernia.  ? ?No visitors present at the time of RD visit. He was last seen by a [place] RD on 07/26/20. At that time he was eating soft foods in small amounts frequently d/t early satiety.  ? ?During that admission he met criteria for severe malnutrition in the context of chronic illness as evidenced by severe fat depletions and severe muscle depletions.  ? ?Based on NFPE findings and weight gain, patient does not currently meet criteria for malnutrition.  ? ?Weight today is 135 lb and on 06/09/20 was the same.  ? ? ?Patient is  currently intubated on ventilator support ?MV: 10.7 L/min ?Temp (24hrs), Avg:98.7 ?F (37.1 ?C), Min:98.2 ?F (36.8 ?C), Max:99.2 ?F (37.3 ?C) ?Propofol: none ?BP: 116/65 and MAP: 79 ? ?Labs reviewed; CBGs: 98, 84, 124 mg/dl, Ca: 7.6 mg/dl, Phos: 2 mg/dl. ? ?Medications reviewed; 100 mg colace BID, 1 mg folvite/day, 1 tablet multivitamin with minerals/day, 40 mg IV protonix BID, 17 g miralax/day, 10 mEq IV KCl x4 runs 4/2, 100 mg IV thiamine/day.  ? ?Drips; precedex @ 0.4 mcg/kg/hr, levo @ 8 mcg/min, fentanyl @ 100 mcg/hr. ? ?IVF; D5-NS-20 mEq IV KCl @ 125 ml/hr (510 kcal/24 hrs). ?  ? ?NUTRITION - FOCUSED PHYSICAL EXAM: ? ?Flowsheet Row Most Recent Value  ?Orbital Region Unable to assess  [ETT holder]  ?Upper Arm Region No depletion  ?Thoracic and Lumbar Region Unable to assess  ?Buccal Region Unable to assess  [ETT holder]  ?Temple Region No depletion  ?Clavicle Bone Region No depletion  ?Clavicle and Acromion Bone Region Mild depletion  ?Scapular Bone Region No depletion  ?Dorsal Hand No depletion  ?Patellar Region No depletion  ?Anterior Thigh Region Mild depletion  ?Posterior Calf Region No depletion  ?Edema (RD Assessment) Mild  [BLE]  ?Hair Reviewed  ?Eyes Unable to assess  ?Mouth Unable to assess  ?Skin Reviewed  ?Nails Reviewed  ? ?  ? ? ?Diet Order:   ?Diet Order   ? ?       ?  Diet NPO time  specified  Diet effective now       ?  ? ?  ?  ? ?  ? ? ?EDUCATION NEEDS:  ? ?No education needs have been identified at this time ? ?Skin:  Skin Assessment: Reviewed RN Assessment ? ?Last BM:  4/2 (type 7, medium amount) ? ?Height:  ? ?Ht Readings from Last 1 Encounters:  ?06/02/21 '5\' 4"'  (1.626 m)  ? ? ?Weight:  ? ?Wt Readings from Last 1 Encounters:  ?06/03/21 61.4 kg  ? ? ? ?BMI:  Body mass index is 23.23 kg/m?. ? ?Estimated Nutritional Needs:  ?Kcal:  1640 kcal ?Protein:  92-123 grams ?Fluid:  >/= 2.2 L/day ? ? ? ? ?Jarome Matin, MS, RD, LDN ?Registered Dietitian II ?Inpatient Clinical Nutrition ?RD pager # and  on-call/weekend pager # available in [place]  ? ?

## 2021-06-03 NOTE — Progress Notes (Addendum)
[facility] ?Progress Note ? ?   ?Subjective: ?CC-  ?Patient with worsening respiratory failure yesterday requiring intubation. He is on 8 of levo. ? ?Objective: ?Vital signs in last 24 hours: ?Temp:  [98.2 ?F (36.8 ?C)-99.2 ?F (37.3 ?C)] 98.6 ?F (37 ?C) (04/03 [id number]) ?Pulse Rate:  [46-156] 133 (04/03 0700) ?Resp:  [15-48] 19 (04/03 0700) ?BP: (71-212)/(40-134) 101/68 (04/03 0700) ?SpO2:  [82 %-100 %] 95 % (04/03 0827) ?FiO2 (%):  [40 %-100 %] 55 % (04/03 0827) ?Weight:  [61.4 kg] 61.4 kg (04/03 0500) ?Last BM Date : 06/01/21 ? ?Intake/Output from previous day: ?04/02 0701 - 04/03 0700 ?In: 4160.2 [I.V.:3063.6; IV Piggyback:1096.6] ?Out: 750 [Urine:750] ?Intake/Output this shift: ?No intake/output data recorded. ? ?PE: ?Gen:  Sedated on the vent ?Abd: nondistended and soft ? ?Lab Results:  ?Recent Labs  ?  06/02/21 ?0309 06/03/21 ?[id number]  ?WBC 23.3* 26.3*  ?HGB 13.3 10.1*  ?HCT 41.2 33.1*  ?PLT 107* 148*  ? ?BMET ?Recent Labs  ?  06/02/21 ?0309 06/03/21 ?[id number]  ?NA 134* 136  ?K 3.3* 3.7  ?CL 96* 107  ?CO2 29 23  ?GLUCOSE 85 102*  ?BUN 11 12  ?CREATININE 1.36* 1.20  ?CALCIUM 8.1* 7.6*  ? ?PT/INR ?Recent Labs  ?  06/03/21 ?[id number]  ?LABPROT 18.7*  ?INR 1.6*  ? ?CMP  ?   ?Component Value Date/Time  ? NA 136 06/03/2021 0528  ? K 3.7 06/03/2021 0528  ? CL 107 06/03/2021 0528  ? CO2 23 06/03/2021 0528  ? GLUCOSE 102 (H) 06/03/2021 0528  ? BUN 12 06/03/2021 0528  ? CREATININE 1.20 06/03/2021 0528  ? CALCIUM 7.6 (L) 06/03/2021 0528  ? PROT 6.2 (L) 06/02/2021 0309  ? ALBUMIN 2.5 (L) 06/03/2021 0528  ? AST 20 06/02/2021 0309  ? ALT 7 06/02/2021 0309  ? ALKPHOS 66 06/02/2021 0309  ? BILITOT 0.6 06/02/2021 0309  ? GFRNONAA >60 06/03/2021 0528  ? ?Lipase  ?   ?Component Value Date/Time  ? LIPASE 19 06/03/2021 0528  ? ? ? ? ? ?Studies/Results: ?Portable Chest xray ? ?Result Date: 06/03/2021 ?CLINICAL DATA:  Bilateral pneumonia EXAM: PORTABLE CHEST 1 VIEW COMPARISON:  CT chest and chest radiograph dated 1 day prior. FINDINGS:  The endotracheal tube is approximately 1.8 cm from the carina. The enteric catheter has been removed. The cardiomediastinal silhouette is grossly stable. Bilateral airspace opacities throughout both lungs are overall not significantly changed since the study from 1 day prior common but significantly worsened since the study from 05/11/2021. There is no new or enlarging pleural effusion. There is no appreciable pneumothorax. There is a large hiatal hernia. The bones are stable. IMPRESSION: Extensive opacities throughout both lungs are overall not significantly changed since the study from 1 day prior common but significantly worsened since 05/08/2021. Electronically Signed   By: Valetta Mole M.D.   On: 06/03/2021 08:04  ? ?Portable Chest x-ray ? ?Result Date: 06/02/2021 ?CLINICAL DATA:  ET and OG tube placement. EXAM: PORTABLE CHEST 1 VIEW COMPARISON:  06/01/2021 FINDINGS: 1331 hours. Endotracheal tube tip is 1.7 cm above the base of the carina. NG tube tip courses to the left of the tracheal air stripe before deviating to the right of the mediastinum. Chest CT of 05/01/2021 documents a large right-sided hiatal hernia. There is diffuse bilateral airspace disease, clearly progressive on the left and also progressive in the upper right lung. Heart size stable. Bones are demineralized. IMPRESSION: 1. Endotracheal tube tip 1.7 cm above the base of the carina.  2. Course of the OG tube in the upper chest lies outside the tracheal air stripe consistent with esophageal placement and tip probably in the region of the esophagogastric junction given the patient's known large right-sided hiatal hernia. 3. Asymmetric bilateral airspace disease, progressive bilaterally. Electronically Signed   By: Misty Stanley M.D.   On: 06/02/2021 13:44  ? ?ECHOCARDIOGRAM COMPLETE ? ?Result Date: 06/02/2021 ?   ECHOCARDIOGRAM REPORT   Patient Name:   LANDIN ENGQUIST Date of Exam: 06/02/2021 Medical Rec #:  [id number]       Height:       64.0 in  Accession #:    [id number]      Weight:       135.1 lb Date of Birth:  1960/05/07        BSA:          1.656 m? Patient Age:    61 years        BP:           112/78 mmHg Patient Gender: M               HR:           135 bpm. Exam Location:  Inpatient Procedure: 2D Echo, Cardiac Doppler and Color Doppler Indications:    Respiratory distress  History:        Patient has no prior history of Echocardiogram examinations.                 Risk Factors:Hypertension.  Sonographer:    Jyl Heinz Referring Phys: [id number] Charlesetta Ivory GONFA  Sonographer Comments: Image acquisition challenging due to respiratory motion. IMPRESSIONS  1. Left ventricular ejection fraction, by estimation, is 55 to 60%. The left ventricle has normal function. The left ventricle has no regional wall motion abnormalities. There is mild left ventricular hypertrophy. Left ventricular diastolic parameters are consistent with Grade I diastolic dysfunction (impaired relaxation).  2. Right ventricular systolic function is normal. The right ventricular size is normal.  3. The mitral valve is normal in structure. Trivial mitral valve regurgitation. No evidence of mitral stenosis.  4. The aortic valve is tricuspid. Aortic valve regurgitation is trivial. No aortic stenosis is present.  5. The inferior vena cava is normal in size with greater than 50% respiratory variability, suggesting right atrial pressure of 3 mmHg. Comparison(s): No prior Echocardiogram. FINDINGS  Left Ventricle: Left ventricular ejection fraction, by estimation, is 55 to 60%. The left ventricle has normal function. The left ventricle has no regional wall motion abnormalities. The left ventricular internal cavity size was normal in size. There is  mild left ventricular hypertrophy. Left ventricular diastolic parameters are consistent with Grade I diastolic dysfunction (impaired relaxation). Right Ventricle: The right ventricular size is normal. Right ventricular systolic function is normal. Left  Atrium: Left atrial size was normal in size. Right Atrium: Right atrial size was normal in size. Pericardium: There is no evidence of pericardial effusion. Mitral Valve: The mitral valve is normal in structure. Mild mitral annular calcification. Trivial mitral valve regurgitation. No evidence of mitral valve stenosis. Tricuspid Valve: The tricuspid valve is normal in structure. Tricuspid valve regurgitation is trivial. No evidence of tricuspid stenosis. Aortic Valve: The aortic valve is tricuspid. Aortic valve regurgitation is trivial. Aortic regurgitation PHT measures 547 msec. No aortic stenosis is present. Aortic valve peak gradient measures 8.4 mmHg. Pulmonic Valve: The pulmonic valve was normal in structure. Pulmonic valve regurgitation is not visualized. No evidence of pulmonic stenosis. Aorta: The aortic  root is normal in size and structure. Venous: The inferior vena cava is normal in size with greater than 50% respiratory variability, suggesting right atrial pressure of 3 mmHg. IAS/Shunts: No atrial level shunt detected by color flow Doppler.  LEFT VENTRICLE PLAX 2D LVIDd:         4.30 cm     Diastology LVIDs:         3.00 cm     LV e' medial:    6.96 cm/s LV PW:         1.10 cm     LV E/e' medial:  16.1 LV IVS:        1.20 cm     LV e' lateral:   9.36 cm/s LVOT diam:     2.00 cm     LV E/e' lateral: 12.0 LV SV:         59 LV SV Index:   36 LVOT Area:     3.14 cm?  LV Volumes (MOD) LV vol d, MOD A2C: 76.0 ml LV vol d, MOD A4C: 92.0 ml LV vol s, MOD A2C: 34.0 ml LV vol s, MOD A4C: 39.4 ml LV SV MOD A2C:     42.0 ml LV SV MOD A4C:     92.0 ml LV SV MOD BP:      49.3 ml RIGHT VENTRICLE             IVC RV Basal diam:  3.40 cm     IVC diam: 1.60 cm RV Mid diam:    2.00 cm RV S prime:     16.00 cm/s TAPSE (M-mode): 1.8 cm LEFT ATRIUM             Index        RIGHT ATRIUM          Index LA diam:        3.10 cm 1.87 cm/m?   RA Area:     9.86 cm? LA Vol (A2C):   42.5 ml 25.66 ml/m?  RA Volume:   21.80 ml 13.16 ml/m?  LA Vol (A4C):   41.1 ml 24.82 ml/m? LA Biplane Vol: 44.8 ml 27.05 ml/m?  AORTIC VALVE AV Area (Vmax): 3.10 cm? AV Vmax:        145.00 cm/s AV Peak Grad:   8.4 mmHg LVOT Vmax:      143.00 cm/s LVOT Vmean:     9

## 2021-06-03 NOTE — Progress Notes (Signed)
Request for IR to place NG/ Core track feeding tube. D/t pt complex GI hx, surgery, anatomy and concern for possible stenosis/obstruction at the pylorus, Dr. Archer Asa recommends GI attempting endoscopically first to allow dx and possible treatment of obstruction. Dr. Archer Asa states if GI is unable to place tube, IR can attempt to wire past area and place into proximal small bowel. Dr. Archer Asa discussed these options with Selmer Dominion, NP.  ? ? ?Alex Gardener, AGNP-BC ?06/03/2021, 3:29 PM ? ? ?

## 2021-06-03 NOTE — Consult Note (Signed)
? ?NAME:  Colin Hamilton, MRN:  [id number], DOB:  Oct 08, 1960, LOS: 3 ?ADMISSION DATE:  05/19/2021, CONSULTATION DATE:  06/03/2021 ? ?REFERRING MD:  Cyndia Skeeters, TRH , CHIEF COMPLAINT:  resp distress  ? ?History of Present Illness:  ?61 year old admitted 3/30 with shortness of breath and abdominal pain -1 day after colonoscopy- with lactic acidosis, chest x-ray showing bilateral asymmetric airspace disease, presumptive diagnosis of aspiration pneumonia with sepsis ?He was treated with empiric cefepime/vancomycin , BiPAP for work of breathing ?Seen by general surgery for abdominal pain ? ?CT chest/abdomen/pelvis showed multifocal pneumonia, large hiatal hernia and an area of enhancement around the right bladder wall ?PCCM consulted on 4/2 for increased work of breathing, tachycardia and encephalopathy ?Consultants: GI, general surgery ?I have reviewed ED, initial H&P and above consultants notes ? ?Pertinent  Medical History  ?Barrett's esophagus ?Esophagectomy with gastric pull-through ?Solitary right kidney , CKD stage III yea ?Alcohol use ?afib ? ?Significant Hospital Events: ?Including procedures, antibiotic start and stop dates in addition to other pertinent events   ?3/30 CT chest/abdomen/pelvis -nodular infiltrates especially in left lung, large hiatal hernia versus gastric polyp, sigmoid diverticulosis, enhancement along right lateral bladder wall, started on vanc, flagyl, cefepime ?3/31 Admitted to [facility] ?4/1 abx changed to azithro, ceftriaxone, flagyl  ?4/2 ctx changed to cefepime ?4/3 PCCM consult, worsening resp distress> intubated, pressors  ? ? ?3/30 SARS/ flu > neg ?3/31 MRSA PCR > neg ?3/30 UC > < 10k colonies, insignificant growth ?3/30 Bcx2 > ngtd ?3/31 urine strep> neg ?3/31 urine Legionella >  ?4/2 trach asp > ? ?Interim History / Subjective:  ?Continues to be tachycardic 130-150's on fentanyl gtt ?Remains on 50%/ 8 peep ?Weaning peripheral NE ? ?Objective   ?Blood pressure (!) 101/47, pulse (!) 130,  temperature 98.6 ?F (37 ?C), temperature source Axillary, resp. rate 20, height 5\' 4"  (1.626 m), weight 61.4 kg, SpO2 100 %. ?   ?Vent Mode: PRVC ?FiO2 (%):  [40 %-100 %] 50 % ?Set Rate:  [20 bmp] 20 bmp ?Vt Set:  [470 mL] 470 mL ?PEEP:  [8 cmH20] 8 cmH20 ?Plateau Pressure:  [24 cmH20-31 cmH20] 28 cmH20  ? ?Intake/Output Summary (Last 24 hours) at 06/03/2021 1301 ?Last data filed at 06/03/2021 1142 ?Gross per 24 hour  ?Intake 5421.84 ml  ?Output 650 ml  ?Net 4771.84 ml  ? ?Filed Weights  ? 05/31/21 0900 06/02/21 0500 06/03/21 0500  ?Weight: 61.2 kg 61.3 kg 61.4 kg  ? ? ?Examination: ?Fentanyl 100 mcg/hr, precedex 0.4 ?General:  adult male sedated/ intubated on MV in NAD ?HEENT: MM pink/moist, R pupils 2/reactive, ETT, no OGT ?Neuro: will open eyes to verbal and follow intermittent commands in all extremities  ?CV: rr, ST, no murmur ?PULM:  non labored on MV, diffuse bibasilar rales R> L, no wheeze, minimal thick tan secretions ?GI: soft, hypoBS, no foley  ?Extremities: warm/dry, no LE edema  ?Skin: no rashes  ? ?UOP 750 plus 2 unmeasured UO, 1 stool  ?Net +4.5L (somewhat inaccurate given I/Os)  ?Wts 55.3> 61> 61.4 ? ?Labs: K 3.7, sCr 1.36> 1.2, WBC 23> 26k, plts 107> 148, Hgb 10.1, phos 2, Mag 2 ?CXR > stable ETT, unchanged bilateral infiltrates  ? ?Resolved Hospital Problem list   ? ? ?Assessment & Plan:  ?Acute hypoxic respiratory failure ?Aspiration pneumonia/ pneumonitis  ?- Continue MV support, 4-8cc/kg IBW with goal Pplat <30 and DP<15  ?- VAP prevention protocol/ PPI ?- PAD protocol for sedation> fentanyl gtt, adding precedex for RASS goal 0/-1 ?- wean  PEEP/ FiO2 as able for SpO2 >92%  ?- daily SAT & SBT- does not meet criteria today  ?- pending trach asp/ urine legionella ?- continue azithro/ cefepime/ flagyl >  change to ceftriaxone for additional 5 days ?-  continue PPI BID ?- will need MBS post extubation given large hiatal hernia/ further aspiration risk  ?- consider lasix  ? ?Shock> presumed septic from  aspiration pneumonia +/- sedation needs ?- TTE 4/2 > EF 55-60%, no wall motion abnormalities, G1DD, normal RV ?- continue to wean peripheral NE for MAP goal > 65 (cuff pressures have been in his leg today).  Hold off on placing CVL for now given weaning pressors ?- follow cultures ?- abx as above  ? ? ?Likely underlying COPD versus history of asthma ?Smoker ?- Xopenex plus Atrovent nebs ?- nicotine patch prn  ? ?Acute metabolic encephalopathy ?Sedation needs for mechanical ventilation ?Hx of EtOH - reported use 3 weeks ago so doubt withdrawal ?Unequal pupils- prior surgery in left eye ?Hx insomnia ?- minimize sedation with PAD protocol as above ?- hold home seroquel qHS for insominia ?- empiric MVI, folate, thiamine ? ? ?CKD stage II ?Solitary right kidney ?- cont to bladder scan as needed ?- continue MIVF w/ KCL for now until able to start Tfs ?- kphos  ?- strict I/Os ?- serial renal indices ?- Replace electrolytes as indicated, keep Mag > 2, K ideally > 4 ?- Avoid nephrotoxic agents, ensure adequate renal perfusion ? ? ?Abdominal pain ?Large hiatal hernia ?Sigmoid diverticulosis with concern for sigmoid mass vs diverticular stricture ?- General surgery following, appreciate recs.  No acute surgical interventions at this time ?- colonoscopy last week in [place] > poor prep, solid stool noted in the cecum and the right colon and opaque thick liquid stool throughout, no frank mass noted in the colon but had edema/erythema next to the diverticulosis consistent with segmental colitis associated with diverticulosis, but no active diverticulitis. Biopsy was taken ?- will need outpt GI f/u ?- will have IR place feeding tube as it will likely be several days prior to vent liberation ? ? ?Bladder wall thickening ?- evaluation by urology/cystoscopy once acute issues resolved ? ?Best Practice (right click and "Reselect all SmartList Selections" daily)  ? ?Diet/type: NPO- IR consulted for placement of feeding tube ?DVT prophylaxis:  LMWH ?GI prophylaxis: PPI ?Lines: N/A ?Foley:  N/A ?Code Status:  full code ?Last date of multidisciplinary goals of care discussion [NA] pending 4/3.  No family at bedside.   ? ?Labs   ?CBC: ?Recent Labs  ?Lab 05/12/2021 ?1813 05/18/2021 ?1831 05/31/21 ?1048 06/01/21 ?0302 06/01/21 ?2320 06/02/21 ?0309 06/03/21 ?[id number]  ?WBC 13.2*  --  18.2* 12.6* 21.9* 23.3* 26.3*  ?NEUTROABS 11.6*  --  15.6*  --  19.5* 21.2*  --   ?HGB 13.5   < > 10.0* 9.8* 11.7* 13.3 10.1*  ?HCT 43.8   < > 31.3* 30.6* 37.2* 41.2 33.1*  ?MCV 89.9  --  89.4 89.7 90.3 87.3 94.0  ?PLT 201  --  152 129* PLATELET CLUMPS NOTED ON SMEAR, UNABLE TO ESTIMATE 107* 148*  ? < > = values in this interval not displayed.  ? ? ?Basic Metabolic Panel: ?Recent Labs  ?Lab 05/31/21 ?1048 05/31/21 ?1818 06/01/21 ?0302 06/01/21 ?2320 06/02/21 ?0309 06/03/21 ?[id number]  ?NA 134* 135 134* 135 134* 136  ?K 3.3* 3.1* 3.6 3.7 3.3* 3.7  ?CL 99 100 101 97* 96* 107  ?CO2 28 27 25 26 29 23   ?GLUCOSE 110*  98 117* 87 85 102*  ?BUN 11 11 11 11 11 12   ?CREATININE 1.27* 1.25* 1.28* 1.37* 1.36* 1.20  ?CALCIUM 7.9* 8.1* 7.9* 8.4* 8.1* 7.6*  ?MG 1.1* 2.4  --  1.7 1.6* 2.0  ?PHOS  --  2.8  --   --  2.9 2.0*  ? ?GFR: ?Estimated Creatinine Clearance: 55.5 mL/min (by C-G formula based on SCr of 1.2 mg/dL). ?Recent Labs  ?Lab 06/01/21 ?0302 06/01/21 ?0303 06/01/21 ?1012 06/01/21 ?2320 06/02/21 ?0020 06/02/21 ?0309 06/03/21 ?[id number] 06/03/21 ?0838  ?PROCALCITON  --   --  6.12  --   --  7.11 7.10  --   ?WBC 12.6*  --   --  21.9*  --  23.3* 26.3*  --   ?LATICACIDVEN  --    < > 3.1*  --  2.5* 2.1* 2.5* 2.2*  ? < > = values in this interval not displayed.  ? ? ?Liver Function Tests: ?Recent Labs  ?Lab 05/22/2021 ?1813 05/31/21 ?1048 05/31/21 ?1818 06/02/21 ?0309 06/03/21 ?[id number]  ?AST 16 17  --  20  --   ?ALT 7 8  --  7  --   ?ALKPHOS 90 50  --  66  --   ?BILITOT 0.6 0.5  --  0.6  --   ?PROT 8.6* 6.1*  --  6.2*  --   ?ALBUMIN 4.6 2.9* 2.7* 2.7* 2.5*  ? ?Recent Labs  ?Lab 05/31/21 ?1048 06/03/21 ?[id number]  ?LIPASE 19  19  ? ?Recent Labs  ?Lab 06/02/21 ?0819  ?AMMONIA 16  ? ? ?ABG ?   ?Component Value Date/Time  ? PHART 7.35 06/02/2021 1520  ? PCO2ART 51 (H) 06/02/2021 1520  ? PO2ART 218 (H) 06/02/2021 1520  ? HCO3 28.2 (H) 04

## 2021-06-04 ENCOUNTER — Encounter (HOSPITAL_COMMUNITY): Payer: Self-pay | Admitting: Certified Registered"

## 2021-06-04 ENCOUNTER — Inpatient Hospital Stay: Payer: Self-pay

## 2021-06-04 ENCOUNTER — Inpatient Hospital Stay (HOSPITAL_COMMUNITY): Payer: Medicare (Managed Care)

## 2021-06-04 DIAGNOSIS — A419 Sepsis, unspecified organism: Secondary | ICD-10-CM | POA: Diagnosis not present

## 2021-06-04 DIAGNOSIS — R6520 Severe sepsis without septic shock: Secondary | ICD-10-CM | POA: Diagnosis not present

## 2021-06-04 LAB — BLOOD GAS, ARTERIAL
Acid-base deficit: 1.6 mmol/L (ref 0.0–2.0)
Bicarbonate: 25.4 mmol/L (ref 20.0–28.0)
FIO2: 40 %
MECHVT: 360 mL
O2 Saturation: 98.3 %
PEEP: 8 cmH2O
Patient temperature: 37
RATE: 28 resp/min
pCO2 arterial: 54 mmHg — ABNORMAL HIGH (ref 32–48)
pH, Arterial: 7.28 — ABNORMAL LOW (ref 7.35–7.45)
pO2, Arterial: 79 mmHg — ABNORMAL LOW (ref 83–108)

## 2021-06-04 LAB — GLUCOSE, CAPILLARY
Glucose-Capillary: 123 mg/dL — ABNORMAL HIGH (ref 70–99)
Glucose-Capillary: 130 mg/dL — ABNORMAL HIGH (ref 70–99)
Glucose-Capillary: 103 mg/dL — ABNORMAL HIGH (ref 70–99)
Glucose-Capillary: 135 mg/dL — ABNORMAL HIGH (ref 70–99)

## 2021-06-04 LAB — PHOSPHORUS
Phosphorus: 3.6 mg/dL (ref 2.5–4.6)
Phosphorus: 3.7 mg/dL (ref 2.5–4.6)

## 2021-06-04 LAB — CBC
HCT: 28.6 % — ABNORMAL LOW (ref 39.0–52.0)
Hemoglobin: 8.9 g/dL — ABNORMAL LOW (ref 13.0–17.0)
MCH: 28.6 pg (ref 26.0–34.0)
MCHC: 31.1 g/dL (ref 30.0–36.0)
MCV: 92 fL (ref 80.0–100.0)
Platelets: 143 10*3/uL — ABNORMAL LOW (ref 150–400)
RBC: 3.11 MIL/uL — ABNORMAL LOW (ref 4.22–5.81)
RDW: 17.5 % — ABNORMAL HIGH (ref 11.5–15.5)
WBC: 19.3 10*3/uL — ABNORMAL HIGH (ref 4.0–10.5)
nRBC: 0 % (ref 0.0–0.2)

## 2021-06-04 LAB — CULTURE, BLOOD (ROUTINE X 2)
Culture: NO GROWTH
Culture: NO GROWTH
Special Requests: ADEQUATE
Special Requests: ADEQUATE

## 2021-06-04 LAB — BASIC METABOLIC PANEL
Anion gap: 5 (ref 5–15)
BUN: 11 mg/dL (ref 6–20)
CO2: 22 mmol/L (ref 22–32)
Calcium: 7.5 mg/dL — ABNORMAL LOW (ref 8.9–10.3)
Chloride: 111 mmol/L (ref 98–111)
Creatinine, Ser: 1.11 mg/dL (ref 0.61–1.24)
GFR, Estimated: >60 mL/min (ref >60)
Glucose, Bld: 128 mg/dL — ABNORMAL HIGH (ref 70–99)
Potassium: 4.5 mmol/L (ref 3.5–5.1)
Sodium: 138 mmol/L (ref 135–145)

## 2021-06-04 LAB — MAGNESIUM
Magnesium: 1.7 mg/dL (ref 1.7–2.4)
Magnesium: 1.9 mg/dL (ref 1.7–2.4)

## 2021-06-04 IMAGING — DX DG CHEST 1V PORT
1 series · 1 of 1 positions shown · non-contrast
Comparison: Portable chest yesterday at [DATE] a.m.

CLINICAL DATA: Check ETT placement.

EXAM:
PORTABLE CHEST 1 VIEW

[chest ap]
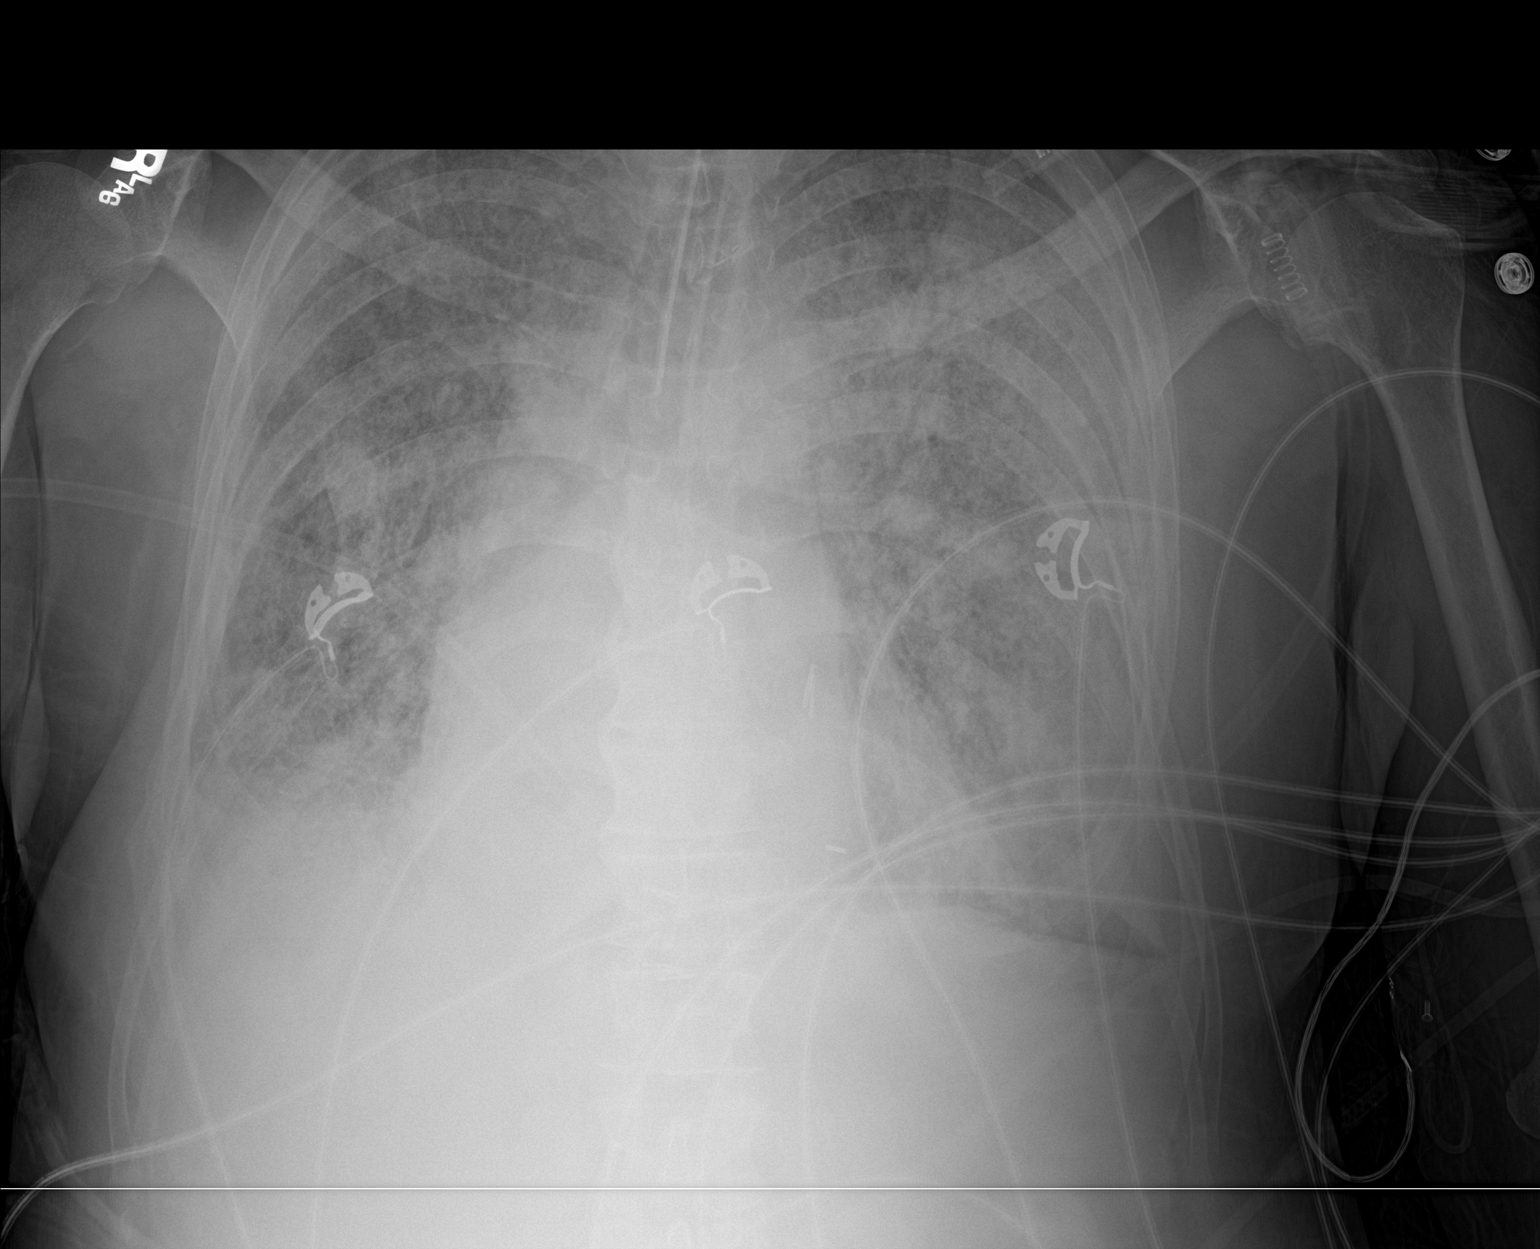

[1 of 1 positions shown; findings below may reference images not displayed]

FINDINGS: [DATE] a.m., [DATE]. ETT tip has been advanced to within 1.5 cm
from the carina and could be withdrawn 1-2 cm for optimal placement.

There is a large hiatal hernia with stable mediastinum. Mild aortic
atherosclerosis.

Cardiac size is stable, with mild central vascular fullness. Diffuse
bilateral airspace disease persists unchanged overlying small
layering pleural effusions. There is thoracic spondylosis and
bridging enthesopathy.
IMPRESSION: 1. ETT tip is now 1.5 cm from the carina, could be withdrawn 1-2 cm
for optimal placement.
2. Bilateral diffuse airspace disease is not significantly changed.

## 2021-06-04 MED ORDER — MIDAZOLAM HCL 2 MG/2ML IJ SOLN
2.0000 mg | INTRAMUSCULAR | Status: DC | PRN
  Administered 2021-06-04 – 2021-06-10 (×13): 2 mg via INTRAVENOUS
  Filled 2021-06-04 (×14): qty 2

## 2021-06-04 MED ORDER — MAGNESIUM SULFATE 2 GM/50ML IV SOLN
2.0000 g | Freq: Once | INTRAVENOUS | Status: AC
  Administered 2021-06-04: 2 g via INTRAVENOUS
  Filled 2021-06-04: qty 50

## 2021-06-04 MED ORDER — FUROSEMIDE 10 MG/ML IJ SOLN
40.0000 mg | Freq: Once | INTRAMUSCULAR | Status: AC
  Administered 2021-06-04: 40 mg via INTRAVENOUS
  Filled 2021-06-04: qty 4

## 2021-06-04 MED ORDER — SODIUM CHLORIDE 0.9% FLUSH
10.0000 mL | Freq: Two times a day (BID) | INTRAVENOUS | Status: DC
  Administered 2021-06-04 – 2021-06-05 (×2): 10 mL
  Administered 2021-06-05: 40 mL
  Administered 2021-06-06 – 2021-06-11 (×9): 10 mL
  Administered 2021-06-12: 20 mL
  Administered 2021-06-13: 10 mL

## 2021-06-04 MED ORDER — SODIUM CHLORIDE 0.9% FLUSH: 10.0000 mL | INTRAVENOUS | Status: DC | PRN

## 2021-06-04 NOTE — Progress Notes (Addendum)
[facility] ADULT ICU REPLACEMENT PROTOCOL ? ? ?The patient does apply for the [facility] Adult ICU Electrolyte Replacment Protocol based on the criteria listed below:  ? ?1.Exclusion criteria: TCTS patients, ECMO patients, and Dialysis patients ?2. Is GFR >/= 30 ml/min? Yes.    ?Patient's GFR today is >60 ?3. Is SCr </= 2? Yes.   ?Patient's SCr is 1.11 mg/dL ?4. Did SCr increase >/= 0.5 in 24 hours? No. ?5.Pt's weight >40kg  Yes.   ?6. Abnormal electrolyte(s): Mg 1.7  ?7. Electrolytes replaced per protocol ?8.  Call MD STAT for K+ </= 2.5, Phos </= 1, or Mag </= 1 ?Physician:  Oletta Darter ? ?Margaret Pyle 06/04/2021 4:34 AM  ?

## 2021-06-04 NOTE — Progress Notes (Signed)
eLink Physician-Brief Progress Note ?Patient Name: Colin Hamilton ?DOB: 1960/12/11 ?MRN: [id number] ? ? ?Date of Service ? 06/04/2021  ?HPI/Events of Note ? Nursing reports that RT hears bilateral crackles on auscultation. No increase in O2 requirement or increased WOB reported.   ?eICU Interventions ? Plan: ?Portable CXR in AM.  ? ? ? ?Intervention Category ?Major Interventions: Other: ? ?Beola Vasallo Dennard Nip ?06/04/2021, 12:43 AM ?

## 2021-06-04 NOTE — Progress Notes (Signed)
Patient ID: Colin Hamilton, male   DOB: 06/29/1960, 61 y.o.   MRN: [id number] ? ? ?Acute Care Surgery Service Progress Note:   ? ?Chief Complaint/Subjective: ?Pt ventilated ?No fever ?Still on some levo ? ?Objective: ?Vital signs in last 24 hours: ?Temp:  [96.1 ?F (35.6 ?C)-98.8 ?F (37.1 ?C)] 97.6 ?F (36.4 ?C) (04/04 0730) ?Pulse Rate:  [104-136] 110 (04/04 0900) ?Resp:  [19-31] 31 (04/04 0900) ?BP: (79-124)/(46-77) 97/64 (04/04 0900) ?SpO2:  [87 %-100 %] 95 % (04/04 0900) ?FiO2 (%):  [40 %-55 %] 40 % (04/04 0836) ?Weight:  [63.4 kg] 63.4 kg (04/04 0500) ?Last BM Date : 06/02/21 ? ?Intake/Output from previous day: ?04/03 0701 - 04/04 0700 ?In: 4074.5 [I.V.:3117.2; IV Piggyback:957.3] ?Out: -  ?Intake/Output this shift: ?Total I/O ?In: 1610.5 [I.V.:1560.5; IV Piggyback:50] ?Out: 850 [Urine:850] ? ?Lungs: intubated,  coarse BS ? ?Cardiovascular: mild tachy ? ?Abd: soft, nd, doesn't grimace ? ?Extremities: no edema, +SCDs ? ?Neuro: intubated, sedated ? ?Lab Results: ?CBC  ?Recent Labs  ?  06/03/21 ?9678 06/04/21 ?0244  ?WBC 26.3* 19.3*  ?HGB 10.1* 8.9*  ?HCT 33.1* 28.6*  ?PLT 148* 143*  ? ?BMET ?Recent Labs  ?  06/03/21 ?9381 06/04/21 ?0244  ?NA 136 138  ?K 3.7 4.5  ?CL 107 111  ?CO2 23 22  ?GLUCOSE 102* 128*  ?BUN 12 11  ?CREATININE 1.20 1.11  ?CALCIUM 7.6* 7.5*  ? ?LFT ? ?  Latest Ref Rng & Units 06/03/2021  ?  5:28 AM 06/02/2021  ?  3:09 AM 05/31/2021  ?  6:18 PM  ?Hepatic Function  ?Total Protein 6.5 - 8.1 g/dL  6.2     ?Albumin 3.5 - 5.0 g/dL 2.5   2.7   2.7    ?AST 15 - 41 U/L  20     ?ALT 0 - 44 U/L  7     ?Alk Phosphatase 38 - 126 U/L  66     ?Total Bilirubin 0.3 - 1.2 mg/dL  0.6     ? ?PT/INR ?Recent Labs  ?  06/03/21 ?0175  ?LABPROT 18.7*  ?INR 1.6*  ? ?ABG ?Recent Labs  ?  06/02/21 ?1520 06/04/21 ?0906  ?PHART 7.35 7.28*  ?HCO3 28.2* 25.4  ? ? ?Studies/Results: ? ?Anti-infectives: ?Anti-infectives (From admission, onward)  ? ? Start     Dose/Rate Route Frequency Ordered Stop  ? 06/03/21 2000  cefTRIAXone (ROCEPHIN)  1 g in sodium chloride 0.9 % 100 mL IVPB       ? 1 g ?200 mL/hr over 30 Minutes Intravenous Every 24 hours 06/03/21 1338 06/08/21 1959  ? 06/02/21 0800  ceFEPIme (MAXIPIME) 2 g in sodium chloride 0.9 % 100 mL IVPB  Status:  Discontinued       ? 2 g ?200 mL/hr over 30 Minutes Intravenous Every 12 hours 06/02/21 0704 06/03/21 1338  ? 06/01/21 1200  cefTRIAXone (ROCEPHIN) 2 g in sodium chloride 0.9 % 100 mL IVPB  Status:  Discontinued       ? 2 g ?200 mL/hr over 30 Minutes Intravenous Every 24 hours 06/01/21 0645 06/02/21 0656  ? 06/01/21 1000  azithromycin (ZITHROMAX) 500 mg in sodium chloride 0.9 % 250 mL IVPB       ? 500 mg ?250 mL/hr over 60 Minutes Intravenous Every 24 hours 06/01/21 0645 06/03/21 1147  ? 06/01/21 0800  metroNIDAZOLE (FLAGYL) IVPB 500 mg  Status:  Discontinued       ? 500 mg ?100 mL/hr over 60 Minutes Intravenous Every 12 hours  06/01/21 0645 06/03/21 1338  ? 05/31/21 1000  ceFEPIme (MAXIPIME) 2 g in sodium chloride 0.9 % 100 mL IVPB  Status:  Discontinued       ? 2 g ?200 mL/hr over 30 Minutes Intravenous Every 12 hours 05/10/2021 1906 06/01/21 0645  ? 05/31/21 1000  vancomycin (VANCOCIN) 500 mg in sodium chloride 0.9 % 100 mL IVPB  Status:  Discontinued       ? 500 mg ?100 mL/hr over 60 Minutes Intravenous Every 12 hours 05/02/2021 1906 05/31/21 0910  ? 05/31/21 1000  vancomycin (VANCOREADY) IVPB 500 mg/100 mL  Status:  Discontinued       ? 500 mg ?100 mL/hr over 60 Minutes Intravenous Every 12 hours 05/31/21 0910 06/01/21 0645  ? 05/10/2021 1845  ceFEPIme (MAXIPIME) 2 g in sodium chloride 0.9 % 100 mL IVPB       ? 2 g ?200 mL/hr over 30 Minutes Intravenous  Once 05/05/2021 1839 05/24/2021 1922  ? 05/25/2021 1845  metroNIDAZOLE (FLAGYL) IVPB 500 mg       ? 500 mg ?100 mL/hr over 60 Minutes Intravenous  Once 05/19/2021 1839 05/28/2021 2039  ? 05/02/2021 1845  vancomycin (VANCOCIN) IVPB 1000 mg/200 mL premix       ? 1,000 mg ?200 mL/hr over 60 Minutes Intravenous  Once 05/26/2021 1839 05/19/2021 2048  ? ?   ? ? ?Medications: ?Scheduled Meds: ? chlorhexidine gluconate (MEDLINE KIT)  15 mL Mouth Rinse BID  ? Chlorhexidine Gluconate Cloth  6 each Topical Daily  ? docusate  100 mg Per Tube BID  ? enoxaparin (LOVENOX) injection  40 mg Subcutaneous Q24H  ? feeding supplement (PROSource TF)  90 mL Per Tube TID  ? feeding supplement (VITAL 1.5 CAL)  1,000 mL Per Tube [id number]  ? folic acid  1 mg Oral Daily  ? free water  100 mL Per Tube Q4H  ? ipratropium  0.5 mg Nebulization Q6H  ? levalbuterol  0.63 mg Nebulization Q6H  ? mouth rinse  15 mL Mouth Rinse 10 times per day  ? multivitamin with minerals  1 tablet Oral Daily  ? nicotine  21 mg Transdermal Daily  ? pantoprazole (PROTONIX) IV  40 mg Intravenous Q12H  ? polyethylene glycol  17 g Per Tube Daily  ? thiamine  100 mg Oral Daily  ? Or  ? thiamine  100 mg Intravenous Daily  ? ?Continuous Infusions: ? sodium chloride 250 mL (06/02/21 1947)  ? cefTRIAXone (ROCEPHIN)  IV Stopped (06/03/21 2139)  ? dexmedetomidine (PRECEDEX) IV infusion 1 mcg/kg/hr (06/04/21 9604)  ? dextrose 5 % and 0.9 % NaCl with KCl 20 mEq/L 20 mL/hr at 06/04/21 0924  ? fentaNYL infusion INTRAVENOUS 200 mcg/hr (06/04/21 5409)  ? norepinephrine (LEVOPHED) Adult infusion 4 mcg/min (06/04/21 8119)  ? ?PRN Meds:.acetaminophen **OR** acetaminophen, bisacodyl, fentaNYL, levalbuterol, prochlorperazine ? ?Assessment/Plan: ?Patient Active Problem List  ? Diagnosis Date Noted  ? Acute metabolic encephalopathy 14/78/2956  ? Thrombocytopenia ([place]) 06/02/2021  ? Aspiration pneumonia ([place]) 06/01/2021  ? 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 Abnormal CT of the abdomen 06/01/2021  ? Lactic acidosis 06/01/2021  ? Hyponatremia 06/01/2021  ? Hyponatremia, hypokalemia and hypomagnesemia 06/01/2021  ? Respiratory distress 06/01/2021  ? Acute respiratory failure ([place]) 05/31/2021  ? Anxiety 05/31/2021  ? Hypomagnesemia 05/31/2021  ? Protein-calorie malnutrition, severe 07/23/2020  ? Irregular cardiac rhythm  07/21/2020  ? Mild renal insufficiency 07/21/2020  ? Severe sepsis due to aspiration pneumonia 07/21/2020  ? Abdominal pain   ?  Sepsis secondary to UTI ([place]) 06/05/2020  ? Hypokalemia 06/05/2020  ? Sinus tachycardia 06/05/2020  ? GERD with esophagitis 06/05/2020  ? History of alcohol abuse 06/05/2020  ? Essential hypertension 06/05/2020  ? Intractable nausea and vomiting 06/05/2020  ? Asthma in adult, mild intermittent, uncomplicated 03/50/0938  ? Sigmoid diverticulitis 06/05/2020  ? ?Abdominal pain ??Sigmoid mass vs diverticular stricture ?- CT scans from 3/21 and 3/30 (completed following c-scope) showing sigmoid diverticulosis and question of sigmoid mass. He does have developing adhesions to bladder. No bowel obstruction or perforation on CT 3/30 PM ?- underwent colonoscopy 3/30 in [place].  Documentation in care everywhere (full report obtained) notes poor prep, solid stool noted in the cecum and the right colon and opaque thick liquid stool throughout, no frank mass noted in the colon but had edema/erythema next to the diverticulosis consistent with segmental colitis associated with diverticulosis, but no active diverticulitis. Biopsy was taken. ?- Abdomen is soft and nondistended. No indication for acute surgical intervention. ? ? VDRF/ aspiration pneumonia per CCM. ?  ?FEN: NPO ?ID: currently rocephin/azithromycin ?VTE: lovenox ?  ?VDRF ?Aspiration pneumonia ?Barrett's esophagus ?H/o what sounds like Ivor Lewis open esophagectomy with gastric pull through 09/2018 at [facility]  ?Solitary right kidney , CKD stage III ?Tobacco abuse ?Alcohol use ?HTN ?Bladder thickening ?  ?I reviewed CCM notes, last 24 h vitals and pain scores, last 48 h intake and output, last 24 h labs and trends, and last 24 h imaging results & discussed case with CCM ? ?Disposition: ok to try some enteral trophic feeds ? ? LOS: 4 days  ? ? ?Leighton Ruff. Redmond Pulling, MD, FACS ?General, Bariatric, & Minimally Invasive Surgery ?[phone]  [phone] ?[facility] ? ?

## 2021-06-04 NOTE — Progress Notes (Signed)
Peripherally Inserted Central Catheter Placement ? ?The IV Nurse has discussed with the patient and/or persons authorized to consent for the patient, the purpose of this procedure and the potential benefits and risks involved with this procedure.  The benefits include less needle sticks, lab draws from the catheter, and the patient may be discharged home with the catheter. Risks include, but not limited to, infection, bleeding, blood clot (thrombus formation), and puncture of an artery; nerve damage and irregular heartbeat and possibility to perform a PICC exchange if needed/ordered by physician.  Alternatives to this procedure were also discussed.  Bard Power PICC patient education guide, fact sheet on infection prevention and patient information card has been provided to patient /or left at bedside.   ? ?PICC Placement Documentation  ?PICC Triple Lumen 06/04/21 Right Brachial 34 cm 0 cm (Active)  ?Indication for Insertion or Continuance of Line Vasoactive infusions 06/04/21 1542  ?Exposed Catheter (cm) 0 cm 06/04/21 1542  ?Site Assessment Clean, Dry, Intact 06/04/21 1542  ?Lumen #1 Status Flushed;Saline locked;Blood return noted 06/04/21 1542  ?Lumen #2 Status Flushed;Saline locked;Blood return noted 06/04/21 1542  ?Lumen #3 Status Flushed;Saline locked;Blood return noted 06/04/21 1542  ?Dressing Type Transparent;Securing device 06/04/21 1542  ?Dressing Status Antimicrobial disc in place;Clean, Dry, Intact 06/04/21 1542  ?Safety Lock Not Applicable 06/04/21 1542  ?Dressing Intervention New dressing;Other (Comment) 06/04/21 1542  ?Dressing Change Due 06/11/21 06/04/21 1542  ? ? ?Consent signed by patient's brother, Jan Olano, via telephone. Verified by 2 IV/PICC RNs. ? ? ? ?Annett Fabian ?06/04/2021, 3:43 PM ? ?

## 2021-06-04 NOTE — Consult Note (Signed)
Referring Provider: TRH ?Primary Care Physician:  Pcp, No ?Primary Gastroenterologist:  [facility] ? ?Reason for Consultation: NG tube placement  ? ?HPI: Colin Hamilton is a 61 y.o. male [name] with PMH of Barrett esophagus, esophagectomy with gastric pull-through, history of esophageal perforation, CKD-3A, solitary right kidney, HTN, asthma, anxiety and alcohol use disorder presenting with shortness of breath and abdominal pain, and admitted for severe sepsis due to possible aspiration pneumonia/pneumonitis and abdominal pain.  Patient had colonoscopy with digestive GI in [place] the day prior to presentation.  Was undergoing colonoscopy for bloody stools and possible abdominal mass seen on CT 05/21/2021. Post procedure desaturation to 84% noted on room air followed by neb treatment with improvement in oxygenation.  ? ?Since admission the critical care team has been unable to achieve enteric access. Consulted IR for NG tube placement due to the patients complex GI history, esophageal surgery and possible stenosis and obstruction at the pylorus IR would like to have GI attempt endoscopic NG tube placement.  ? ?CT scan 05/21/2021 revealed Diffuse sigmoid diverticulosis with no definite finding of acute diverticulitis. Persistent proximal mid sigmoid colon bowel wall thickening with query underlying infiltrative mass with similar tethering of the proximal/mid sigmoid colon and rectum with associated 0.8 x 1.2 cm pelvic soft tissue nodule. Findings concerning for malignancy. And a large hiatal hernia containing the majority of the stomach ? ?No findings concerning for GI bleed at this time.  ? ?Colonoscopy 05/02/2021  ?Poor prep ?No frank mass noted ?Edema and erythema in the descending and sigmoid colon next to diverticulosis consistent with SCAD.  ? ?EGD 08/27/2018 ?Benign-appearing esophageal stricture ?Otherwise normal esophagus, stomach, duodenum, no specimen collected.  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 ?EGD 11/2016 for upper GI bleed requiring transfusion for blood loss anemia.  Showed severely ulcerated mucosa from the GE junction to 28 cm. ?EGD 01/2017, unable to access report. ?Colonoscopy 01/2017.  Severe diverticulosis, colovesicular and multiple colocolonic fistula, colitis.  That time he received treatment for C. difficile toxin and C. difficile antigen positive stool.   ?EGD 06/2017.  For hematemesis.  Grade 4 Pan esophagitis, nodular GEJ, squamocolumnar junction appeared irregular..  Biopsies obtained. ?EGD 08/2017 for GI bleed, blood loss anemia.  Severe circumferential ulcerative esophagitis, no active bleeding, no visible vessel.  Hiatal hernia.  Coffee-ground material limited stomach views but no active bleeding.  Normal visualized duodenum. ?EGD 08/2017 for recurrent upper GI bleed.  Grade 3 esophagitis in distal esophagus.  Irregular appearing square MR columnar junction, not biopsied.  Medium sized HH.  Normal-appearing stomach and duodenal mucosa. 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 ?Right VATS 10/2017 ? ? ? ?Past Medical History:  ?Diagnosis Date  ? Asthma   ? Asthma in adult, mild intermittent, uncomplicated 03/08/1094  ? Essential hypertension 06/05/2020  ? GERD with esophagitis 06/05/2020  ? History of alcohol abuse 06/05/2020  ? ? ?History reviewed. No pertinent surgical history. ? ?Prior to Admission medications   ?Medication Sig Start Date End Date Taking? Authorizing Provider  ?albuterol (VENTOLIN HFA) 108 (90 Base) MCG/ACT inhaler Inhale 2 puffs into the lungs every 6 (six) hours as needed for wheezing or shortness of breath.   Yes [provider]  ?cyclobenzaprine (FLEXERIL) 10 MG tablet Take 1 tablet (10 mg total) by mouth 3 (three) times daily as needed for muscle spasms. 07/28/20  Yes Oswald Hillock, MD  ?docusate sodium (COLACE) 100 MG capsule Take 100 mg by mouth  2 (two) times daily as needed for mild  constipation or moderate constipation.   Yes [provider]  ?Fluticasone-Umeclidin-Vilant (TRELEGY ELLIPTA) 100-62.5-25 MCG/INH AEPB Inhale 1 puff into the lungs daily.   Yes [provider]  ?HYDROcodone-acetaminophen (NORCO) 10-325 MG tablet Take 1 tablet by mouth every 6 (six) hours as needed for moderate pain or severe pain. 05/24/21  Yes [provider]  ?metoprolol tartrate (LOPRESSOR) 25 MG tablet Take 0.5 tablets (12.5 mg total) by mouth 2 (two) times daily. ?Patient taking differently: Take 25 mg by mouth 2 (two) times daily. 07/28/20  Yes Oswald Hillock, MD  ?pantoprazole (PROTONIX) 40 MG tablet Take 40 mg by mouth 2 (two) times daily.   Yes [provider]  ?QUEtiapine (SEROQUEL) 100 MG tablet Take 100 mg by mouth at bedtime. 05/24/21  Yes [provider]  ?sucralfate (CARAFATE) 1 g tablet Take 1 g by mouth 4 (four) times daily -  with meals and at bedtime.   Yes [provider]  ?ferrous gluconate (FERGON) 324 MG tablet Take 1 tablet (324 mg total) by mouth daily with breakfast. ?Patient not taking: Reported on 06/01/2021 07/29/20   Oswald Hillock, MD  ?HYDROcodone-acetaminophen (NORCO/VICODIN) 5-325 MG tablet Take 1 tablet by mouth every 6 (six) hours as needed for moderate pain. ?Patient not taking: Reported on 06/01/2021 07/28/20   Oswald Hillock, MD  ?metoCLOPramide (REGLAN) 5 MG tablet Take 1 tablet (5 mg total) by mouth every 8 (eight) hours as needed for nausea. ?Patient taking differently: Take 5 mg by mouth 2 (two) times daily before a meal. 06/09/20 07/09/20  Ghimire, Henreitta Leber, MD  ?moxifloxacin (AVELOX) 400 MG tablet Take 400 mg by mouth daily. 05/31/21   [provider]  ? ? ?Scheduled Meds: ? chlorhexidine gluconate (MEDLINE KIT)  15 mL Mouth Rinse BID  ? Chlorhexidine Gluconate Cloth  6 each Topical Daily  ? docusate  100 mg Per Tube BID  ? enoxaparin (LOVENOX) injection  40 mg Subcutaneous Q24H  ? feeding supplement (PROSource TF)  90 mL Per  Tube TID  ? feeding supplement (VITAL 1.5 CAL)  1,000 mL Per Tube N00B  ? folic acid  1 mg Oral Daily  ? free water  100 mL Per Tube Q4H  ? ipratropium  0.5 mg Nebulization Q6H  ? levalbuterol  0.63 mg Nebulization Q6H  ? mouth rinse  15 mL Mouth Rinse 10 times per day  ? multivitamin with minerals  1 tablet Oral Daily  ? nicotine  21 mg Transdermal Daily  ? pantoprazole (PROTONIX) IV  40 mg Intravenous Q12H  ? polyethylene glycol  17 g Per Tube Daily  ? thiamine  100 mg Oral Daily  ? Or  ? thiamine  100 mg Intravenous Daily  ? ?Continuous Infusions: ? sodium chloride 250 mL (06/02/21 1947)  ? cefTRIAXone (ROCEPHIN)  IV Stopped (06/03/21 2139)  ? dexmedetomidine (PRECEDEX) IV infusion 1 mcg/kg/hr (06/04/21 1100)  ? dextrose 5 % and 0.9 % NaCl with KCl 20 mEq/L 20 mL/hr at 06/04/21 1100  ? fentaNYL infusion INTRAVENOUS 200 mcg/hr (06/04/21 1100)  ? norepinephrine (LEVOPHED) Adult infusion 4 mcg/min (06/04/21 1100)  ? ?PRN Meds:.acetaminophen **OR** acetaminophen, bisacodyl, fentaNYL, levalbuterol, prochlorperazine ? ?Allergies as of 05/10/2021 - Review Complete 05/27/2021  ?Allergen Reaction Noted  ? Penicillins Swelling 06/05/2020  ? ? ?Family History  ?Problem Relation Age of Onset  ? Cancer Sister   ? ? ?Social History  ? ?Socioeconomic History  ? Marital status:  Single  ?  Spouse name: Not on file  ? Number of children: Not on file  ? Years of education: Not on file  ? Highest education level: Not on file  ?Occupational History  ? Not on file  ?Tobacco Use  ? Smoking status: Former  ? Smokeless tobacco: Never  ?Substance and Sexual Activity  ? Alcohol use: Yes  ?  Comment: not clear how much he drinks  ? Drug use: Yes  ?  Types: Marijuana  ? Sexual activity: Not on file  ?Other Topics Concern  ? Not on file  ?Social History Narrative  ? Not on file  ? ?Social Determinants of Health  ? ?Financial Resource Strain: Not on file  ?Food Insecurity: Not on file  ?Transportation Needs: Not on file  ?Physical Activity:  Not on file  ?Stress: Not on file  ?Social Connections: Not on file  ?Intimate Partner Violence: Not on file  ? ? ?Review of Systems:Review of Systems  ?Unable to perform ROS: Intubated  ? ? ?Physical Exam:Physi

## 2021-06-04 NOTE — Progress Notes (Signed)
RT NOTE: ? ?ETT pulled back from 26 to 24 with no complications per CCM NP order.  ?

## 2021-06-04 NOTE — Progress Notes (Signed)
? ?NAME:  Colin Hamilton, MRN:  [id number], DOB:  1960/07/28, LOS: 4 ?ADMISSION DATE:  05/27/2021, CONSULTATION DATE:  06/04/2021 ? ?REFERRING MD:  Cyndia Skeeters, TRH , CHIEF COMPLAINT:  resp distress  ? ?History of Present Illness:  ?61 year old admitted 3/30 with shortness of breath and abdominal pain -1 day after colonoscopy- with lactic acidosis, chest x-ray showing bilateral asymmetric airspace disease, presumptive diagnosis of aspiration pneumonia with sepsis ?He was treated with empiric cefepime/vancomycin , BiPAP for work of breathing ?Seen by general surgery for abdominal pain ? ?CT chest/abdomen/pelvis showed multifocal pneumonia, large hiatal hernia and an area of enhancement around the right bladder wall ?PCCM consulted on 4/2 for increased work of breathing, tachycardia and encephalopathy ?Consultants: GI, general surgery ?I have reviewed ED, initial H&P and above consultants notes ? ?Pertinent  Medical History  ?Barrett's esophagus ?Esophagectomy with gastric pull-through ?Solitary right kidney , CKD stage III yea ?Alcohol use ?afib ? ?Significant Hospital Events: ?Including procedures, antibiotic start and stop dates in addition to other pertinent events   ?3/30 CT chest/abdomen/pelvis -nodular infiltrates especially in left lung, large hiatal hernia versus gastric polyp, sigmoid diverticulosis, enhancement along right lateral bladder wall, started on vanc, flagyl, cefepime ?3/31 Admitted to [facility] ?4/1 abx changed to azithro, ceftriaxone, flagyl  ?4/2 ctx changed to cefepime ?4/3 PCCM consult, worsening resp distress> intubated, pressors  ?4/4 precedex added, remains on 50%/ 8 peep ? ? ?3/30 SARS/ flu > neg ?3/31 MRSA PCR > neg ?3/30 UC > < 10k colonies, insignificant growth ?3/30 Bcx2 > ngtd ?3/31 urine strep> neg ?3/31 urine Legionella >  ?4/2 trach asp > ? ?Interim History / Subjective:  ?No events overnight.  Up on sedation due to agitation/ vent synchrony  ? ?Objective   ?Blood pressure 113/70, pulse (!) 104,  temperature (!) 96.1 ?F (35.6 ?C), temperature source Axillary, resp. rate (!) 23, height 5\' 4"  (1.626 m), weight 63.4 kg, SpO2 100 %. ?   ?Vent Mode: PRVC ?FiO2 (%):  [45 %-55 %] 50 % ?Set Rate:  [20 bmp] 20 bmp ?Vt Set:  [470 mL] 470 mL ?PEEP:  [8 cmH20] 8 cmH20 ?Plateau Pressure:  [21 [id number] cmH20] 34 cmH20  ? ?Intake/Output Summary (Last 24 hours) at 06/04/2021 0737 ?Last data filed at 06/04/2021 [id number] ?Gross per 24 hour  ?Intake 5511.84 ml  ?Output --  ?Net 5511.84 ml  ? ?Filed Weights  ? 06/02/21 0500 06/03/21 0500 06/04/21 0500  ?Weight: 61.3 kg 61.4 kg 63.4 kg  ? ?Examination: ?General:  critically ill thin adult male sedated/ intubated on MV ?HEENT: MM pink/moist, ETT, no OGT, R pupil 2, left previous surgery ?Neuro: sedated, no stimulation to noxious stimuli  ?CV: rr, ST ?PULM:  synchronous with MV, DP 27, plat 35, PIP 38 on 8cc, dropped to 7 and then 6cc with RR adjusted to match previous Mve, now PIP 31, plat 28-29, and DP 20, right clear with slight inspiratory wheeze, left diffuse rales, minimal secretions- few specks of bloody mucous in suction tubing ?GI: soft, bs+, ND, condom cath  ?Extremities: warm/dry, LE edema  ?Skin: no rashes  ? ?Afebrile  ?Unmeasured UOP overnight ?Wts 61.3> 61.4> 63.4 ? ?Labs: Mag 1.7 s/p replete, WBC 26.3> 19.3, Hgb 10.1> 8.9, stable plts 143 ?CBGs 103- 130 ? ?CXR> unchanged, slightly worse appearing bilateral infiltrates, ETT 1.5 above carina, slight bilateral effusions ? ?Resolved Hospital Problem list   ? ? ?Assessment & Plan:  ?Acute hypoxic respiratory failure ?Aspiration pneumonia/ pneumonitis  ?- Continue MV support, PRVC with goal  Pplat <30 and DP<15 > TV dropped from 8 to 6 cc/kg and rate adjusted given high Plats and driving pressures today with improvement.  Monitor for vent synchrony/ auto peeping ?- retract ETT by 2 ?- VAP prevention protocol/ PPI ?- PAD protocol for sedation> fentanyl/ precedex for RASS goal 0/-1 and vent synchrony  ?- wean PEEP/ FiO2 as able for  SpO2 >92%  ?- pending trach asp ?- negative urine strep/ legionella  ?- continue ceftriaxone for additional 4 ?-  continue PPI BID ?- will need MBS post extubation given large hiatal hernia/ further aspiration risk  ?- lasix 40mg  today, consider re-dose this afternoon depending on response and blood pressure ? ?Shock> presumed septic from aspiration pneumonia +/- sedation needs ?- TTE 4/2 > EF 55-60%, no wall motion abnormalities, G1DD, normal RV ?- continued slow wean of peripheral NE for MAP goal > 65  ?- will place PICC today  ?- follow BC> Ngtd ?- continue ceftriaxone as above ?- overall remains afebrile and improving leukocytosis  ? ?Likely underlying COPD versus history of asthma ?Smoker ?- Xopenex plus Atrovent nebs ?- nicotine patch prn  ? ?Acute metabolic encephalopathy ?Sedation needs for mechanical ventilation ?Hx of EtOH - reported use 3 weeks ago so doubt withdrawal ?Unequal pupils- prior surgery in left eye ?Hx insomnia ?- minimize sedation as able with PAD protocol as above ?- hold home seroquel qHS for insomnia- no enteral access  ?- empiric MVI, folate, thiamine IV for now, change to enteral when able  ? ?CKD stage II ?Solitary right kidney ?- cont to bladder scan as needed.  Ongoing condom cath.  Renal function remains stable ?- strict I/Os ?- stop MIVF with diurese today  ?- s/p Mag 2gm for Mag 1.7 ?- serial renal indices ?- Replace electrolytes as indicated, keep Mag > 2, K ideally > 4 ?- Avoid nephrotoxic agents, ensure adequate renal perfusion ? ?Abdominal pain ?Large hiatal hernia ?Sigmoid diverticulosis with concern for sigmoid mass vs diverticular stricture ?- General surgery following, appreciate recs.  No acute surgical interventions at this time ?- colonoscopy last week in [place] > poor prep, solid stool noted in the cecum and the right colon and opaque thick liquid stool throughout, no frank mass noted in the colon but had edema/erythema next to the diverticulosis consistent with segmental  colitis associated with diverticulosis, but no active diverticulitis. Biopsy was taken ?- spoke with IR yesterday for feeding/ OGT tube which has been attempted multiple times unsuccessfully.  Felt it would be better evaluated by GI to assess/ place given prior GI surgeries/ hx.  Have consulted [facility].   ? ?Bladder wall thickening ?- evaluation by urology/cystoscopy once acute issues resolved ? ?Normocytic anemia ?- Hgb varying over last several days, 10.1 yest> 8.9.  no evidence of bleeding.  Continue to clinically monitor  ?- trend CBC, transfuse for Hgb < 7 ? ?Best Practice (right click and "Reselect all SmartList Selections" daily)  ? ?Diet/type: NPO- no access as of now ?DVT prophylaxis: LMWH ?GI prophylaxis: PPI ?Lines: N/A ?Foley:  N/A ?Code Status:  full code ?Last date of multidisciplinary goals of care discussion [NA].  Spoke with patient's brother, Cordon Lari by phone [phone].  Reports patient currently lives w/ a roommate but all his family are in [place].  He confirmed he is not married but has 3 kids (1 biological, [place]?, and 2 adoptive> including Darrick Meigs Debroah Baller unclear of last name) [phone]).  Sister listed, Marney Setting, passed away 90yrs ago and I will remove her  from contacts.  Updated brother on events.  Legal NOK would be one of his children, family to elect a point of contact and let us know.  All questions answered at this time. ? ? ?Labs   ?CBC: ?Recent Labs  ?Lab 05/21/2021 ?1813 05/05/2021 ?1831 05/31/21 ?1048 06/01/21 ?0302 06/01/21 ?2320 06/02/21 ?0309 06/03/21 ?[id number] 06/04/21 ?0244  ?WBC 13.2*  --  18.2* 12.6* 21.9* 23.3* 26.3* 19.3*  ?NEUTROABS 11.6*  --  15.6*  --  19.5* 21.2*  --   --   ?HGB 13.5   < > 10.0* 9.8* 11.7* 13.3 10.1* 8.9*  ?HCT 43.8   < > 31.3* 30.6* 37.2* 41.2 33.1* 28.6*  ?MCV 89.9  --  89.4 89.7 90.3 87.3 94.0 92.0  ?PLT 201  --  152 129* PLATELET CLUMPS NOTED ON SMEAR, UNABLE TO ESTIMATE 107* 148* 143*  ? < > = values in this interval not displayed.  ? ? ?Basic  Metabolic Panel: ?Recent Labs  ?Lab 06/01/21 ?0302 06/01/21 ?2320 06/02/21 ?0309 06/03/21 ?[id number] 06/03/21 ?1344 06/03/21 ?1701 06/04/21 ?0244  ?NA 134* 135 134* 136  --   --  138  ?K 3.6 3.7 3.3* 3.7  --

## 2021-06-05 ENCOUNTER — Inpatient Hospital Stay (HOSPITAL_COMMUNITY): Payer: Medicare (Managed Care)

## 2021-06-05 ENCOUNTER — Encounter (HOSPITAL_COMMUNITY): Admission: EM | Disposition: E | Payer: Self-pay | Source: Home / Self Care | Attending: Pulmonary Disease

## 2021-06-05 ENCOUNTER — Inpatient Hospital Stay (HOSPITAL_COMMUNITY)
Admit: 2021-06-05 | Discharge: 2021-06-05 | Disposition: A | Discharge status: Home / Self Care | Payer: Medicare (Managed Care) | Attending: Internal Medicine | Admitting: Internal Medicine

## 2021-06-05 ENCOUNTER — Encounter (HOSPITAL_COMMUNITY): Payer: Self-pay | Admitting: Internal Medicine

## 2021-06-05 DIAGNOSIS — A419 Sepsis, unspecified organism: Secondary | ICD-10-CM | POA: Diagnosis not present

## 2021-06-05 DIAGNOSIS — R569 Unspecified convulsions: Secondary | ICD-10-CM | POA: Diagnosis not present

## 2021-06-05 DIAGNOSIS — R6520 Severe sepsis without septic shock: Secondary | ICD-10-CM | POA: Diagnosis not present

## 2021-06-05 DIAGNOSIS — G40911 Epilepsy, unspecified, intractable, with status epilepticus: Secondary | ICD-10-CM

## 2021-06-05 DIAGNOSIS — G9341 Metabolic encephalopathy: Secondary | ICD-10-CM | POA: Diagnosis not present

## 2021-06-05 HISTORY — PX: ESOPHAGOGASTRODUODENOSCOPY: SHX5428

## 2021-06-05 LAB — GLUCOSE, CAPILLARY
Glucose-Capillary: 117 mg/dL — ABNORMAL HIGH (ref 70–99)
Glucose-Capillary: 122 mg/dL — ABNORMAL HIGH (ref 70–99)
Glucose-Capillary: 91 mg/dL (ref 70–99)
Glucose-Capillary: 94 mg/dL (ref 70–99)
Glucose-Capillary: 94 mg/dL (ref 70–99)
Glucose-Capillary: 100 mg/dL — ABNORMAL HIGH (ref 70–99)

## 2021-06-05 LAB — BLOOD GAS, ARTERIAL
Acid-Base Excess: 4.4 mmol/L — ABNORMAL HIGH (ref 0.0–2.0)
Acid-base deficit: 2.5 mmol/L — ABNORMAL HIGH (ref 0.0–2.0)
Bicarbonate: 25.3 mmol/L (ref 20.0–28.0)
Bicarbonate: 30.2 mmol/L — ABNORMAL HIGH (ref 20.0–28.0)
Drawn by: 33147
Drawn by: 560031
FIO2: 50 %
MECHVT: 360 mL
MECHVT: 420 mL
O2 Content: 50 L/min
O2 Saturation: 97.8 %
O2 Saturation: 99.6 %
PEEP: 8 cmH2O
PEEP: 8 cmH2O
Patient temperature: 37
Patient temperature: 37
RATE: 30 resp/min
RATE: 26 resp/min
pCO2 arterial: 55 mmHg — ABNORMAL HIGH (ref 32–48)
pCO2 arterial: 51 mmHg — ABNORMAL HIGH (ref 32–48)
pH, Arterial: 7.27 — ABNORMAL LOW (ref 7.35–7.45)
pH, Arterial: 7.38 (ref 7.35–7.45)
pO2, Arterial: 94 mmHg (ref 83–108)
pO2, Arterial: 111 mmHg — ABNORMAL HIGH (ref 83–108)

## 2021-06-05 LAB — PROTIME-INR
INR: 1.5 — ABNORMAL HIGH (ref 0.8–1.2)
Prothrombin Time: 18 seconds — ABNORMAL HIGH (ref 11.4–15.2)

## 2021-06-05 LAB — CBC
HCT: 28.2 % — ABNORMAL LOW (ref 39.0–52.0)
Hemoglobin: 8.8 g/dL — ABNORMAL LOW (ref 13.0–17.0)
MCH: 28.7 pg (ref 26.0–34.0)
MCHC: 31.2 g/dL (ref 30.0–36.0)
MCV: 91.9 fL (ref 80.0–100.0)
Platelets: 136 10*3/uL — ABNORMAL LOW (ref 150–400)
RBC: 3.07 MIL/uL — ABNORMAL LOW (ref 4.22–5.81)
RDW: 17.3 % — ABNORMAL HIGH (ref 11.5–15.5)
WBC: 14.7 10*3/uL — ABNORMAL HIGH (ref 4.0–10.5)
nRBC: 0 % (ref 0.0–0.2)

## 2021-06-05 LAB — POTASSIUM: Potassium: 4.3 mmol/L (ref 3.5–5.1)

## 2021-06-05 LAB — BASIC METABOLIC PANEL
Anion gap: 6 (ref 5–15)
BUN: 8 mg/dL (ref 6–20)
CO2: 24 mmol/L (ref 22–32)
Calcium: 7.7 mg/dL — ABNORMAL LOW (ref 8.9–10.3)
Chloride: 107 mmol/L (ref 98–111)
Creatinine, Ser: 1.19 mg/dL (ref 0.61–1.24)
GFR, Estimated: >60 mL/min (ref >60)
Glucose, Bld: 129 mg/dL — ABNORMAL HIGH (ref 70–99)
Potassium: 5.6 mmol/L — ABNORMAL HIGH (ref 3.5–5.1)
Sodium: 137 mmol/L (ref 135–145)

## 2021-06-05 LAB — CULTURE, RESPIRATORY W GRAM STAIN

## 2021-06-05 LAB — MAGNESIUM: Magnesium: 1.7 mg/dL (ref 1.7–2.4)

## 2021-06-05 IMAGING — DX DG CHEST 1V PORT
1 series · 1 of 1 positions shown · non-contrast
Comparison: [DATE]

CLINICAL DATA: Respiratory failure.

EXAM:
PORTABLE CHEST 1 VIEW

[chest ap]
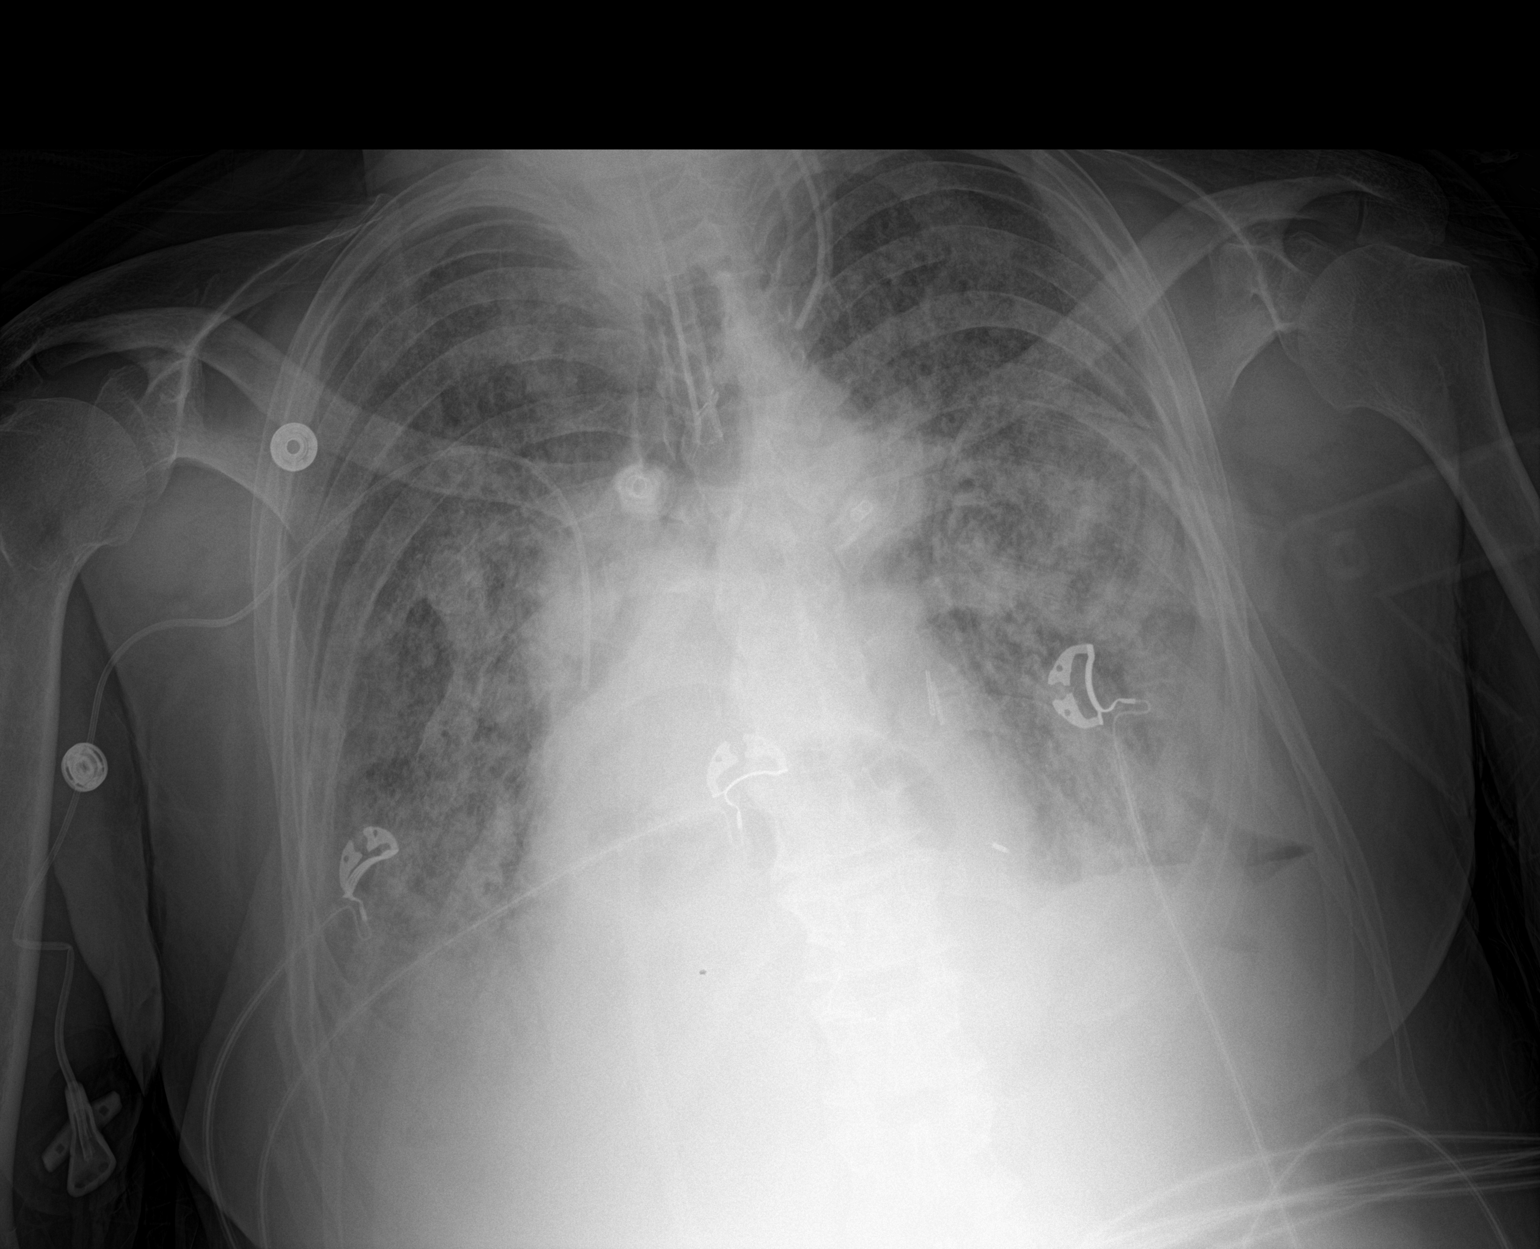

[1 of 1 positions shown; findings below may reference images not displayed]

FINDINGS: The endotracheal tube is 3 cm above the carina.

New right PICC line in good position with its tip near the
cavoatrial junction.

Persistent diffuse interstitial and airspace process in the lungs
and small bilateral pleural effusions. Stable large hiatal hernia.
IMPRESSION: 1. New right PICC line in good position.
2. Persistent diffuse interstitial and airspace process and small
effusions.

## 2021-06-05 IMAGING — MR MR HEAD W/O CM
10 series · 42 of 48 positions shown · non-contrast
Comparison: None.

CLINICAL DATA: Neuro deficit, acute, stroke suspected. Generalized
seizure.

EXAM:
MRI HEAD WITHOUT CONTRAST
TECHNIQUE: Multiplanar, multiecho pulse sequences of the brain and surrounding
structures were obtained without intravenous contrast.

[Series 6: dwi_tracew · axial · 3.0mm · 1.08mm/px · z∈[+56,+183]mm · 8 of 102 slices shown]
[im 1/102]
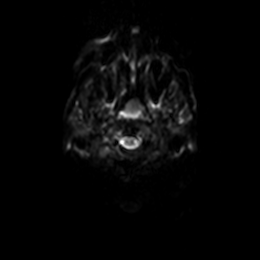
[im 19/102]
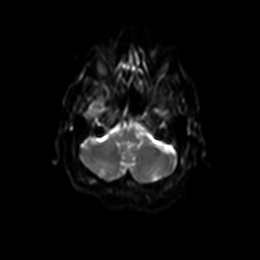
[im 28/102]
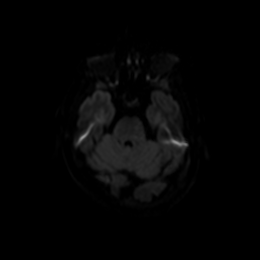
[im 46/102]
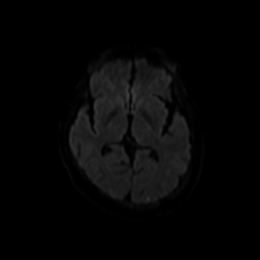
[im 56/102]
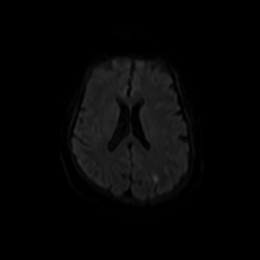
[im 74/102]
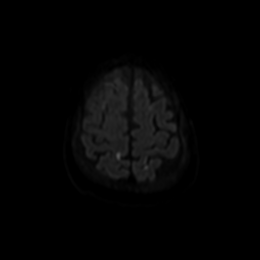
[im 83/102]
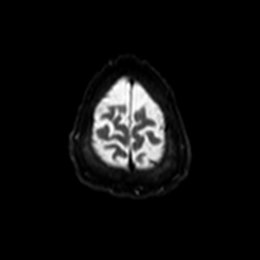
[im 102/102]
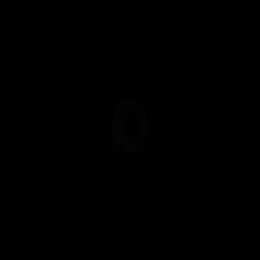

[Series 7: dwi_adc · axial · 3.0mm · 1.08mm/px · z∈[+56,+120]mm · 3 of 51 slices shown]
[im 1/51]
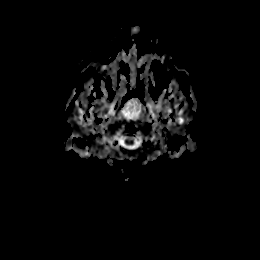
[im 13/51]
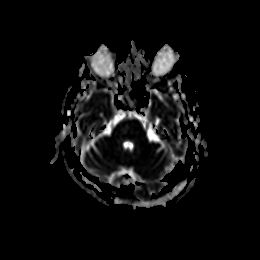
[im 26/51]
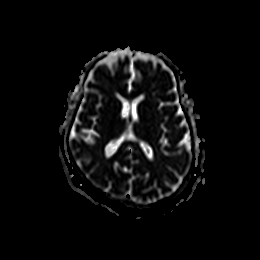

[Series 8: T2 · sagittal · 5.0mm · 0.47mm/px · 2 of 24 slices shown (1 of 3)]
[im 1/24]
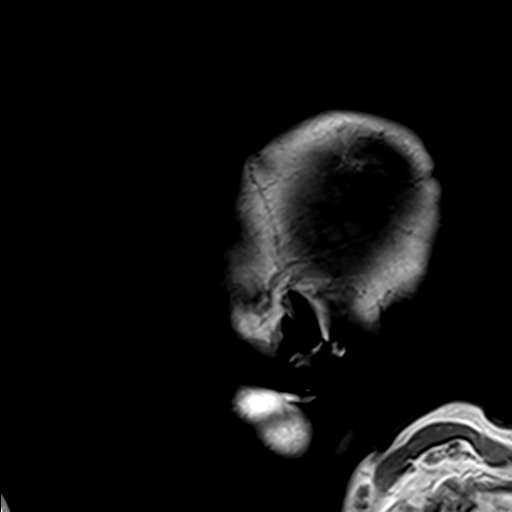
[im 24/24]
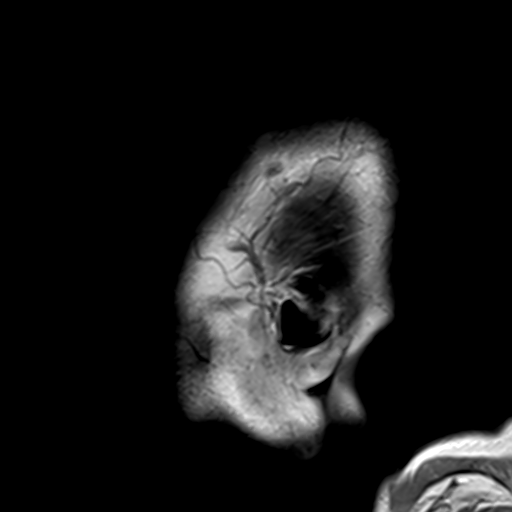

[Series 9: T2 · axial · 5.0mm · 0.45mm/px · z∈[+34,+171]mm · 3 of 26 slices shown (2 of 3)]
[im 1/26]
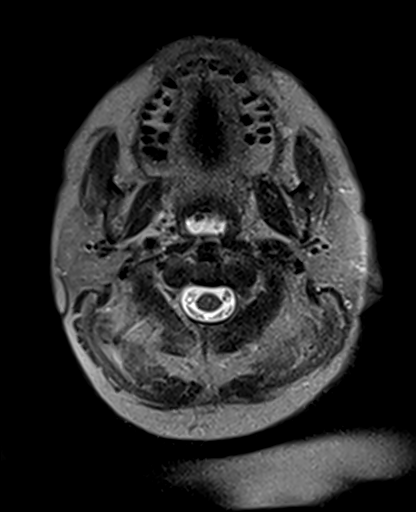
[im 13/26]
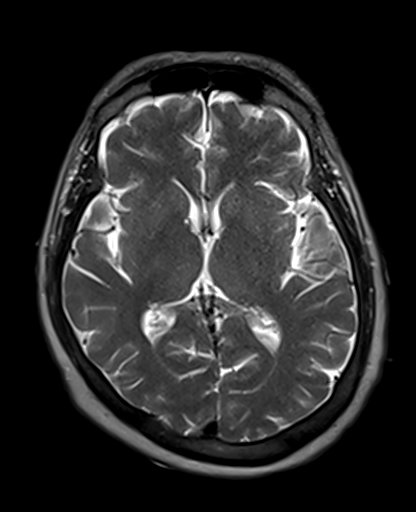
[im 26/26]
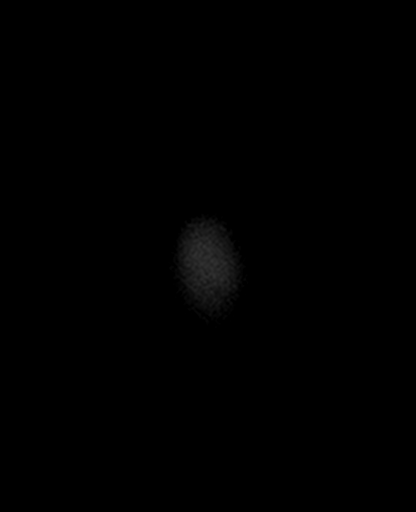

[Series 10: FLAIR · axial · 3.0mm · 0.86mm/px · z∈[+36,+163]mm · 5 of 51 slices shown]
[im 1/51]
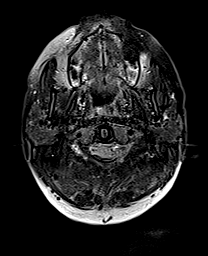
[im 13/51]
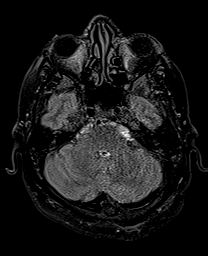
[im 26/51]
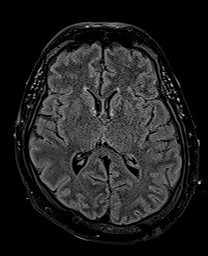
[im 38/51]
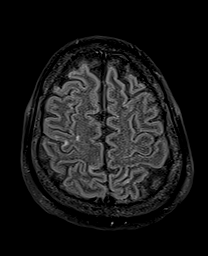
[im 51/51]
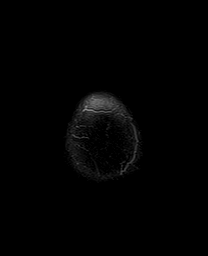

[Series 11: GRE · axial · 3.0mm · 0.45mm/px · z∈[+39,+166]mm · 5 of 51 slices shown]
[im 1/51]
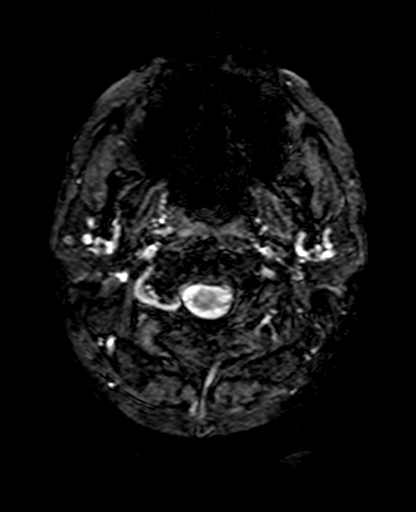
[im 13/51]
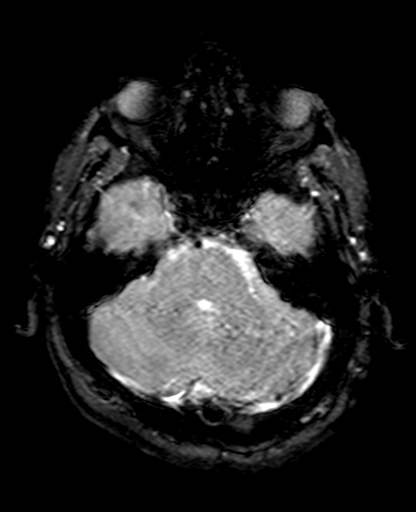
[im 26/51]
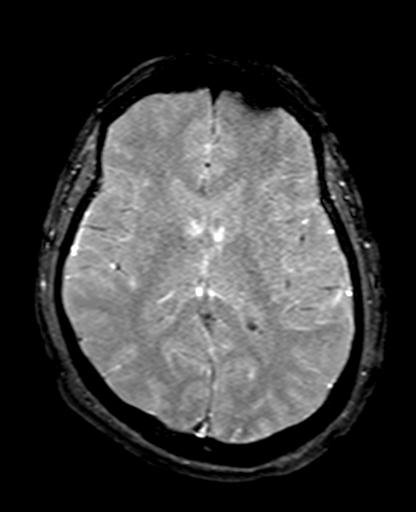
[im 38/51]
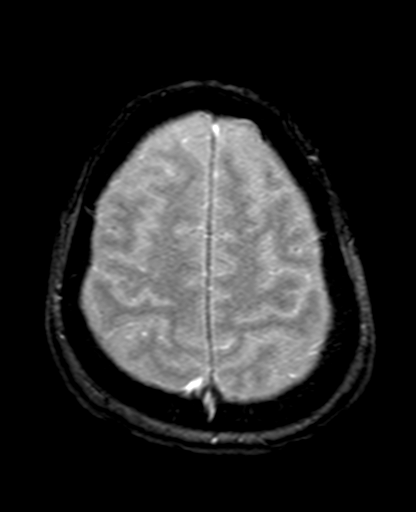
[im 51/51]
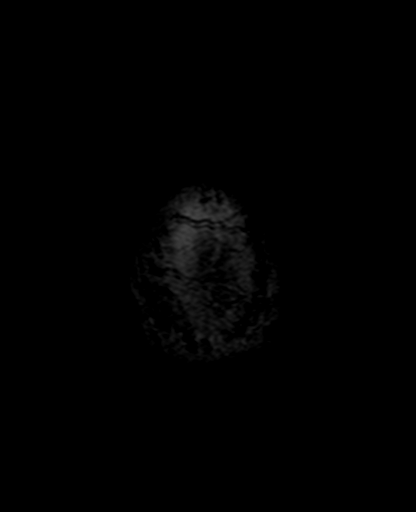

[Series 12: T1 · axial · 3.0mm · 0.45mm/px · z∈[+38,+165]mm · 5 of 51 slices shown]
[im 1/51]
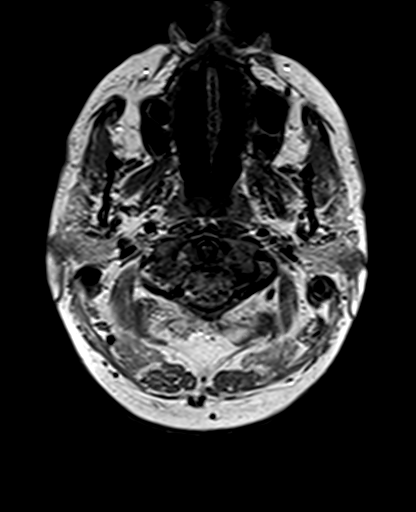
[im 13/51]
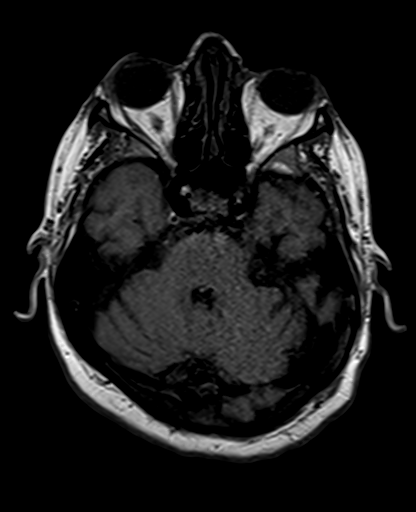
[im 26/51]
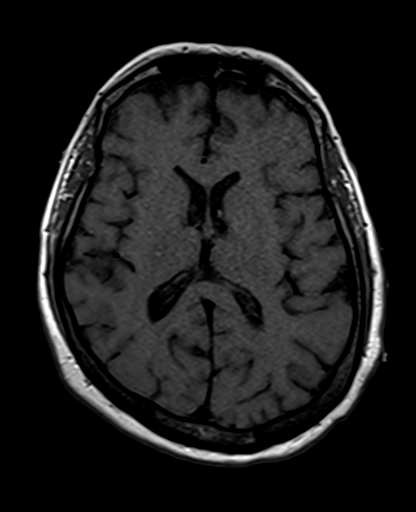
[im 38/51]
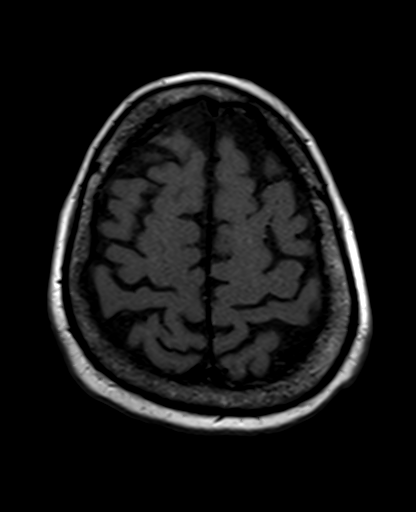
[im 51/51]
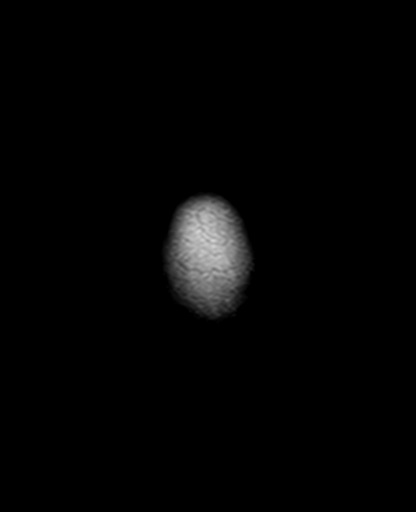

[Series 13: DWI · coronal · 5.0mm · 1.31mm/px · 5 of 52 slices shown (1 of 2)]
[im 1/52]
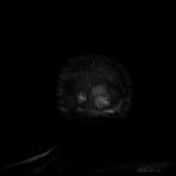
[im 13/52]
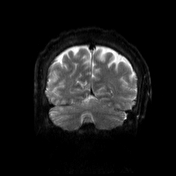
[im 26/52]
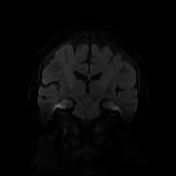
[im 39/52]
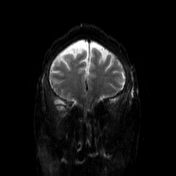
[im 52/52]
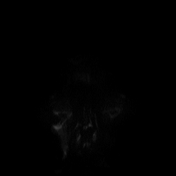

[Series 14: DWI · coronal · 5.0mm · 1.31mm/px · 3 of 26 slices shown (2 of 2)]
[im 1/26]
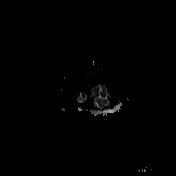
[im 13/26]
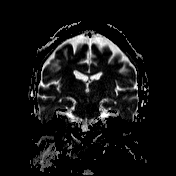
[im 26/26]
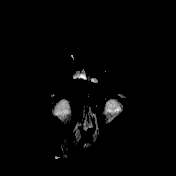

[Series 15: T2 · coronal · 5.0mm · 0.86mm/px · 3 of 27 slices shown (3 of 3)]
[im 1/27]
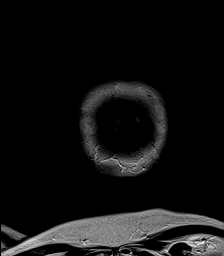
[im 14/27]
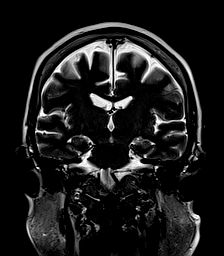
[im 27/27]
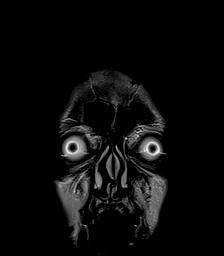

[42 of 48 positions shown; findings below may reference images not displayed]

FINDINGS: Brain: Diffusion imaging shows a total of 5 or 6 1 cm or smaller
infarctions affecting the cortex in the right frontal, parietal and
occipital lobes. Similar but less numerous infarctions are noted in
the right parietal region. The most likely explanation is that of
watershed infarctions. Micro embolic infarctions are possible. There
is an old small cortical infarction at the right frontoparietal
vertex which does not show acute restricted diffusion. No large
vessel territory infarction. The brainstem and cerebellum are
normal. No evidence of pre-existing small-vessel disease. No
hemorrhage, hydrocephalus or extra-axial collection.

Vascular: Major vessels at the base of the brain show flow.

Skull and upper cervical spine: Negative

Sinuses/Orbits: Clear/normal

Other: None
IMPRESSION: Several acute subcentimeter cortical infarctions affecting both
hemispheres as outlined above, most consistent with watershed
infarctions. Micro embolic infarctions are possible but seemingly
less likely. No large confluent infarction, swelling or hemorrhage.

Old small cortical infarction at the right frontoparietal vertex.

## 2021-06-05 SURGERY — EGD (ESOPHAGOGASTRODUODENOSCOPY)
Anesthesia: Moderate Sedation

## 2021-06-05 MED ORDER — SODIUM CHLORIDE 0.9 % IV SOLN: INTRAVENOUS | Status: DC

## 2021-06-05 MED ORDER — LEVETIRACETAM IN NACL 1000 MG/100ML IV SOLN
1000.0000 mg | Freq: Once | INTRAVENOUS | Status: AC
  Administered 2021-06-05: 1000 mg via INTRAVENOUS
  Filled 2021-06-05: qty 100

## 2021-06-05 MED ORDER — NOREPINEPHRINE 4 MG/250ML-% IV SOLN
0.0000 ug/min | INTRAVENOUS | Status: DC
  Administered 2021-06-05: 4 ug/min via INTRAVENOUS
  Administered 2021-06-06: 10 ug/min via INTRAVENOUS
  Administered 2021-06-06: 5 ug/min via INTRAVENOUS
  Filled 2021-06-05: qty 250
  Filled 2021-06-05: qty 500
  Filled 2021-06-05: qty 250

## 2021-06-05 MED ORDER — SODIUM CHLORIDE 0.9 % IV SOLN: INTRAVENOUS | Status: DC | PRN

## 2021-06-05 MED ORDER — FUROSEMIDE 10 MG/ML IJ SOLN
40.0000 mg | Freq: Three times a day (TID) | INTRAMUSCULAR | Status: DC
  Administered 2021-06-05 – 2021-06-06 (×4): 40 mg via INTRAVENOUS
  Filled 2021-06-05 (×4): qty 4

## 2021-06-05 MED ORDER — ATROPINE SULFATE 1 MG/10ML IJ SOSY
PREFILLED_SYRINGE | INTRAMUSCULAR | Status: AC
Start: 2021-06-05 — End: 2021-06-05
  Filled 2021-06-05: qty 10

## 2021-06-05 MED ORDER — LORAZEPAM 2 MG/ML IJ SOLN
2.0000 mg | INTRAMUSCULAR | Status: DC | PRN
  Administered 2021-06-05 (×2): 2 mg via INTRAVENOUS
  Administered 2021-06-06: 1 mg via INTRAVENOUS
  Administered 2021-06-07 – 2021-06-13 (×4): 2 mg via INTRAVENOUS
  Filled 2021-06-05 (×9): qty 1

## 2021-06-05 MED ORDER — ALBUMIN HUMAN 25 % IV SOLN
25.0000 g | Freq: Four times a day (QID) | INTRAVENOUS | Status: AC
  Administered 2021-06-05 – 2021-06-06 (×4): 25 g via INTRAVENOUS
  Filled 2021-06-05 (×4): qty 100

## 2021-06-05 MED ORDER — DEXTROSE-NACL 5-0.9 % IV SOLN: INTRAVENOUS | Status: DC

## 2021-06-05 MED ORDER — EPINEPHRINE 1 MG/10ML IJ SOSY
PREFILLED_SYRINGE | INTRAMUSCULAR | Status: AC
  Filled 2021-06-05: qty 10

## 2021-06-05 MED ORDER — LEVETIRACETAM IN NACL 1500 MG/100ML IV SOLN
1500.0000 mg | Freq: Two times a day (BID) | INTRAVENOUS | Status: DC
  Administered 2021-06-05 – 2021-06-13 (×17): 1500 mg via INTRAVENOUS
  Filled 2021-06-05 (×19): qty 100

## 2021-06-05 MED ORDER — LEVETIRACETAM IN NACL 500 MG/100ML IV SOLN
500.0000 mg | Freq: Two times a day (BID) | INTRAVENOUS | Status: DC
  Administered 2021-06-05: 500 mg via INTRAVENOUS
  Filled 2021-06-05 (×2): qty 100

## 2021-06-05 MED ORDER — PROPOFOL 1000 MG/100ML IV EMUL
5.0000 ug/kg/min | INTRAVENOUS | Status: DC
  Administered 2021-06-05: 40 ug/kg/min via INTRAVENOUS
  Administered 2021-06-05: 35 ug/kg/min via INTRAVENOUS
  Administered 2021-06-05: 5 ug/kg/min via INTRAVENOUS
  Administered 2021-06-06: 40 ug/kg/min via INTRAVENOUS
  Administered 2021-06-06: 45 ug/kg/min via INTRAVENOUS
  Administered 2021-06-06: 40 ug/kg/min via INTRAVENOUS
  Filled 2021-06-05 (×6): qty 100

## 2021-06-05 NOTE — Procedures (Addendum)
Patient Name: Colin Hamilton  ?MRN: [id number]  ?Epilepsy Attending: Charlsie Hamilton  ?Referring Physician/Provider: Lorin Glass, MD ?Date: 06/04/2021 ?Duration: 06/11/2021 0655 to 1719 ? 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  EEG to evaluate for seizure. ? ?Level of alertness:  lethargic  ? ?AEDs during EEG study: Versed ? ?Technical aspects: This EEG was obtained using a 10 lead EEG system positioned circumferentially without any parasagittal coverage (rapid EEG). Computer selected EEG is reviewed as  well as background features and all clinically significant events. ? ?Description: EEG initially showed continuous generalized 3 to 6 Hz theta and delta slowing.  Gradually EEG showed burst suppression pattern with EEG suppression lasting 5 to 7 seconds alternating with brief low-amplitude bursts of 15 to 18 Hz beta activity lasting 1 to 2 seconds.  Hyperventilation and photic stimulation were not performed.    ? ?ABNORMALITY ?- Continuous slow, generalized ? ?IMPRESSION: ?This limited cerebral EEG was initially suggestive of moderate to severe diffuse encephalopathy, nonspecific etiology.  Gradually, EEG was suggestive of profound with encephalopathy, nonspecific etiology but likely due to sedation.  No seizures or epileptiform discharges were seen throughout the recording. ? ?If concern for ictal-interictal activity persists, consider conventional EEG. ? ?Colin Hamilton  ? ?

## 2021-06-05 NOTE — Progress Notes (Signed)
Placed oral airway to prevent patient from biting ETT.  ?

## 2021-06-05 NOTE — Procedures (Signed)
Arterial Catheter Insertion Procedure Note ? ?Colin Hamilton  ?[id number]  ?[birth year] ? ?Date:06/17/2021  ?Time:9:25 AM  ? ? ?Provider Performing: Trey Sailors  ? ? ?Procedure: Insertion of Arterial Line (18299) without US guidance ? ?Indication(s) ?Blood pressure monitoring and/or need for frequent ABGs ? ?Consent ?Risks of the procedure as well as the alternatives and risks of each were explained to the patient and/or caregiver.  Consent for the procedure was obtained and is signed in the bedside chart ? ?Anesthesia ?None ? ? ?Time Out ?Verified patient identification, verified procedure, site/side was marked, verified correct patient position, special equipment/implants available, medications/allergies/relevant history reviewed, required imaging and test results available. ? ? ?Sterile Technique ?Maximal sterile technique including full sterile barrier drape, hand hygiene, sterile gown, sterile gloves, mask, hair covering, sterile ultrasound probe cover (if used). ? ? ?Procedure Description ?Area of catheter insertion was cleaned with chlorhexidine and draped in sterile fashion. Without real-time ultrasound guidance an arterial catheter was placed into the left radial artery.  Appropriate arterial tracings confirmed on monitor.   ? ? ?Complications/Tolerance ?None; patient tolerated the procedure well. ? ? ?EBL ?Minimal ? ? ?Specimen(s) ?None ? ?

## 2021-06-05 NOTE — Progress Notes (Signed)
Pt was supposed to come down to MRI at 11am. 45 mins of table time mis-utilized. Pt became too unstable. MRI had to move on to the next pt to properly utilized scanner. Multiple stats and outpt exams waiting.  ?

## 2021-06-05 NOTE — Progress Notes (Signed)
RT NOTE:  Pt transported to and from MRI via ventilator with no complications noted. 

## 2021-06-05 NOTE — Progress Notes (Signed)
? ?NAME:  Colin Hamilton, MRN:  [id number], DOB:  1960/08/03, LOS: 5 ?ADMISSION DATE:  05/27/2021, CONSULTATION DATE:  06/16/2021 ? ?REFERRING MD:  Cyndia Skeeters, TRH , CHIEF COMPLAINT:  resp distress  ? ?History of Present Illness:  ?61 year old admitted 3/30 with shortness of breath and abdominal pain -1 day after colonoscopy- with lactic acidosis, chest x-ray showing bilateral asymmetric airspace disease, presumptive diagnosis of aspiration pneumonia with sepsis ?He was treated with empiric cefepime/vancomycin , BiPAP for work of breathing ?Seen by general surgery for abdominal pain ? ?CT chest/abdomen/pelvis showed multifocal pneumonia, large hiatal hernia and an area of enhancement around the right bladder wall ?PCCM consulted on 4/2 for increased work of breathing, tachycardia and encephalopathy ?Consultants: GI, general surgery ?I have reviewed ED, initial H&P and above consultants notes ? ?Pertinent  Medical History  ?Barrett's esophagus ?Esophagectomy with gastric pull-through ?Solitary right kidney , CKD stage III yea ?Alcohol use ?afib ? ?Significant Hospital Events: ?Including procedures, antibiotic start and stop dates in addition to other pertinent events   ?3/30 CT chest/abdomen/pelvis -nodular infiltrates especially in left lung, large hiatal hernia versus gastric polyp, sigmoid diverticulosis, enhancement along right lateral bladder wall, started on vanc, flagyl, cefepime ?3/31 Admitted to [facility] ?4/1 abx changed to azithro, ceftriaxone, flagyl  ?4/2 ctx changed to cefepime ?4/3 PCCM consult, worsening resp distress> intubated, pressors  ?4/4 precedex added, remains on 50%/ 8 peep ?4/5 seizure; CT head/ ceribell/ MRI ordered ? ? ?3/30 SARS/ flu > neg ?3/31 MRSA PCR > neg ?3/30 UC > < 10k colonies, insignificant growth ?3/30 Bcx2 > ngtd ?3/31 urine strep> neg ?3/31 urine Legionella >  ?4/2 trach asp > ? ?Interim History / Subjective:  ?Seizure this am. ?Ceribel hooked up. ?Given versed and started on  propofol ? ?Objective   ?Blood pressure 140/86, pulse (!) 111, temperature 98.2 ?F (36.8 ?C), temperature source Axillary, resp. rate (!) 30, height 5\' 4"  (1.626 m), weight 63.4 kg, SpO2 100 %. ?   ?Vent Mode: PRVC ?FiO2 (%):  [40 %] 40 % ?Set Rate:  [28 bmp-30 bmp] 30 bmp ?Vt Set:  [360 mL] 360 mL ?PEEP:  [8 cmH20] 8 cmH20 ?Plateau Pressure:  [24 [id number] cmH20] 26 cmH20  ? ?Intake/Output Summary (Last 24 hours) at 06/17/2021 0819 ?Last data filed at 06/09/2021 0746 ?Gross per 24 hour  ?Intake 4276.94 ml  ?Output 3950 ml  ?Net 326.94 ml  ? ? ?Filed Weights  ? 06/02/21 0500 06/03/21 0500 06/04/21 0500  ?Weight: 61.3 kg 61.4 kg 63.4 kg  ? ?Examination: ?No distress on vent ?L scleral implant noted ?R pupil reactive ?Lungs with scattered rhonci, triggers vent ?Ext warm ?Visible seizures have resolved, not following commands ? ?K up ?hemolysis ?WBC improved ?Cr stable ?ARDS on CXR stable ? ?Resolved Hospital Problem list   ? ? ?Assessment & Plan:  ?Acute hypoxic respiratory failure ?Aspiration pneumonia/ pneumonitis  ?Likely underlying COPD versus history of asthma ?Smoker ?- Will start standing diuresis ?- Continue lung protective tidal volumes ?- Nebs as ordered ?- Sedation titrated to ventilator synchrony ?- Should be close to done with abx, we are on day 5 or 6 ? ?Shock- mostly sedation related at this point ?- Levophed titrated to MAP 65 ? ?Acute metabolic encephalopathy ?Sedation needs for mechanical ventilation ?Hx of EtOH - reported use 3 weeks ago so doubt withdrawal ?Unequal appearing pupils- prior surgery in left eye ?Hx insomnia ?New: seizures ?- MRI brain, CT head ?- Start keppra ?- Ceribel, appreciate Dr. Hortense Ramal read ?- Switch precedex  to propofol ? ?CKD stage II ?Solitary right kidney ?- Avoid nephrotoxins ?- Diuretic challenge ?- Bladder scan qshift ? ?Abdominal pain ?Large hiatal hernia ?Sigmoid diverticulosis with concern for sigmoid mass vs diverticular stricture- no interventions needed or warranted at  this moment ?No PO Access ?- GI to try to place NGT today ?- Ultimately may need consideration for percutaneous jejunostomy tube ? ?Bladder wall thickening ?- evaluation by urology/cystoscopy once acute issues resolved ? ?Normocytic anemia ?- Hgb varying over last several days, 10.1 yest> 8.9.  no evidence of bleeding.  Continue to clinically monitor  ?- trend CBC, transfuse for Hgb < 7 ? ?Best Practice (right click and "Reselect all SmartList Selections" daily)  ? ?Diet/type: NPO- no access as of now ?DVT prophylaxis: LMWH ?GI prophylaxis: PPI ?Lines: N/A ?Foley:  N/A ?Code Status:  full code ?Last date of multidisciplinary goals of care discussion [NA].  Spoke with patient's brother, Oden Hollenbach by phone [phone].  Reports patient currently lives w/ a roommate but all his family are in [place].  He confirmed he is not married but has 3 kids (1 biological, [place]?, and 2 adoptive> including Darrick Meigs Debroah Baller unclear of last name) [phone]).  Sister listed, Marney Setting, passed away 53yrs ago and I will remove her from contacts.  Updated brother on events.  Legal NOK would be one of his children, family to elect a point of contact and let us know.  All questions answered at this time. ? ? ? ?Patient critically ill due to seizures, respiratory failure, shock ?Interventions to address this today sedation/AED titration, vent titration, pressor titration ?Risk of deterioration without these interventions is high ? ?I personally spent 78 minutes providing critical care not including any separately billable procedures ? ?Erskine Emery MD ?[facility] ? ?Prefer epic messenger for cross cover needs ?If after hours, please call E-link ? ? ? ?

## 2021-06-05 NOTE — Progress Notes (Signed)
Tonic clonic seizure witnessed multiple minutes ?Question DTs ?Switch precedex to prop ?Given versed x 1 ?Ceribel ?CT head ?Keppra ? ?

## 2021-06-05 NOTE — Progress Notes (Signed)
eLink Physician-Brief Progress Note ?Patient Name: Halley Kincer ?DOB: 1960-03-26 ?MRN: [id number] ? ? ?Date of Service ? Jun 08, 2021  ?HPI/Events of Note ? Ventilator asynchrony - Patient currently on Fentanyl IV infusion at maximum of 200 mcg/hour.  ?eICU Interventions ? Plan: ?Increase ceiling on Fentanyl IV infusion to 400 mcg/hour. Titrate to RASS = 0 to -1.  ? ? ? ?Intervention Category ?Major Interventions: Other: ? ?Kashawna Manzer Dennard Nip ?06-08-2021, 1:19 AM ?

## 2021-06-05 NOTE — Brief Op Note (Signed)
05/10/2021 - 06/07/2021 ? ?6:27 PM ? ?PATIENT:  Colin Hamilton  61 y.o. male ? ?PRE-OPERATIVE DIAGNOSIS:  History of failed NG tube placement ? ?POST-OPERATIVE DIAGNOSIS:  successful nasogastric tube placement ? ?PROCEDURE:  Procedure(s) with comments: ?ESOPHAGOGASTRODUODENOSCOPY (EGD) (N/A) - bedside ? ?SURGEON:  Surgeon(s) and Role: ?   Charlott Rakes, MD - Primary ? ?16 gauge NG tube successfully placed into the distal stomach for enteral feeding. Defer to primary team for type and rate of feeding to use. See procedure note for complete findings and recs. No further GI recs. Will sign off. Call us back if needed. ?

## 2021-06-05 NOTE — Progress Notes (Signed)
EEG complete - results pending 

## 2021-06-05 NOTE — Consult Note (Signed)
Neurology Consultation ?Reason for Consult: Seizure ?Referring Physician: Tamala Julian, D ? ?CC: Seizure ? ?History is obtained from: Chart review ? ?HPI: Colin Hamilton is a 61 y.o. male with a history of asthma, htn who was admitted on 3/30 with SOB and generalized weakness. SOB started during colonoscopy, then continued to get worse. He was febrile and treated for sepsis and started on bipap. BP at 6am just prior to the seizure was 60 systolic.  While I was in the room evaluating the patient, his blood pressure dropped from [id number] systolic to 60 systolic with no definite cause.  He has been started on propofol but been on that for about 45 minutes at the time of blood pressure drop. ? ?LKW: unclear ?tpa given?: no, unclear LKW due to sedation ? ? ?ROS: Unable to obtain due to altered mental status.  ? ?Past Medical History:  ?Diagnosis Date  ? Asthma   ? Asthma in adult, mild intermittent, uncomplicated 0000000  ? Essential hypertension 06/05/2020  ? GERD with esophagitis 06/05/2020  ? History of alcohol abuse 06/05/2020  ? ? ? ?Family History  ?Problem Relation Age of Onset  ? Cancer Sister   ? ? ? ?Social History:  reports that he has quit smoking. He has never used smokeless tobacco. He reports current alcohol use. He reports current drug use. Drug: Marijuana. ? ? ?Exam: ?Current vital signs: ?BP 122/75   Pulse (!) 114   Temp 98.2 ?F (36.8 ?C) (Axillary)   Resp (!) 30   Ht 5\' 4"  (1.626 m)   Wt 63.4 kg   SpO2 96%   BMI 23.99 kg/m?  ?Vital signs in last 24 hours: ?Temp:  [96.3 ?F (35.7 ?C)-98.2 ?F (36.8 ?C)] 98.2 ?F (36.8 ?C) (04/05 0700) ?Pulse Rate:  [103-152] 114 (04/05 0855) ?Resp:  [24-35] 30 (04/05 0855) ?BP: (64-140)/(37-93) 122/75 (04/05 0855) ?SpO2:  [77 %-100 %] 96 % (04/05 0855) ?FiO2 (%):  [40 %-50 %] 50 % (04/05 0855) ? ? ?Physical Exam  ?Constitutional: Appears well-developed and well-nourished.  ?Psych: Affect appropriate to situation ?Eyes: No scleral injection ?HENT: No OP obstruction ?MSK: no joint  deformities.  ?Cardiovascular: Normal rate and regular rhythm.  ?Respiratory: Effort normal, non-labored breathing ?GI: Soft.  No distension. There is no tenderness.  ?Skin: WDI ? ?Neuro: ?Mental Status: ?Patient is comatose, he grimacesto noxious stimulation.  He does not follow commands. ?Cranial Nerves: ?II: His right pupil is reactive, his left pupil is difficult to visualize due to an implanted lens ?III,IV, VI: Left eye slightly exotropic compared to the right, difficult to check doll's due to apparatus ?V, VII: Corneals are intact ?Motor: ?He has no movement to noxious stimulation in any extremity sensory: ?As above Deep Tendon Reflexes: ?2+ and symmetric in the patellae.  ?Cerebellar: ?Does not perform ? ? ?I have reviewed labs in epic and the results pertinent to this consultation are: ?Ca 7.7, Albumin 2.7, Calcium corrects to 8.7 ?Glucose 122(7:47 am) ?Sodium 137 ?Mg 1.7 ? ?Impression: 61 year old male with new onset seizures in the setting of severe hypotension/blood pressure fluctuation.  Possibilities include hypoperfusion related seizure, posterior reversible encephalopathy syndrome secondary to failure of autoregulation, seizure due to umasking of predispoition in the settin gof physiological stress, or much less likely CNS infection. ? ?Recommendations: ?1) MRI brain  ?2) EEG  ?3) Keppra 500mg  BID ?4) If he has any further seizures, will need transfer for TLM EEG.  ?5) avoid BP lability if possible. ?6) will follow.  ? ? ?Roland Rack, MD ?  [facility] ?[phone] ? ?If 7pm- 7am, please page neurology on call as listed in [place]. ? ?

## 2021-06-05 NOTE — Progress Notes (Signed)
[facility] ?Progress Note ? ?   ?Subjective: ?CC-  ?Sedated on the vent. On 8 of levo. Events over night noted. Concern for generalized tonic-clonic seizures yesterday. EEG, CT head, MR brain today. ? ?Objective: ?Vital signs in last 24 hours: ?Temp:  [96.3 ?F (35.7 ?C)-98.2 ?F (36.8 ?C)] 98.2 ?F (36.8 ?C) (04/05 0700) ?Pulse Rate:  [103-152] 114 (04/05 0855) ?Resp:  [24-35] 30 (04/05 0855) ?BP: (64-140)/(37-93) 122/75 (04/05 0855) ?SpO2:  [77 %-100 %] 96 % (04/05 0855) ?FiO2 (%):  [40 %-50 %] 50 % (04/05 0855) ?Last BM Date : 06/02/21 ? ?Intake/Output from previous day: ?04/04 0701 - 04/05 0700 ?In: 5509.9 [I.V.:5459.9; IV Piggyback:50] ?Out: 4800 [Urine:4800] ?Intake/Output this shift: ?Total I/O ?In: 535.3 [I.V.:438; IV Piggyback:97.3] ?Out: -  ? ?PE: ?Gen:  sedated on the vent ?Card:  mild tachy HR 100-110s ?Abd: soft, ND, does not appear tender ? ?Lab Results:  ?Recent Labs  ?  06/04/21 ?[id number] 06/23/2021 ?0401  ?WBC 19.3* 14.7*  ?HGB 8.9* 8.8*  ?HCT 28.6* 28.2*  ?PLT 143* 136*  ? ?BMET ?Recent Labs  ?  06/04/21 ?[id number] 06/07/2021 ?0242  ?NA 138 137  ?K 4.5 5.6*  ?CL 111 107  ?CO2 22 24  ?GLUCOSE 128* 129*  ?BUN 11 8  ?CREATININE 1.11 1.19  ?CALCIUM 7.5* 7.7*  ? ?PT/INR ?Recent Labs  ?  06/03/21 ?[id number]  ?LABPROT 18.7*  ?INR 1.6*  ? ?CMP  ?   ?Component Value Date/Time  ? NA 137 06/18/2021 0242  ? K 5.6 (H) 06/20/2021 0242  ? CL 107 06/19/2021 0242  ? CO2 24 06/18/2021 0242  ? GLUCOSE 129 (H) 06/21/2021 0242  ? BUN 8 06/28/2021 0242  ? CREATININE 1.19 06/26/2021 0242  ? CALCIUM 7.7 (L) 06/11/2021 0242  ? PROT 6.2 (L) 06/02/2021 0309  ? ALBUMIN 2.5 (L) 06/03/2021 0528  ? AST 20 06/02/2021 0309  ? ALT 7 06/02/2021 0309  ? ALKPHOS 66 06/02/2021 0309  ? BILITOT 0.6 06/02/2021 0309  ? GFRNONAA >60 06/01/2021 0242  ? ?Lipase  ?   ?Component Value Date/Time  ? LIPASE 19 06/03/2021 0528  ? ? ? ? ? ?Studies/Results: ?DG Chest Port 1 View ? ?Result Date: 06/28/2021 ?CLINICAL DATA:  Respiratory failure. EXAM: PORTABLE  CHEST 1 VIEW COMPARISON:  06/04/2021 FINDINGS: The endotracheal tube is 3 cm above the carina. New right PICC line in good position with its tip near the cavoatrial junction. Persistent diffuse interstitial and airspace process in the lungs and small bilateral pleural effusions. Stable large hiatal hernia. IMPRESSION: 1. New right PICC line in good position. 2. Persistent diffuse interstitial and airspace process and small effusions. Electronically Signed   By: Marijo Sanes M.D.   On: 06/26/2021 07:45  ? ?DG CHEST PORT 1 VIEW ? ?Result Date: 06/04/2021 ?CLINICAL DATA:  Check ETT placement. EXAM: PORTABLE CHEST 1 VIEW COMPARISON:  Portable chest yesterday at 4:27 a.m. FINDINGS: 4:43 a.m., 06/04/2021. ETT tip has been advanced to within 1.5 cm from the carina and could be withdrawn 1-2 cm for optimal placement. There is a large hiatal hernia with stable mediastinum. Mild aortic atherosclerosis. Cardiac size is stable, with mild central vascular fullness. Diffuse bilateral airspace disease persists unchanged overlying small layering pleural effusions. There is thoracic spondylosis and bridging enthesopathy. IMPRESSION: 1. ETT tip is now 1.5 cm from the carina, could be withdrawn 1-2 cm for optimal placement. 2. Bilateral diffuse airspace disease is not significantly changed. Electronically Signed   By: Ninfa Linden.D.  On: 06/04/2021 07:11  ? ?[place] EKG SITE RITE ? ?Result Date: 06/04/2021 ?If [employer] not attached, placement could not be confirmed due to current cardiac rhythm. ? ?Rapid EEG ? ?Result Date: 06/25/2021 ?Lora Havens, MD     06/06/2021  8:31 AM Patient Name: Colin Hamilton MRN: [id number] Epilepsy Attending: Lora Havens Referring Physician/Provider: Candee Furbish, MD Date: 06/03/2021 Duration: Patient history: 61 year old male with generalized tonic-clonic seizures.  EEG to evaluate for seizure. Level of alertness:  lethargic AEDs during EEG study: Versed Technical aspects: This EEG was  obtained using a 10 lead EEG system positioned circumferentially without any parasagittal coverage (rapid EEG). Computer selected EEG is reviewed as  well as background features and all clinically significant events. Description: EEG showed continuous generalized 3 to 6 Hz theta and delta slowing. Hyperventilation and photic stimulation were not performed.   ABNORMALITY - Continuous slow, generalized IMPRESSION: This limited cerebral EEG is suggestive of moderate to severe diffuse encephalopathy, nonspecific etiology.  No seizures or epileptiform discharges were seen throughout the recording. If concern for ictal-interictal activity persists, consider conventional EEG. Priyanka Barbra Sarks   ? ?Anti-infectives: ?Anti-infectives (From admission, onward)  ? ? Start     Dose/Rate Route Frequency Ordered Stop  ? 06/03/21 2000  cefTRIAXone (ROCEPHIN) 1 g in sodium chloride 0.9 % 100 mL IVPB       ? 1 g ?200 mL/hr over 30 Minutes Intravenous Every 24 hours 06/03/21 1338 06/08/21 1959  ? 06/02/21 0800  ceFEPIme (MAXIPIME) 2 g in sodium chloride 0.9 % 100 mL IVPB  Status:  Discontinued       ? 2 g ?200 mL/hr over 30 Minutes Intravenous Every 12 hours 06/02/21 0704 06/03/21 1338  ? 06/01/21 1200  cefTRIAXone (ROCEPHIN) 2 g in sodium chloride 0.9 % 100 mL IVPB  Status:  Discontinued       ? 2 g ?200 mL/hr over 30 Minutes Intravenous Every 24 hours 06/01/21 0645 06/02/21 0656  ? 06/01/21 1000  azithromycin (ZITHROMAX) 500 mg in sodium chloride 0.9 % 250 mL IVPB       ? 500 mg ?250 mL/hr over 60 Minutes Intravenous Every 24 hours 06/01/21 0645 06/03/21 1147  ? 06/01/21 0800  metroNIDAZOLE (FLAGYL) IVPB 500 mg  Status:  Discontinued       ? 500 mg ?100 mL/hr over 60 Minutes Intravenous Every 12 hours 06/01/21 0645 06/03/21 1338  ? 05/31/21 1000  ceFEPIme (MAXIPIME) 2 g in sodium chloride 0.9 % 100 mL IVPB  Status:  Discontinued       ? 2 g ?200 mL/hr over 30 Minutes Intravenous Every 12 hours 05/24/2021 1906 06/01/21 0645  ? 05/31/21  1000  vancomycin (VANCOCIN) 500 mg in sodium chloride 0.9 % 100 mL IVPB  Status:  Discontinued       ? 500 mg ?100 mL/hr over 60 Minutes Intravenous Every 12 hours 05/09/2021 1906 05/31/21 0910  ? 05/31/21 1000  vancomycin (VANCOREADY) IVPB 500 mg/100 mL  Status:  Discontinued       ? 500 mg ?100 mL/hr over 60 Minutes Intravenous Every 12 hours 05/31/21 0910 06/01/21 0645  ? 05/20/2021 1845  ceFEPIme (MAXIPIME) 2 g in sodium chloride 0.9 % 100 mL IVPB       ? 2 g ?200 mL/hr over 30 Minutes Intravenous  Once 05/22/2021 1839 05/28/2021 1922  ? 05/29/2021 1845  metroNIDAZOLE (FLAGYL) IVPB 500 mg       ? 500 mg ?100 mL/hr over 60  Minutes Intravenous  Once 05/16/2021 1839 05/11/2021 2039  ? 05/24/2021 1845  vancomycin (VANCOCIN) IVPB 1000 mg/200 mL premix       ? 1,000 mg ?200 mL/hr over 60 Minutes Intravenous  Once 05/14/2021 1839 05/27/2021 2048  ? ?  ? ? ? ?Assessment/Plan ?Abdominal pain ??Sigmoid mass vs diverticular stricture ?- CT scans from 3/21 and 3/30 (completed following c-scope) showing sigmoid diverticulosis and question of sigmoid mass. He does have developing adhesions to bladder. No bowel obstruction or perforation on CT 3/30 PM ?- underwent colonoscopy 3/30 in [place].  Documentation in care everywhere (full report obtained and scanned into Media) notes poor prep, solid stool noted in the cecum and the right colon and opaque thick liquid stool throughout, no frank mass noted in the colon but had edema/erythema next to the diverticulosis consistent with segmental colitis associated with diverticulosis, but no active diverticulitis. Biopsy was taken (I called again today and they will fax results over) ?- Abdomen is soft and nondistended. No indication for acute surgical intervention. ?- GI attempting EGD and NG placement today. If successful ok to start trophic tube feedings ?  ?FEN: NPO ?ID: currently rocephin ?VTE: lovenox ?  ?VDRF/ Aspiration pneumonia - per CCM ?Seizures - CT head, MR brain ?Barrett's  esophagus ?H/o what sounds like Ivor Lewis open esophagectomy with gastric pull through 09/2018 at [facility]  ?Solitary right kidney , CKD stage III ?Tobacco abuse ?Alcohol use ?HTN ? ?I reviewed Consultant

## 2021-06-05 NOTE — Op Note (Signed)
[facility] ?Patient Name: Colin Hamilton ?Procedure Date: 06/23/2021 ?MRN: [id number] ?Attending MD: Lear Ng , MD ?Date of Birth: May 05, 1960 ?CSN: [id number] ?Age: 61 ?Admit Type: Inpatient ?Procedure:                Upper GI endoscopy ?Indications:              Failure to thrive, NG placement for enteral feeding ?Providers:                Lear Ng, MD, Jaci Carrel, RN, Cindee Salt  ?                          Teaching laboratory technician ?Referring MD:             hospital team ?Medicines:                Propofol per Anesthesia, Per CCM ?Complications:            No immediate complications. ?Estimated Blood Loss:     Estimated blood loss: none. ?Procedure:                Pre-Anesthesia Assessment: ?                          - Prior to the procedure, a History and Physical  ?                          was performed, and patient medications and  ?                          allergies were reviewed. The patient's tolerance of  ?                          previous anesthesia was also reviewed. The risks  ?                          and benefits of the procedure and the sedation  ?                          options and risks were discussed with the patient.  ?                          All questions were answered, and informed consent  ?                          was obtained. Prior Anticoagulants: The patient has  ?                          taken no previous anticoagulant or antiplatelet  ?                          agents. ASA Grade Assessment: IV - A patient with  ?                          severe systemic disease that is a constant threat  ?  to life. After reviewing the risks and benefits,  ?                          the patient was deemed in satisfactory condition to  ?                          undergo the procedure. ?                          After obtaining informed consent, the endoscope was  ?                          passed under direct vision. Throughout the  ?                           procedure, the patient's blood pressure, pulse, and  ?                          oxygen saturations were monitored continuously. The  ?                          [id number]) Olympus endoscope was introduced  ?                          through the mouth, and advanced to the second part  ?                          of duodenum. The upper GI endoscopy was somewhat  ?                          difficult due to post-surgical anatomy and the  ?                          patient's cardiovascular instability (hypotension).  ?                          Successful completion of the procedure was aided by  ?                          managing the patient's medical instability and  ?                          performing the maneuvers documented (below) in this  ?                          report. The patient tolerated the procedure. ?Scope In: ?Scope Out: ?Findings: ?     A previous surgical anastomosis was found in the entire esophagus. ?     The Z-line was regular and was found 40 cm from the incisors. ?     Segmental mild inflammation characterized by congestion (edema) and  ?     erythema was found in the gastric antrum. ?     The cardia and gastric fundus were normal on retroflexion. ?     A few medium-sized sessile polyps with no  bleeding were found in the  ?     duodenal bulb. ?     The second portion of the duodenum was normal. ?     16 gauge nasogastric tube inserted into the nare with visualization of  ?     passaged of the NGT into the distal esophagus and advanced with  ?     confirmation endoscopically into the distal esophagus. Unable to advance  ?     NGT into the pyloric channel but the tip was left in the antrum and the  ?     endoscope was removed with confirmation of the tube still remaining in  ?     the distal stomach during withdrawal of the endoscope to the proximal  ?     stomach. The NGT remained in the stomach during withdrawal of the  ?     endoscope to the distal esophagus. NGT was  secured at the nose prior to  ?     withdrawal of the endoscope from the distal stomach. ?Impression:               - A previous surgical anastomosis was found. ?                          - Z-line regular, 40 cm from the incisors. ?                          - Gastritis. ?                          - A few duodenal polyps. ?                          - Normal second portion of the duodenum. ?                          - No specimens collected. ?                          - Successful placement of the NG tube into the  ?                          distal stomach. No gastric outlet obstruction seen. ?Moderate Sedation: ?     N/A ?Recommendation:           - Start enteral feeds through NG tube when desired  ?                          by primary team. ?Procedure Code(s):        --- Professional --- ?                          [phone], Esophagogastroduodenoscopy, flexible,  ?                          transoral; diagnostic, including collection of  ?                          specimen(s) by brushing or washing, when performed  ?                          (  separate procedure) ?Diagnosis Code(s):        --- Professional --- ?                          R62.7, Adult failure to thrive ?                          K29.70, Gastritis, unspecified, without bleeding ?                          Z98.890, Other specified postprocedural states ?                          K31.7, Polyp of stomach and duodenum ?CPT copyright 2019 [employer]. All rights reserved. ?The codes documented in this report are preliminary and upon coder review may  ?be revised to meet current compliance requirements. ?Lear Ng, MD ?06/17/2021 6:26:11 PM ?This report has been signed electronically. ?Number of Addenda: 0 ?

## 2021-06-05 NOTE — Progress Notes (Signed)
vLTM  started  all impedance below 10kohms  [facility] to monitor   pt event button tested ?

## 2021-06-05 NOTE — Progress Notes (Signed)
Observed patient on monitor to have oxygen saturation in 60-70%. Entered room to find patient actively seizing. At least 2-3 minutes witnessed before Versed administered. Dr. Myrla Halsted at bedside. Cerebell device placed on and rapid EEG started per Dr. Katrinka Blazing verbal order. ?

## 2021-06-05 NOTE — Progress Notes (Addendum)
Pt taken to MRI on vent with RT, RN and NP.  While preparing pt for MRI, pt had witnessed seizure.  RN gave 2mg  ativan.  Medication was effective, NP advised to proceed with MRI.  No further complications noted. ? ? , RN ?06/29/2021 ? ?

## 2021-06-06 ENCOUNTER — Inpatient Hospital Stay (HOSPITAL_COMMUNITY): Payer: Medicare (Managed Care)

## 2021-06-06 ENCOUNTER — Encounter (HOSPITAL_COMMUNITY): Payer: Self-pay | Admitting: Gastroenterology

## 2021-06-06 DIAGNOSIS — R6520 Severe sepsis without septic shock: Secondary | ICD-10-CM | POA: Diagnosis not present

## 2021-06-06 DIAGNOSIS — A419 Sepsis, unspecified organism: Secondary | ICD-10-CM | POA: Diagnosis not present

## 2021-06-06 DIAGNOSIS — G9341 Metabolic encephalopathy: Secondary | ICD-10-CM | POA: Diagnosis not present

## 2021-06-06 LAB — GLUCOSE, CAPILLARY
Glucose-Capillary: 112 mg/dL — ABNORMAL HIGH (ref 70–99)
Glucose-Capillary: 118 mg/dL — ABNORMAL HIGH (ref 70–99)
Glucose-Capillary: 121 mg/dL — ABNORMAL HIGH (ref 70–99)
Glucose-Capillary: 126 mg/dL — ABNORMAL HIGH (ref 70–99)
Glucose-Capillary: 100 mg/dL — ABNORMAL HIGH (ref 70–99)
Glucose-Capillary: 85 mg/dL (ref 70–99)

## 2021-06-06 LAB — BASIC METABOLIC PANEL
Anion gap: 10 (ref 5–15)
BUN: 8 mg/dL (ref 6–20)
CO2: 31 mmol/L (ref 22–32)
Calcium: 8.5 mg/dL — ABNORMAL LOW (ref 8.9–10.3)
Chloride: 97 mmol/L — ABNORMAL LOW (ref 98–111)
Creatinine, Ser: 1.48 mg/dL — ABNORMAL HIGH (ref 0.61–1.24)
GFR, Estimated: 54 mL/min — ABNORMAL LOW (ref >60)
Glucose, Bld: 104 mg/dL — ABNORMAL HIGH (ref 70–99)
Potassium: 3.7 mmol/L (ref 3.5–5.1)
Sodium: 138 mmol/L (ref 135–145)

## 2021-06-06 LAB — CBC
HCT: 25.1 % — ABNORMAL LOW (ref 39.0–52.0)
Hemoglobin: 8 g/dL — ABNORMAL LOW (ref 13.0–17.0)
MCH: 28.1 pg (ref 26.0–34.0)
MCHC: 31.9 g/dL (ref 30.0–36.0)
MCV: 88.1 fL (ref 80.0–100.0)
Platelets: 124 10*3/uL — ABNORMAL LOW (ref 150–400)
RBC: 2.85 MIL/uL — ABNORMAL LOW (ref 4.22–5.81)
RDW: 17.5 % — ABNORMAL HIGH (ref 11.5–15.5)
WBC: 10.3 10*3/uL (ref 4.0–10.5)
nRBC: 0 % (ref 0.0–0.2)

## 2021-06-06 LAB — TRIGLYCERIDES: Triglycerides: 173 mg/dL — ABNORMAL HIGH (ref <150)

## 2021-06-06 IMAGING — DX DG CHEST 1V PORT
1 series · 1 of 1 positions shown · non-contrast
Comparison: [DATE]

CLINICAL DATA: Shortness of breath and severe sepsis

EXAM:
PORTABLE CHEST 1 VIEW

[chest ap]
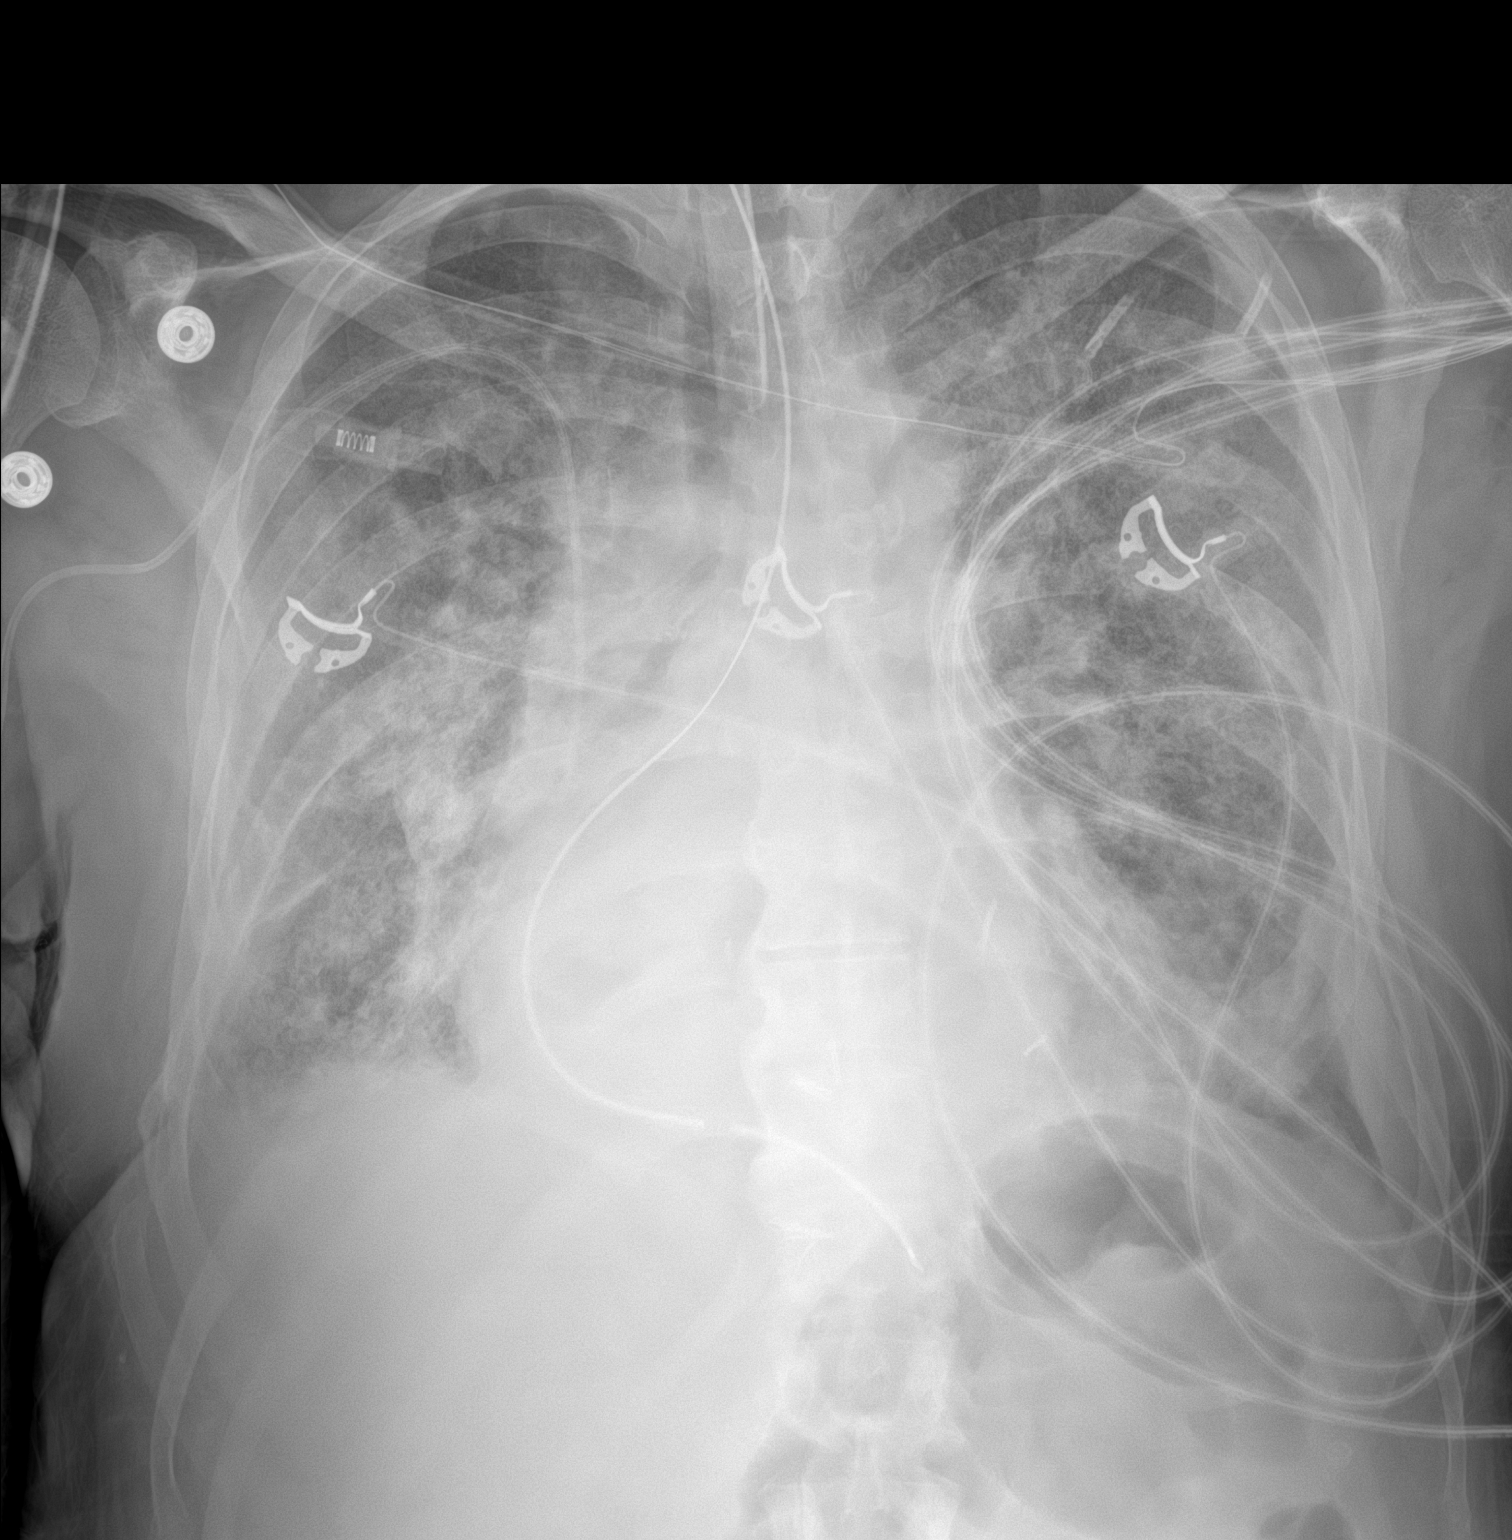

[1 of 1 positions shown; findings below may reference images not displayed]

FINDINGS: Endotracheal tube with tip at the clavicular heads. The enteric tube
tip and side port at the level of a gastric pull-through. Right PICC
with tip at the upper cavoatrial junction. Dense bilateral airspace
disease. Evidence of lateral pleural fluid at the left base.
Cardiomegaly and vascular pedicle widening.
IMPRESSION: 1. Unchanged bilateral pneumonia.
2. New enteric tube with tip and side port at the gastric
pull-through.

## 2021-06-06 IMAGING — CT CT ANGIO HEAD-NECK (W OR W/O PERF)
1 of 11 series · 5 of 33 positions shown · non-contrast
Comparison: Brain MRI from yesterday

CLINICAL DATA: Stroke follow-up

EXAM:
CT ANGIOGRAPHY HEAD AND NECK
TECHNIQUE: Multidetector CT imaging of the head and neck was performed using
the standard protocol during bolus administration of intravenous
contrast. Multiplanar CT image reconstructions and MIPs were
obtained to evaluate the vascular anatomy. Carotid stenosis
measurements (when applicable) are obtained utilizing NASCET
criteria, using the distal internal carotid diameter as the
denominator.

[Series 12: ax thins · axial · 0.39mm/px · z∈[-254,-38]mm · 5 of 326 slices shown]
[im 55/326  soft-tissue]
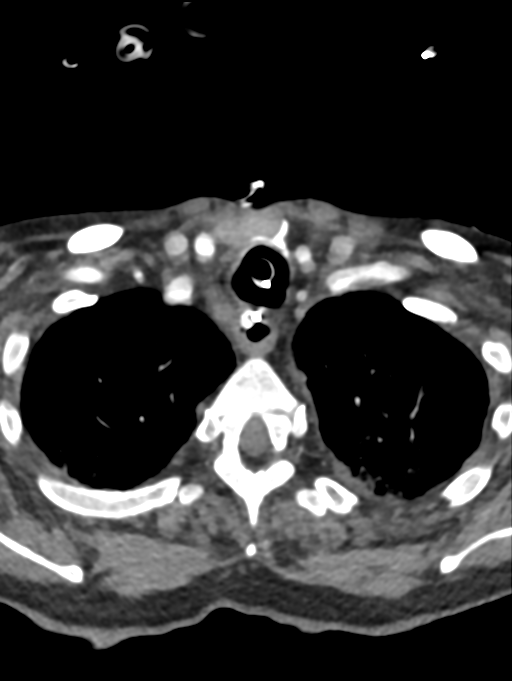
[im 109/326  bone]
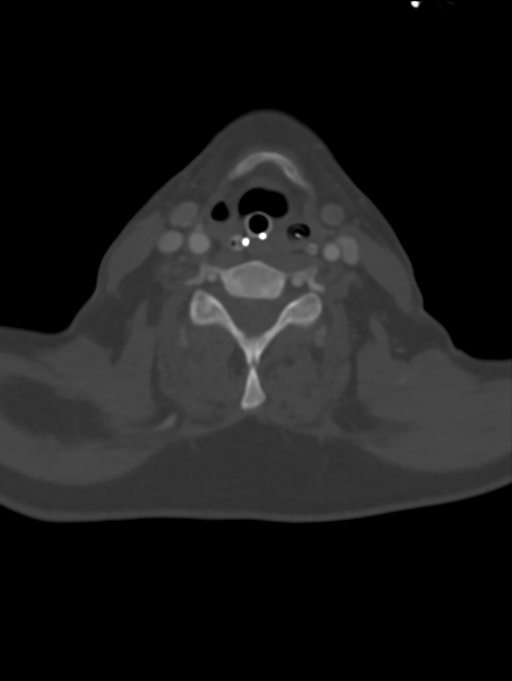
[im 163/326  soft-tissue]
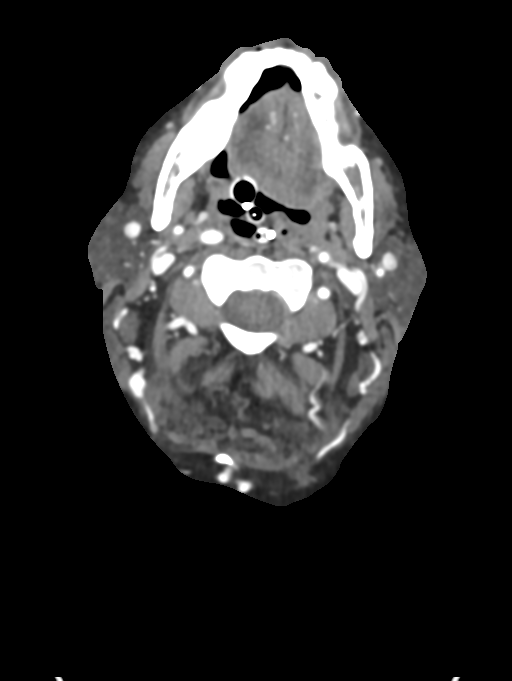
[im 217/326  bone]
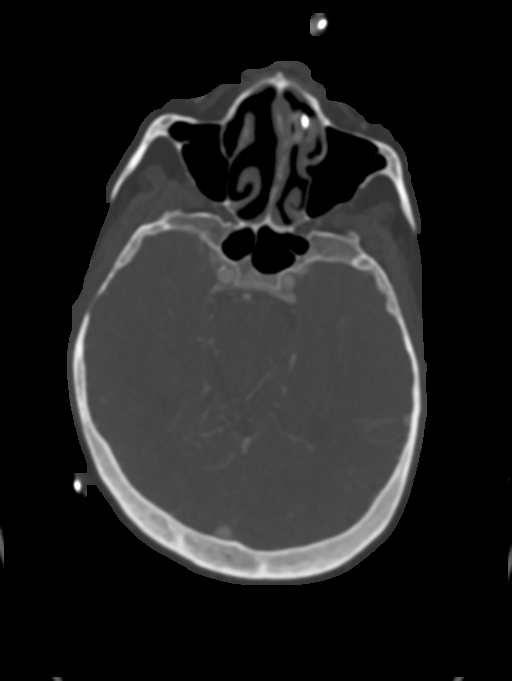
[im 271/326  soft-tissue]
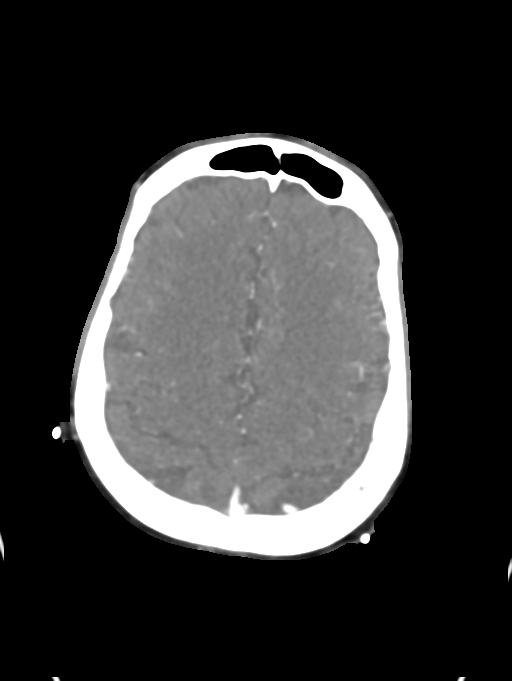

[5 of 33 positions shown; findings below may reference images not displayed]

RADIATION DOSE REDUCTION: This exam was performed according to the
departmental dose-optimization program which includes automated
exposure control, adjustment of the mA and/or kV according to
patient size and/or use of iterative reconstruction technique.

CONTRAST:  50mL OMNIPAQUE IOHEXOL 350 MG/ML SOLN
FINDINGS: CT HEAD FINDINGS

Brain: Known acute infarcts are subtle compared to prior brain MRI.
No gross progression and no hemorrhage.

Vascular: See below

Skull: Negative

Sinuses: Negative

Orbits: Negative

Review of the MIP images confirms the above findings

CTA NECK FINDINGS

Aortic arch: Atheromatous plaque and elongation. No acute finding or
dilatation.

Right carotid system: Mixed density plaque at the bifurcation and
proximal ICA without stenosis or ulceration.

Left carotid system: Atheromatous plaque centered at the
bifurcation, mixed density. No stenosis or ulceration.

Vertebral arteries: No proximal subclavian stenosis. Motion artifact
affects both proximal vertebral arteries with no suspected stenosis
and no beading or dissection flap seen.

Skeleton: No acute or aggressive finding

Other neck: Calcification and scar-like appearance anterior to the
distorted trachea at the thoracic inlet, suspect prior tracheostomy.

Upper chest: Extensive ground-glass and consolidative opacity in the
upper lungs with small fissural pleural fluid on the left.

Review of the MIP images confirms the above findings

CTA HEAD FINDINGS

Anterior circulation: No significant stenosis, proximal occlusion,
aneurysm, or vascular malformation. Mild atheromatous calcification
of the carotid siphon

Posterior circulation: The vertebral and basilar arteries are
smoothly contoured and diffusely patent. No branch occlusion,
beading, or flow limiting stenosis. Mild distal PCA atheromatous
irregularity is suspected.

Venous sinuses: Diffusely patent

Anatomic variants: Aplastic left A1 segment

Review of the MIP images confirms the above findings
IMPRESSION: No emergent arterial finding. There is atherosclerosis without major
vessel flow limiting stenosis or detected embolic source.

Acute infarcts are underestimated relative to prior brain MRI. No
hemorrhage or evidence of progression.

## 2021-06-06 MED ORDER — SODIUM CHLORIDE 0.9 % IV SOLN
100.0000 mg | Freq: Two times a day (BID) | INTRAVENOUS | Status: DC
  Administered 2021-06-06 – 2021-06-07 (×2): 100 mg via INTRAVENOUS
  Filled 2021-06-06 (×3): qty 10

## 2021-06-06 MED ORDER — FOLIC ACID 1 MG PO TABS
1.0000 mg | ORAL_TABLET | Freq: Every day | ORAL | Status: DC
  Administered 2021-06-06 – 2021-06-12 (×7): 1 mg
  Filled 2021-06-06 (×7): qty 1

## 2021-06-06 MED ORDER — PHENYLEPHRINE HCL-NACL 20-0.9 MG/250ML-% IV SOLN
0.0000 ug/min | INTRAVENOUS | Status: DC
  Administered 2021-06-06: 160 ug/min via INTRAVENOUS
  Administered 2021-06-06: 150 ug/min via INTRAVENOUS
  Administered 2021-06-06: 100 ug/min via INTRAVENOUS
  Administered 2021-06-06: 160 ug/min via INTRAVENOUS
  Filled 2021-06-06 (×5): qty 250

## 2021-06-06 MED ORDER — ACETAMINOPHEN 325 MG PO TABS
650.0000 mg | ORAL_TABLET | Freq: Four times a day (QID) | ORAL | Status: DC | PRN
Start: 2021-06-06 — End: 2021-06-13
  Administered 2021-06-10: 650 mg
  Filled 2021-06-06: qty 2

## 2021-06-06 MED ORDER — THIAMINE HCL 100 MG/ML IJ SOLN
100.0000 mg | Freq: Every day | INTRAMUSCULAR | Status: DC
  Administered 2021-06-06: 100 mg via INTRAVENOUS
  Filled 2021-06-06 (×2): qty 2

## 2021-06-06 MED ORDER — ADULT MULTIVITAMIN W/MINERALS CH
1.0000 | ORAL_TABLET | Freq: Every day | ORAL | Status: DC
Start: 2021-06-06 — End: 2021-06-12
  Administered 2021-06-06 – 2021-06-12 (×7): 1
  Filled 2021-06-06 (×7): qty 1

## 2021-06-06 MED ORDER — PROPOFOL 1000 MG/100ML IV EMUL
5.0000 ug/kg/min | INTRAVENOUS | Status: DC
  Administered 2021-06-06 – 2021-06-07 (×2): 45 ug/kg/min via INTRAVENOUS
  Filled 2021-06-06 (×2): qty 100

## 2021-06-06 MED ORDER — THIAMINE HCL 100 MG PO TABS
100.0000 mg | ORAL_TABLET | Freq: Every day | ORAL | Status: DC
Start: 2021-06-06 — End: 2021-06-12
  Administered 2021-06-07 – 2021-06-12 (×6): 100 mg
  Filled 2021-06-06 (×6): qty 1

## 2021-06-06 MED ORDER — SODIUM CHLORIDE 0.9 % IV SOLN
400.0000 mg | Freq: Once | INTRAVENOUS | Status: AC
  Administered 2021-06-06: 400 mg via INTRAVENOUS
  Filled 2021-06-06: qty 40

## 2021-06-06 MED ORDER — ACETAMINOPHEN 650 MG RE SUPP
650.0000 mg | Freq: Four times a day (QID) | RECTAL | Status: DC | PRN
Start: 2021-06-06 — End: 2021-06-13

## 2021-06-06 MED ORDER — STROKE: EARLY STAGES OF RECOVERY BOOK
Freq: Once | Status: AC
  Filled 2021-06-06: qty 1

## 2021-06-06 MED ORDER — IOHEXOL 350 MG/ML SOLN
100.0000 mL | Freq: Once | INTRAVENOUS | Status: AC | PRN
  Administered 2021-06-06: 50 mL via INTRAVENOUS

## 2021-06-06 MED ORDER — PHENYLEPHRINE CONCENTRATED 100MG/250ML (0.4 MG/ML) INFUSION SIMPLE
0.0000 ug/min | INTRAVENOUS | Status: DC
  Administered 2021-06-06: 150 ug/min via INTRAVENOUS
  Administered 2021-06-07: 115 ug/min via INTRAVENOUS
  Administered 2021-06-10: 50 ug/min via INTRAVENOUS
  Filled 2021-06-06 (×7): qty 250

## 2021-06-06 NOTE — Progress Notes (Signed)
? ?NAME:  Colin Hamilton, MRN:  [id number], DOB:  19-Aug-1960, LOS: 6 ?ADMISSION DATE:  05/12/2021, CONSULTATION DATE:  06/06/2021 ?REFERRING MD:  Cyndia Skeeters, TRH, CHIEF COMPLAINT:  resp distress   ? ?History of Present Illness:  ? ?61 year old admitted 3/30 with shortness of breath and abdominal pain -1 day after colonoscopy- with lactic acidosis, chest x-ray showing bilateral asymmetric airspace disease, presumptive diagnosis of aspiration pneumonia with sepsis. He was treated with empiric cefepime/vancomycin , BiPAP for work of breathing ?Seen by general surgery for abdominal pain ?  ?CT chest/abdomen/pelvis showed multifocal pneumonia, large hiatal hernia and an area of enhancement around the right bladder wall ? ?PCCM consulted on 4/2 for increased work of breathing, tachycardia and encephalopathy ? ?Came to the ICU 4/5 after rapid response O2 sats of 60-70%, actively seizing ? ?Pertinent  Medical History  ? ?Barrett's esophagus ?Esophagectomy with gastric pull-through ?Solitary right kidney , CKD stage III yea ?Alcohol use ?Afib ?Asthma  ?HTN ? ?Significant Hospital Events: ?Including procedures, antibiotic start and stop dates in addition to other pertinent events   ?3/30 CT chest/abdomen/pelvis -nodular infiltrates especially in left lung, large hiatal hernia versus gastric polyp, sigmoid diverticulosis, enhancement along right lateral bladder wall, started on vanc, flagyl, cefepime ?3/31 Admitted to [facility] ?4/1 abx changed to azithro, ceftriaxone, flagyl  ?4/2 ctx changed to cefepime ?4/3 PCCM consult, worsening resp distress> intubated, pressors  ?4/4 precedex added, remains on 50%/ 8 peep ?4/5 seizure; CT head/ ceribell/ MRI ordered  ? ?3/30 SARS/ flu > neg ?3/31 MRSA PCR > neg ?3/30 UC > < 10k colonies, insignificant growth ?3/30 Bcx2 > ngtd ?3/31 urine strep> neg ?3/31 urine Legionella >  ?4/2 trach asp > ? ?Interim History / Subjective:  ? ?Yesterday EGD performed and NG tube placed, started on trophic tube feeds   ?Currently on propofol and Levophed  ?Levophed has ranged from 4-12, Bps have been very labile  ?Over night thought to be having seizures because when he was suctioned he would look to the left and feet would move to left ? ?Objective   ?Blood pressure 99/61, pulse (!) 137, temperature 98.4 ?F (36.9 ?C), resp. rate (!) 33, height 5\' 4"  (1.626 m), weight 60 kg, SpO2 96 %. ?   ?Vent Mode: PRVC ?FiO2 (%):  [40 %-50 %] 40 % ?Set Rate:  [26 bmp] 26 bmp ?Vt Set:  [420 mL] 420 mL ?PEEP:  [8 cmH20] 8 cmH20 ?Plateau Pressure:  [11 cmH20-28 cmH20] 13 cmH20  ? ?Intake/Output Summary (Last 24 hours) at 06/06/2021 0929 ?Last data filed at 06/06/2021 [id number] ?Gross per 24 hour  ?Intake 2074.78 ml  ?Output 9000 ml  ?Net -6925.22 ml  ? ?Filed Weights  ? 06/03/21 0500 06/04/21 0500 06/06/21 0500  ?Weight: 61.4 kg 63.4 kg 60 kg  ? ? ?Examination: ?General: critically appearing male, on vent  ?HENT: L scleral implant with L pupil > R ?Lungs: on Vent, course sounds diffusely  ?Cardiovascular: Tachycardia  ?Abdomen: soft, non distended  ?Extremities: warm, dry  ?Neuro: non responsive ? ?Assessment & Plan:  ? ?Acute hypoxic respiratory failure ?Aspiration pneumonia/ pneumonitis  ?Likely underlying COPD versus history of asthma ?Smoker ?Currently on mechanical ventilation FiO2 40%, peep 8, RR 26, TV 420  ?CT head with unchanged bilateral pneumonia  ?Trach aspirate with rare candida albicans. ?Had been receiving asix 40mg  IV q 8 with 3L over night ?- continue propofol  ?- continue CTX (day 8 total of abx) for total of 10 days  ?- d/c lasix and  spot dose as needed  ? ?Acute metabolic encephalopathy ?New onset seizures ?Watershed infarcts  ?This morning neurology saw patient and noted L eye with gaze to L side and abnormal LLE movement. Found to be in status. Was given Ativan and started on Vimpat in addition to [place]  ?MRI brain showing Several acute subcentimeter cortical infarctions affecting both hemispheres, most consistent with watershed  infarctions. Micro embolic infarctions are possible but seemingly ?less likely. No large confluent infarction, swelling or hemorrhage. 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 ?EEG performed showing profound diffuse encephalopathy, nonspecific etiology which could be secondary to sedation.  No seizures or epileptiform discharges were seen throughout the recording. ?- neurology following, appreciate continued care and recommendations ?- continue Keppra 1500 BID ?- start Vimpat  ?- continue  propofol  ?- f/u CT angio head and neck  ? ?Shock 2/2 sedation? ?Likely 2/2 sedation needs and potentially sepsis from aspiration pneumonia  ?TTE 4/2 > EF 55-60%, no wall motion abnormalities, G1DD, normal RV ?BP has been very labile. He has required Levophed 4-12 ?- continue Levophed  ?- consider CTA chest  ? ?Abdominal pain ?Large hiatal hernia ?Sigmoid diverticulosis with concern for sigmoid mass vs diverticular stricture ?Also with developing adhesions to bladder. Colonoscopy with biopsy performed in [facility] ?NG tube placed by GI yesterday afternoon  ?EGD performed showing previous surgical anastomosis, Gastritis, A few duodenal polyps. ?- GI and surgery following, appreciate continued care and recommendations ?- trophic tube feeds per GI  ? ?Tachycardia  ?HR 137. EKG on admission with SVT ?- f/u EKG  ?- consider anticoagulation  ?- switched from Levophed to Neo ? ?CKD III ?Solitary right kidney  ?Cr 1.48, up from 1.19 yesterday. He has been receiving diuresis  ?- Will give dose of  ?- avoid nephrotoxic agents  ? ?Normocytic anemia  ?Hgb 8 down from 8.8 yesterday. Transfusion threshold 7 ?- continue to monitor  ? ? ?Best Practice (right click and "Reselect all SmartList Selections" daily)  ? ?Diet/type: NPO ?DVT prophylaxis: LMWH ?GI prophylaxis: PPI ?Lines: Arterial Line and yes and it is still needed ?Foley:  N/A ?Code Status:  full code ? ?Labs   ?CBC: ?Recent Labs  ?Lab 05/27/2021 ?1813  05/04/2021 ?1831 05/31/21 ?1048 06/01/21 ?0302 06/01/21 ?2320 06/02/21 ?0309 06/03/21 ?[id number] 06/04/21 ?[id number] 06/27/2021 ?0401 06/06/21 ?0530  ?WBC 13.2*  --  18.2*   < > 21.9* 23.3* 26.3* 19.3* 14.7* 10.3  ?NEUTROABS 11.6*  --  15.6*  --  19.5* 21.2*  --   --   --   --   ?HGB 13.5   < > 10.0*   < > 11.7* 13.3 10.1* 8.9* 8.8* 8.0*  ?HCT 43.8   < > 31.3*   < > 37.2* 41.2 33.1* 28.6* 28.2* 25.1*  ?MCV 89.9  --  89.4   < > 90.3 87.3 94.0 92.0 91.9 88.1  ?PLT 201  --  152   < > PLATELET CLUMPS NOTED ON SMEAR, UNABLE TO ESTIMATE 107* 148* 143* 136* 124*  ? < > = values in this interval not displayed.  ? ? ?Basic Metabolic Panel: ?Recent Labs  ?Lab 06/02/21 ?0309 06/03/21 ?[id number] 06/03/21 ?1344 06/03/21 ?1701 06/04/21 ?[id number] 06/04/21 ?1717 06/03/2021 ?0242 06/17/2021 ?[id number] 06/06/21 ?0530  ?NA 134* 136  --   --  138  --  137  --  138  ?K 3.3* 3.7  --   --  4.5  --  5.6* 4.3 3.7  ?CL 96* 107  --   --  111  --  107  --  97*  ?CO2 29 23  --   --  22  --  24  --  31  ?GLUCOSE 85 102*  --   --  128*  --  129*  --  104*  ?BUN 11 12  --   --  11  --  8  --  8  ?CREATININE 1.36* 1.20  --   --  1.11  --  1.19  --  1.48*  ?CALCIUM 8.1* 7.6*  --   --  7.5*  --  7.7*  --  8.5*  ?MG 1.6* 2.0 2.1 1.8 1.7 1.9  --  1.7  --   ?PHOS 2.9 2.0* 1.9* 3.2 3.6 3.7  --   --   --   ? ?GFR: ?Estimated Creatinine Clearance: 45 mL/min (A) (by C-G formula based on SCr of 1.48 mg/dL (H)). ?Recent Labs  ?Lab 06/01/21 ?1012 06/01/21 ?2320 06/02/21 ?0020 06/02/21 ?0309 06/03/21 ?[id number] 06/03/21 ?[id number] 06/04/21 ?[id number] 06/12/2021 ?0401 06/06/21 ?0530  ?PROCALCITON 6.12  --   --  7.11 7.10  --   --   --   --   ?WBC  --    < >  --  23.3* 26.3*  --  19.3* 14.7* 10.3  ?LATICACIDVEN 3.1*  --  2.5* 2.1* 2.5* 2.2*  --   --   --   ? < > = values in this interval not displayed.  ? ? ?Liver Function Tests: ?Recent Labs  ?Lab 05/18/2021 ?1813 05/31/21 ?1048 05/31/21 ?1818 06/02/21 ?0309 06/03/21 ?[id number]  ?AST 16 17  --  20  --   ?ALT 7 8  --  7  --   ?ALKPHOS 90 50  --  66  --   ?BILITOT 0.6  0.5  --  0.6  --   ?PROT 8.6* 6.1*  --  6.2*  --   ?ALBUMIN 4.6 2.9* 2.7* 2.7* 2.5*  ? ?Recent Labs  ?Lab 05/31/21 ?1048 06/03/21 ?[id number]  ?LIPASE 19 19  ? ?Recent Labs  ?Lab 06/02/21 ?0819  ?AMMONIA 16  ? ? ?ABG

## 2021-06-06 NOTE — Progress Notes (Addendum)
[facility] ?Progress Note ? ?1 Day Post-Op  ?Subjective: ?CC-  ?Sedated on the vent. On 12 of levo. Events over night noted. On TFs at 40cc/hr.  Tolerating well at this point. ? ?Objective: ?Vital signs in last 24 hours: ?Temp:  [93.7 ?F (34.3 ?C)-98.4 ?F (36.9 ?C)] 98.4 ?F (36.9 ?C) (04/06 0258) ?Pulse Rate:  [87-194] 137 (04/06 0855) ?Resp:  [18-42] 33 (04/06 0855) ?BP: (63-149)/(38-80) 99/61 (04/06 0850) ?SpO2:  [91 %-100 %] 96 % (04/06 0855) ?Arterial Line BP: (61-172)/(38-79) 121/71 (04/06 0855) ?FiO2 (%):  [40 %-50 %] 40 % (04/06 0731) ?Weight:  [60 kg] 60 kg (04/06 0500) ?Last BM Date : 06/02/21 ? ?Intake/Output from previous day: ?04/05 0701 - 04/06 0700 ?In: 2517.9 [I.V.:1059; NG/GT:662; IV Piggyback:797] ?Out: 9700 [Urine:9700] ?Intake/Output this shift: ?Total I/O ?In: 92.2 [I.V.:92.2] ?Out: 500 [Urine:500] ? ?PE: ?Gen:  sedated on the vent ?Card:  tachy ?Abd: soft, ND, does not appear tender ? ?Lab Results:  ?Recent Labs  ?  06/26/2021 ?0401 06/06/21 ?0530  ?WBC 14.7* 10.3  ?HGB 8.8* 8.0*  ?HCT 28.2* 25.1*  ?PLT 136* 124*  ? ?BMET ?Recent Labs  ?  06/19/2021 ?0242 06/24/2021 ?0958 06/06/21 ?0530  ?NA 137  --  138  ?K 5.6* 4.3 3.7  ?CL 107  --  97*  ?CO2 24  --  31  ?GLUCOSE 129*  --  104*  ?BUN 8  --  8  ?CREATININE 1.19  --  1.48*  ?CALCIUM 7.7*  --  8.5*  ? ?PT/INR ?Recent Labs  ?  06/27/2021 ?0958  ?LABPROT 18.0*  ?INR 1.5*  ? ?CMP  ?   ?Component Value Date/Time  ? NA 138 06/06/2021 0530  ? K 3.7 06/06/2021 0530  ? CL 97 (L) 06/06/2021 0530  ? CO2 31 06/06/2021 0530  ? GLUCOSE 104 (H) 06/06/2021 0530  ? BUN 8 06/06/2021 0530  ? CREATININE 1.48 (H) 06/06/2021 0530  ? CALCIUM 8.5 (L) 06/06/2021 0530  ? PROT 6.2 (L) 06/02/2021 0309  ? ALBUMIN 2.5 (L) 06/03/2021 0528  ? AST 20 06/02/2021 0309  ? ALT 7 06/02/2021 0309  ? ALKPHOS 66 06/02/2021 0309  ? BILITOT 0.6 06/02/2021 0309  ? GFRNONAA 54 (L) 06/06/2021 0530  ? ?Lipase  ?   ?Component Value Date/Time  ? LIPASE 19 06/03/2021 0528   ? ? ? ? ? ?Studies/Results: ?MR BRAIN WO CONTRAST ? ?Result Date: 06/10/2021 ?CLINICAL DATA:  Neuro deficit, acute, stroke suspected. Generalized seizure. EXAM: MRI HEAD WITHOUT CONTRAST TECHNIQUE: Multiplanar, multiecho pulse sequences of the brain and surrounding structures were obtained without intravenous contrast. COMPARISON:  None. FINDINGS: Brain: Diffusion imaging shows a total of 5 or 6 1 cm or smaller infarctions affecting the cortex in the right frontal, parietal and occipital lobes. Similar but less numerous infarctions are noted in the right parietal region. The most likely explanation is that of watershed infarctions. Micro embolic infarctions are possible. There is an old small cortical infarction at the right frontoparietal vertex which does not show acute restricted diffusion. No large vessel territory infarction. The brainstem and cerebellum are normal. No evidence of pre-existing small-vessel disease. No hemorrhage, hydrocephalus or extra-axial collection. Vascular: Major vessels at the base of the brain show flow. Skull and upper cervical spine: Negative Sinuses/Orbits: Clear/normal Other: None IMPRESSION: Several acute subcentimeter cortical infarctions affecting both hemispheres as outlined above, most consistent with watershed infarctions. Micro embolic infarctions are possible but seemingly less likely. No large confluent infarction, swelling or hemorrhage. 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 Electronically Signed   By: Nelson Chimes M.D.   On: 06/26/2021 13:06  ? ?DG CHEST PORT 1 VIEW ? ?Result Date: 06/06/2021 ?CLINICAL DATA:  Shortness of breath and severe sepsis EXAM: PORTABLE CHEST 1 VIEW COMPARISON:  06/24/2021 FINDINGS: Endotracheal tube with tip at the clavicular heads. The enteric tube tip and side port at the level of a gastric pull-through. Right PICC with tip at the upper cavoatrial junction. Dense bilateral airspace disease. Evidence of lateral pleural  fluid at the left base. Cardiomegaly and vascular pedicle widening. IMPRESSION: 1. Unchanged bilateral pneumonia. 2. New enteric tube with tip and side port at the gastric pull-through. Electronically Signed   By: Jorje Guild M.D.   On: 06/06/2021 06:05  ? ?DG Chest Port 1 View ? ?Result Date: 06/10/2021 ?CLINICAL DATA:  Respiratory failure. EXAM: PORTABLE CHEST 1 VIEW COMPARISON:  06/04/2021 FINDINGS: The endotracheal tube is 3 cm above the carina. New right PICC line in good position with its tip near the cavoatrial junction. Persistent diffuse interstitial and airspace process in the lungs and small bilateral pleural effusions. Stable large hiatal hernia. IMPRESSION: 1. New right PICC line in good position. 2. Persistent diffuse interstitial and airspace process and small effusions. Electronically Signed   By: Marijo Sanes M.D.   On: 06/06/2021 07:45  ? ?EEG adult ? ?Result Date: 06/06/2021 ?Lora Havens, MD     06/06/2021  8:41 AM Patient Name: Colin Hamilton MRN: [id number] Epilepsy Attending: Lora Havens Referring Physician/Provider: Greta Doom, MD Date: 06/07/2021 Duration: 22.37 mins Patient history: 61 year old male with new onset seizures in the setting of severe hypotension/blood pressure fluctuation. EEG to evaluate for seizure Level of alertness: comatose AEDs during EEG study: LEV, Propofol Technical aspects: This EEG study was done with scalp electrodes positioned according to the 10-20 International system of electrode placement. Electrical activity was acquired at a sampling rate of 500Hz  and reviewed with a high frequency filter of 70Hz  and a low frequency filter of 1Hz . EEG data were recorded continuously and digitally stored. Description: EEG showed burst suppression pattern with bursts of asynchronous low amplitude 7 to 8 Hz alpha activity admixed with 15-18hz  beta activity lasting 1 to 2 seconds alternating with generalized EEG suppression lasting 3 to 8 seconds.   Hyperventilation and photic stimulation were not performed.   ABNORMALITY -Burst suppression, generalized IMPRESSION: This study is suggestive of profound diffuse encephalopathy, nonspecific etiology which could be secondary to sedation.  No seizures or epileptiform discharges were seen throughout the recording. Priyanka Barbra Sarks  ? ?Overnight EEG with video ? ?Result Date: 06/06/2021 ?Lora Havens, MD     06/06/2021  9:13 AM Patient Name: Colin Hamilton MRN: [id number] Epilepsy Attending: Lora Havens Referring Physician/Provider: Amie Portland, MD Duration: 06/29/2021 2228 to 06/06/2021 0910  Patient history: 61 year old male with new onset seizures in the setting of severe hypotension/blood pressure fluctuation. EEG to evaluate for seizure  Level of alertness: comatose  AEDs during EEG study: LEV, Propofol  Technical aspects: This EEG study was done with scalp electrodes positioned according to the 10-20 International system of electrode placement. Electrical activity was acquired at a sampling rate of 500Hz  and reviewed with a high frequency filter of 70Hz  and a low frequency filter of 1Hz . EEG data were recorded continuously and digitally stored.  Description: EEG showed continuous generalized polymorphic mixed frequencies with predominantly 6 to 8 Hz theta and alpha activity admixed with low amplitude 2 to  3 Hz delta slowing.  Hyperventilation and photic stimulation were not performed.   Event button was pressed on 06/06/2021 at [place], 2353 and on 06/03/2021 at [place], [place].  Patient was noted to have left gaze deviation during oral care each time concomitant EEG before, during and after the event did not show any EEG changes suggest seizure.  ABNORMALITY -Continuous slow, generalized  IMPRESSION: This study is suggestive of severe diffuse encephalopathy, nonspecific etiology. No seizures or epileptiform discharges were seen throughout the recording. Multiple events were recorded as described above during which  patient was noted to have left gaze deviation without concomitant EEG change. However, focal motor seizure may not be seen on scalp EEG. Therefore, clinical correlation is recommended.  Priyanka Barbra Sarks   ? ?Rapid EEG ? ?Result Date:

## 2021-06-06 NOTE — Procedures (Signed)
Patient Name: Colin Hamilton  ?MRN: [id number]  ?Epilepsy Attending: Charlsie Quest  ?Referring Physician/Provider: Rejeana Brock, MD ?Date: 06/26/2021 ?Duration: 22.37 mins ? ?Patient history: 61 year old male with new onset seizures in the setting of severe hypotension/blood pressure fluctuation. EEG to evaluate for seizure ? ?Level of alertness: comatose ? ?AEDs during EEG study: LEV, Propofol ? ?Technical aspects: This EEG study was done with scalp electrodes positioned according to the 10-20 International system of electrode placement. Electrical activity was acquired at a sampling rate of 500Hz  and reviewed with a high frequency filter of 70Hz  and a low frequency filter of 1Hz . EEG data were recorded continuously and digitally stored.  ? ?Description: EEG showed burst suppression pattern with bursts of asynchronous low amplitude 7 to 8 Hz alpha activity admixed with 15-18hz  beta activity lasting 1 to 2 seconds alternating with generalized EEG suppression lasting 3 to 8 seconds.  Hyperventilation and photic stimulation were not performed.    ? ?ABNORMALITY ?-Burst suppression, generalized ? ?IMPRESSION: ?This study is suggestive of profound diffuse encephalopathy, nonspecific etiology which could be secondary to sedation.  No seizures or epileptiform discharges were seen throughout the recording. ? ?  ? ?

## 2021-06-06 NOTE — TOC CAGE-AID Note (Signed)
Transition of Care (TOC) - CAGE-AID Screening ? ? ?Patient Details  ?Name: Colin Hamilton ?MRN: [id number] ?Date of Birth: [birth year] ? ?Transition of Care (TOC) CM/SW Contact:    ?Colin Hamilton, LCSWA ?Phone Number: ?06/06/2021, 10:46 AM ? ? ?Clinical Narrative: ?Pt is unable to participate in Cage Aid. ?Pt is comatose. ? ?Insurance underwriter, MSW, LCSW-A ?Pronouns:  She/Her/Hers ?[facility] of [facility] ?Clinical Social Worker ?Direct Number:  [phone] ?[name] ? ?CAGE-AID Screening: ?Substance Abuse Screening unable to be completed due to: : Patient unable to participate ? ?  ?  ?  ?  ?  ? ?  ? ?  ? ? ? ? ? ? ?

## 2021-06-06 NOTE — Progress Notes (Signed)
RN and RT transported pt down to CT scan and back to 54m05 without complication.  ?

## 2021-06-06 NOTE — Procedures (Addendum)
Patient Name: Colin Hamilton  ?MRN: [id number]  ?Epilepsy Attending: Charlsie Quest  ?Referring Physician/Provider: Milon Dikes, MD ?Duration: 06/01/2021 2228 to 06/06/2021 2228 ?  ?Patient history: 61 year old male with new onset seizures in the setting of severe hypotension/blood pressure fluctuation. EEG to evaluate for seizure ?  ?Level of alertness: comatose ?  ?AEDs during EEG study: LEV, Propofol ?  ?Technical aspects: This EEG study was done with scalp electrodes positioned according to the 10-20 International system of electrode placement. Electrical activity was acquired at a sampling rate of 500Hz  and reviewed with a high frequency filter of 70Hz  and a low frequency filter of 1Hz . EEG data were recorded continuously and digitally stored.  ?  ?Description: EEG showed continuous generalized polymorphic mixed frequencies with predominantly 6 to 8 Hz theta and alpha activity admixed with low amplitude 2 to 3 Hz delta slowing.  Hyperventilation and photic stimulation were not performed.    ? ?Event button was pressed on 06/24/2021 at 2321, 2353 and on 06/03/2021 at 0051, 0354.  Patient was noted to have left gaze deviation during oral care each time concomitant EEG before, during and after the event did not show any EEG changes suggest seizure. ?  ?ABNORMALITY ?-Continuous slow, generalized ?  ?IMPRESSION: ?This study is suggestive of severe diffuse encephalopathy, nonspecific etiology. No seizures or epileptiform discharges were seen throughout the recording. ? ?Multiple events were recorded as described above during which patient was noted to have left gaze deviation without concomitant EEG change. However, focal motor seizure may not be seen on scalp EEG. Therefore, clinical correlation is recommended.  ?  ?08/05/2021  ?  ?

## 2021-06-06 NOTE — Progress Notes (Signed)
EEG maintenance performed.  No skin breakdown observed at electrode sites Fp1, Fp2. 

## 2021-06-06 NOTE — Progress Notes (Addendum)
STROKE TEAM PROGRESS NOTE  ? ?HISTORY OF PRESENT ILLNESS (per record) ?Colin PeroneOscar Hamilton is a 61 y.o. male with a history of asthma, htn who was admitted on 3/30 with SOB and generalized weakness. SOB started during colonoscopy, then continued to get worse. He was febrile and treated for sepsis and started on bipap. BP at 6am just prior to the seizure was 60 systolic.  While I was in the room evaluating the patient, his blood pressure dropped from 115 systolic to 60 systolic with no definite cause.  He has been started on propofol but been on that for about 45 minutes at the time of blood pressure drop. ?LKW: unclear ?tpa given?: no, unclear LKW due to sedation ? ? ?INTERVAL HISTORY ?His RN is at the bedside.  Patient had Tonie gaze deviation to the left on my exam and some focal twitching of the left great toe.  He was given IV Ativan 1 mg which resulted in resolution of the chronic leftward eye deviation with eyes looking to the right and cessation of "jerking.  Patient was on bedside continuous EEG monitoring which did not show convincing seizures.  Overnight continuous EEG monitoring shows severe diffuse encephalopathy with nonspecific etiology but no definite epileptiform features ?MRI scan of the brain shows small bilateral cortical and a few subcortical infarcts in watershed like distribution. ?CT angiogram of the brain done this morning does not show any large vessel stenosis or occlusion. ? ?OBJECTIVE ?Vitals:  ? 06/06/21 0845 06/06/21 [id number] 06/06/21 0850 06/06/21 0855  ?BP: 116/60 116/60 99/61   ?Pulse: (!) 140 (!) 139 (!) 137 (!) 137  ?Resp: (!) 33 (!) 33 (!) 33 (!) 33  ?Temp:      ?TempSrc:      ?SpO2: 95% 95% 96% 96%  ?Weight:      ?Height:      ? ? ?CBC:  ?Recent Labs  ?Lab 06/01/21 ?2320 06/02/21 ?0309 06/03/21 ?[id number] 06/12/2021 ?0401 06/06/21 ?0530  ?WBC 21.9* 23.3*   < > 14.7* 10.3  ?NEUTROABS 19.5* 21.2*  --   --   --   ?HGB 11.7* 13.3   < > 8.8* 8.0*  ?HCT 37.2* 41.2   < > 28.2* 25.1*  ?MCV 90.3 87.3    < > 91.9 88.1  ?PLT PLATELET CLUMPS NOTED ON SMEAR, UNABLE TO ESTIMATE 107*   < > 136* 124*  ? < > = values in this interval not displayed.  ? ? ?Basic Metabolic Panel:  ?Recent Labs  ?Lab 06/04/21 ?[id number] 06/04/21 ?1717 06/03/2021 ?0242 06/20/2021 ?[id number] 06/06/21 ?0530  ?NA 138  --  137  --  138  ?K 4.5  --  5.6* 4.3 3.7  ?CL 111  --  107  --  97*  ?CO2 22  --  24  --  31  ?GLUCOSE 128*  --  129*  --  104*  ?BUN 11  --  8  --  8  ?CREATININE 1.11  --  1.19  --  1.48*  ?CALCIUM 7.5*  --  7.7*  --  8.5*  ?MG 1.7 1.9  --  1.7  --   ?PHOS 3.6 3.7  --   --   --   ? ? ?Lipid Panel:  ?   ?Component Value Date/Time  ? TRIG 173 (H) 06/06/2021 0530  ? ?HgbA1c:  ?Lab Results  ?Component Value Date  ? HGBA1C 5.4 06/05/2020  ? ?Urine Drug Screen:  ?   ?Component Value Date/Time  ? LABOPIA POSITIVE (A) 06/01/2021  1212  ? COCAINSCRNUR NONE DETECTED 06/01/2021 1212  ? LABBENZ NONE DETECTED 06/01/2021 1212  ? AMPHETMU NONE DETECTED 06/01/2021 1212  ? THCU NONE DETECTED 06/01/2021 1212  ? LABBARB NONE DETECTED 06/01/2021 1212  ?  ?Alcohol Level No results found for: ETH ? ?IMAGING ? ?CT ANGIO HEAD NECK W WO CM ? ?Result Date: 06/06/2021 ?CLINICAL DATA:  Stroke follow-up EXAM: CT ANGIOGRAPHY HEAD AND NECK TECHNIQUE: Multidetector CT imaging of the head and neck was performed using the standard protocol during bolus administration of intravenous contrast. Multiplanar CT image reconstructions and MIPs were obtained to evaluate the vascular anatomy. Carotid stenosis measurements (when applicable) are obtained utilizing NASCET criteria, using the distal internal carotid diameter as the denominator. RADIATION DOSE REDUCTION: This exam was performed according to the departmental dose-optimization program which includes automated exposure control, adjustment of the mA and/or kV according to patient size and/or use of iterative reconstruction technique. CONTRAST:  39mL OMNIPAQUE IOHEXOL 350 MG/ML SOLN COMPARISON:  Brain MRI from yesterday FINDINGS:  CT HEAD FINDINGS Brain: Known acute infarcts are subtle compared to prior brain MRI. No gross progression and no hemorrhage. Vascular: See below Skull: Negative Sinuses: Negative Orbits: Negative Review of the MIP images confirms the above findings CTA NECK FINDINGS Aortic arch: Atheromatous plaque and elongation. No acute finding or dilatation. Right carotid system: Mixed density plaque at the bifurcation and proximal ICA without stenosis or ulceration. Left carotid system: Atheromatous plaque centered at the bifurcation, mixed density. No stenosis or ulceration. Vertebral arteries: No proximal subclavian stenosis. Motion artifact affects both proximal vertebral arteries with no suspected stenosis and no beading or dissection flap seen. Skeleton: No acute or aggressive finding Other neck: Calcification and scar-like appearance anterior to the distorted trachea at the thoracic inlet, suspect prior tracheostomy. Upper chest: Extensive ground-glass and consolidative opacity in the upper lungs with small fissural pleural fluid on the left. Review of the MIP images confirms the above findings CTA HEAD FINDINGS Anterior circulation: No significant stenosis, proximal occlusion, aneurysm, or vascular malformation. Mild atheromatous calcification of the carotid siphon Posterior circulation: The vertebral and basilar arteries are smoothly contoured and diffusely patent. No branch occlusion, beading, or flow limiting stenosis. Mild distal PCA atheromatous irregularity is suspected. Venous sinuses: Diffusely patent Anatomic variants: Aplastic left A1 segment Review of the MIP images confirms the above findings IMPRESSION: No emergent arterial finding. There is atherosclerosis without major vessel flow limiting stenosis or detected embolic source. Acute infarcts are underestimated relative to prior brain MRI. No hemorrhage or evidence of progression. Electronically Signed   By: Tiburcio Pea M.D.   On: 06/06/2021 10:15  ? ?MR  BRAIN WO CONTRAST ? ?Result Date: 2021-07-03 ?CLINICAL DATA:  Neuro deficit, acute, stroke suspected. Generalized seizure. EXAM: MRI HEAD WITHOUT CONTRAST TECHNIQUE: Multiplanar, multiecho pulse sequences of the brain and surrounding structures were obtained without intravenous contrast. COMPARISON:  None. FINDINGS: Brain: Diffusion imaging shows a total of 5 or 6 1 cm or smaller infarctions affecting the cortex in the right frontal, parietal and occipital lobes. Similar but less numerous infarctions are noted in the right parietal region. The most likely explanation is that of watershed infarctions. Micro embolic infarctions are possible. There is an old small cortical infarction at the right frontoparietal vertex which does not show acute restricted diffusion. No large vessel territory infarction. The brainstem and cerebellum are normal. No evidence of pre-existing small-vessel disease. No hemorrhage, hydrocephalus or extra-axial collection. Vascular: Major vessels at the base of the brain show flow. Skull  and upper cervical spine: Negative Sinuses/Orbits: Clear/normal Other: None IMPRESSION: Several acute subcentimeter cortical infarctions affecting both hemispheres as outlined above, most consistent with watershed infarctions. Micro embolic infarctions are possible but seemingly less likely. No large confluent infarction, swelling or hemorrhage. 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 Electronically Signed   By: Paulina Fusi M.D.   On: June 18, 2021 13:06  ? ?DG CHEST PORT 1 VIEW ? ?Result Date: 06/06/2021 ?CLINICAL DATA:  Shortness of breath and severe sepsis EXAM: PORTABLE CHEST 1 VIEW COMPARISON:  2021/06/18 FINDINGS: Endotracheal tube with tip at the clavicular heads. The enteric tube tip and side port at the level of a gastric pull-through. Right PICC with tip at the upper cavoatrial junction. Dense bilateral airspace disease. Evidence of lateral pleural fluid at the left base.  Cardiomegaly and vascular pedicle widening. IMPRESSION: 1. Unchanged bilateral pneumonia. 2. New enteric tube with tip and side port at the gastric pull-through. Electronically Signed   By: Audry Riles.D.

## 2021-06-06 NOTE — Progress Notes (Signed)
Nutrition Follow-up ? ?DOCUMENTATION CODES:  ? ?Not applicable ? ?INTERVENTION:  ? ?Continue tube feeding via NG tube: ?Vital 1.5 at 40 ml/h (960 ml per day) ?Decrease Prosource TF to 90 ml BID ? ?Provides 1600 kcal (2001 kcal total with propofol), 109 gm protein, 733 ml free water daily. ? ?Continue MVI with minerals daily via tube. ? ?NUTRITION DIAGNOSIS:  ? ?Inadequate oral intake related to inability to eat as evidenced by NPO status. ? ?Ongoing  ? ?GOAL:  ? ?Patient will meet greater than or equal to 90% of their needs ? ?Met with TF ? ?MONITOR:  ? ?Vent status, TF tolerance, Weight trends, Labs, I & O's ? ?REASON FOR ASSESSMENT:  ? ?Ventilator, Consult ?Enteral/tube feeding initiation and management ? ?ASSESSMENT:  ? ?61 y.o. male with medical history of HTN, asthma, GERD, anxiety, hx of alcohol abuse, and Barrett's esophagus s/p esophagectomy with gastric pull through. He presented to the ED due to dyspnea following a colonoscopy the day prior. He was also experiencing abdominal pain after the procedure. He was admitted with severe sepsis due to aspiration pneumonia. ? ?Discussed patient in ICU rounds and with RN today. ?NG tube was placed by GI surgeon 4/5.  ?Currently receiving Vital 1.5 at 40 ml/h with Prosource TF 90 ml TID. Tolerating well at goal rate.  ? ?Patient is currently intubated on ventilator support ?MV: 13.1 L/min ?Temp (24hrs), Avg:95.9 ?F (35.5 ?C), Min:93.7 ?F (34.3 ?C), Max:98.4 ?F (36.9 ?C) ? ?Propofol: 15.2 ml/hr providing 401 kcal from lipid. ? ?Labs reviewed.  ?CBG: 112-118 ? ?Medications reviewed and include Colace, folic acid, MVI with minerals, Miralax, thiamine, neosynephrine, propofol. Levophed has been stopped. ? ?Admission weight 55.3 kg ?Weight 60 kg today ?I/O +1.8 L since admission ? ?Diet Order:   ?Diet Order   ? ?       ?  Diet NPO time specified  Diet effective midnight       ?  ? ?  ?  ? ?  ? ? ?EDUCATION NEEDS:  ? ?No education needs have been identified at this  time ? ?Skin:  Skin Assessment: Reviewed RN Assessment ? ?Last BM:  4/2 (type 7, medium amount) ? ?Height:  ? ?Ht Readings from Last 1 Encounters:  ?06/02/21 _0  (1.626 m)  ? ? ?Weight:  ? ?Wt Readings from Last 1 Encounters:  ?06/06/21 60 kg  ? ? ?Ideal Body Weight:  59.1 kg ? ?BMI:  Body mass index is 22.71 kg/m?. ? ?Estimated Nutritional Needs:  ? ?Kcal:  1600-1800 ? ?Protein:  90-110 gm ? ?Fluid:  >/= 1.8 L ? ? ? ?Lucas Mallow RD, LDN, CNSC ?Please refer to Amion for contact information.                                                       ? ?

## 2021-06-07 DIAGNOSIS — R6520 Severe sepsis without septic shock: Secondary | ICD-10-CM | POA: Diagnosis not present

## 2021-06-07 DIAGNOSIS — A419 Sepsis, unspecified organism: Secondary | ICD-10-CM | POA: Diagnosis not present

## 2021-06-07 DIAGNOSIS — G9341 Metabolic encephalopathy: Secondary | ICD-10-CM | POA: Diagnosis not present

## 2021-06-07 LAB — CBC
HCT: 28.4 % — ABNORMAL LOW (ref 39.0–52.0)
Hemoglobin: 9 g/dL — ABNORMAL LOW (ref 13.0–17.0)
MCH: 27.9 pg (ref 26.0–34.0)
MCHC: 31.7 g/dL (ref 30.0–36.0)
MCV: 87.9 fL (ref 80.0–100.0)
Platelets: 199 10*3/uL (ref 150–400)
RBC: 3.23 MIL/uL — ABNORMAL LOW (ref 4.22–5.81)
RDW: 18.1 % — ABNORMAL HIGH (ref 11.5–15.5)
WBC: 16.9 10*3/uL — ABNORMAL HIGH (ref 4.0–10.5)
nRBC: 0.2 % (ref 0.0–0.2)

## 2021-06-07 LAB — LIPID PANEL
Cholesterol: 80 mg/dL (ref 0–200)
HDL: <10 mg/dL — ABNORMAL LOW (ref >40)
Triglycerides: 248 mg/dL — ABNORMAL HIGH (ref <150)
VLDL: 50 mg/dL — ABNORMAL HIGH (ref 0–40)

## 2021-06-07 LAB — BASIC METABOLIC PANEL
Anion gap: 8 (ref 5–15)
BUN: 16 mg/dL (ref 6–20)
CO2: 29 mmol/L (ref 22–32)
Calcium: 8.2 mg/dL — ABNORMAL LOW (ref 8.9–10.3)
Chloride: 102 mmol/L (ref 98–111)
Creatinine, Ser: 1.55 mg/dL — ABNORMAL HIGH (ref 0.61–1.24)
GFR, Estimated: 51 mL/min — ABNORMAL LOW (ref >60)
Glucose, Bld: 110 mg/dL — ABNORMAL HIGH (ref 70–99)
Potassium: 3.1 mmol/L — ABNORMAL LOW (ref 3.5–5.1)
Sodium: 139 mmol/L (ref 135–145)

## 2021-06-07 LAB — HEMOGLOBIN A1C
Hgb A1c MFr Bld: 5.2 % (ref 4.8–5.6)
Mean Plasma Glucose: 102.54 mg/dL

## 2021-06-07 LAB — GLUCOSE, CAPILLARY
Glucose-Capillary: 112 mg/dL — ABNORMAL HIGH (ref 70–99)
Glucose-Capillary: 121 mg/dL — ABNORMAL HIGH (ref 70–99)
Glucose-Capillary: 119 mg/dL — ABNORMAL HIGH (ref 70–99)
Glucose-Capillary: 107 mg/dL — ABNORMAL HIGH (ref 70–99)
Glucose-Capillary: 132 mg/dL — ABNORMAL HIGH (ref 70–99)

## 2021-06-07 LAB — LDL CHOLESTEROL, DIRECT: Direct LDL: 31.1 mg/dL (ref 0–99)

## 2021-06-07 LAB — MAGNESIUM: Magnesium: 1.5 mg/dL — ABNORMAL LOW (ref 1.7–2.4)

## 2021-06-07 MED ORDER — POTASSIUM CHLORIDE 10 MEQ/50ML IV SOLN
10.0000 meq | INTRAVENOUS | Status: AC
  Administered 2021-06-07 (×4): 10 meq via INTRAVENOUS
  Filled 2021-06-07 (×4): qty 50

## 2021-06-07 MED ORDER — MAGNESIUM SULFATE 4 GM/100ML IV SOLN
4.0000 g | Freq: Once | INTRAVENOUS | Status: AC
  Administered 2021-06-07: 4 g via INTRAVENOUS
  Filled 2021-06-07: qty 100

## 2021-06-07 MED ORDER — PROPOFOL 1000 MG/100ML IV EMUL
5.0000 ug/kg/min | INTRAVENOUS | Status: AC
  Administered 2021-06-07 – 2021-06-08 (×4): 40 ug/kg/min via INTRAVENOUS
  Filled 2021-06-07 (×4): qty 100

## 2021-06-07 MED ORDER — LACTULOSE 10 GM/15ML PO SOLN: 5.0000 g | Freq: Three times a day (TID) | ORAL | Status: DC

## 2021-06-07 MED ORDER — POTASSIUM CHLORIDE 20 MEQ PO PACK
20.0000 meq | PACK | ORAL | Status: AC
  Administered 2021-06-07 (×2): 20 meq
  Filled 2021-06-07 (×2): qty 1

## 2021-06-07 MED ORDER — PANTOPRAZOLE 2 MG/ML SUSPENSION
40.0000 mg | Freq: Two times a day (BID) | ORAL | Status: DC
  Administered 2021-06-07 – 2021-06-12 (×11): 40 mg
  Filled 2021-06-07 (×11): qty 20

## 2021-06-07 MED ORDER — ASPIRIN 81 MG PO CHEW
81.0000 mg | CHEWABLE_TABLET | Freq: Every day | ORAL | Status: DC
  Administered 2021-06-07 – 2021-06-13 (×7): 81 mg
  Filled 2021-06-07 (×8): qty 1

## 2021-06-07 MED ORDER — SODIUM CHLORIDE 0.9 % IV SOLN
150.0000 mg | Freq: Two times a day (BID) | INTRAVENOUS | Status: DC
  Administered 2021-06-07 – 2021-06-13 (×12): 150 mg via INTRAVENOUS
  Filled 2021-06-07 (×13): qty 15

## 2021-06-07 MED ORDER — LACTULOSE 10 GM/15ML PO SOLN
10.0000 g | Freq: Every day | ORAL | Status: DC
  Administered 2021-06-07: 10 g
  Filled 2021-06-07: qty 15

## 2021-06-07 NOTE — Progress Notes (Signed)
Patient had an apparent seizure.  Disconjugate gaze and Left focus.  Also had BM and Urinated.  No noticeable body movements.  Also became suddenly hypertensive with Systolic pressure going from 120's to 180's.  Gave 2 mg of Ativan.  Lasted about 2 min.  ?

## 2021-06-07 NOTE — Progress Notes (Signed)
? ?NAME:  Colin Hamilton, MRN:  [id number], DOB:  05/17/1960, LOS: 7 ?ADMISSION DATE:  05/29/2021, CONSULTATION DATE:  06/04/2021 ?REFERRING MD: Cyndia Skeeters, TRH , CHIEF COMPLAINT:  resp distress    ? ?History of Present Illness:  ? ?Colin Hamilton is a 44M with barrett's esophagus, esophagectomy with gastric pull through, atrial fibrillation, HTN, asthma, and alcohol abuse who was admitted for shortness of breath and abdominal pain after colonoscopy. CT chest/abdomen/pelvis -nodular infiltrates especially in left lung concerning for aspiration pneumonia, large hiatal hernia versus gastric polyp, sigmoid diverticulosis, enhancement along right lateral bladder wall. Treated for sepsis and started on bipap. PCCM was consulted on 4/3 due to worsening respiratory distress and intubation requiring pressors. He started to have seizures on 4/5 and was transferred to [facility] for cEEG.  ? ? ?Pertinent  Medical History  ?Barrett's esophagus ?Esophagectomy with gastric pull-through ?Solitary right kidney , CKD stage III yea ?Alcohol use ?Afib ?Asthma  ?HTN ? ?Significant Hospital Events: ?Including procedures, antibiotic start and stop dates in addition to other pertinent events   ?3/30 CT chest/abdomen/pelvis -nodular infiltrates especially in left lung, large hiatal hernia versus gastric polyp, sigmoid diverticulosis, enhancement along right lateral bladder wall, started on vanc, flagyl, cefepime ?3/31 Admitted to [facility] ?4/1 abx changed to azithro, ceftriaxone, flagyl  ?4/2 ctx changed to cefepime ?4/3 PCCM consult, worsening resp distress> intubated, pressors  ?4/4 precedex added, remains on 50%/ 8 peep ?4/5 seizure; CT head/ ceribell/ MRI ordered  ?  ?3/30 SARS/ flu > neg ?3/31 MRSA PCR > neg ?3/30 UC > < 10k colonies, insignificant growth ?3/30 Bcx2 > ngtd ?3/31 urine strep> neg ?3/31 urine Legionella >  ?4/2 trach asp >candida albicans  ? ?Interim History / Subjective:  ? ?He continued to have siezures yesterday with gaze deviation to  left, and focal twitching of left 1st toe. Was already on Keppra, and Ativan 1mg  was added which resolved seizure.. Vimpat was also added per Neurology. Watershed infarcts on MRI deemed to be more likely due to seizures ? ?BP continues to be very labile, switched from Levophed to Neo yesterday ?Still sedated on propofol and requiring pressors  ? ? ?Objective   ?Blood pressure 120/82, pulse (!) 106, temperature (!) 97.2 ?F (36.2 ?C), temperature source Esophageal, resp. rate (!) 34, height 5\' 4"  (1.626 m), weight 61.9 kg, SpO2 92 %. ?   ?Vent Mode: PRVC ?FiO2 (%):  [40 %-50 %] 50 % ?Set Rate:  [26 bmp] 26 bmp ?Vt Set:  [420 mL] 420 mL ?PEEP:  [5 cmH20-8 cmH20] 5 cmH20 ?Plateau Pressure:  [13 cmH20-108 cmH20] 23 cmH20  ? ?Intake/Output Summary (Last 24 hours) at 06/07/2021 0705 ?Last data filed at 06/07/2021 0600 ?Gross per 24 hour  ?Intake 3742.59 ml  ?Output 1850 ml  ?Net 1892.59 ml  ? ?Filed Weights  ? 06/04/21 0500 06/06/21 0500 06/07/21 0500  ?Weight: 63.4 kg 60 kg 61.9 kg  ? ? ?Examination: ?General: critically ill appearing, on vent  ?Neuro: Sedated. Not responsive to stimuli ?HENT: L scleral implant with L pupil > R ?Lungs: on Vent, breath sounds course diffusely  ?Cardiovascular: RRR no murmurs  ?Abdomen: soft, non distended  ?Extremities: warm, no edema  ? ? ?Assessment & Plan:  ? ?Acute hypoxic respiratory failure ?Aspiration pneumonia/ pneumonitis  ?Likely underlying COPD versus history of asthma ?Former smoker ?Currently on mechanical ventilation  ?Repeat CXR yesterday with unchanged bilateral pneumonia. ?WBC 16.9 up from 10.3 yesterday. Has remained afebrile  ?Lasix d/c yesterday, UOP overnight 500cc  ?-  continue propofol  ?- continue CTX (day 9 total of abx) for total of 10 days ? ?Acute metabolic encephalopathy ?New onset seizures ?Watershed infarcts  ?Bedside EEG yesterday did not show convincing seizures but did show severe diffuse encephalopathy with nonspecific etiology but no definite epileptiform  features ?MRI brain yesterday showed small bilateral cortical and a few subcortical infarcts in watershed like distribution. ?CT angio of brain yesterday showed no emergent arterial finding. There is atherosclerosis without major vessel flow limiting stenosis or detected embolic source. Acute infarcts are underestimated relative to prior brain MRI. No hemorrhage or evidence of progression. ?- continue Keppra 1500 BID ?- continue Vimpat 100mg  q 12 ?- continue  propofol  ?- f/u CT angio head and neck  ? ?HFpEF  HTN ?TTE 4/2 > EF 55-60%, no wall motion abnormalities, G1DD, normal RV ?BP has been very labile. He has required pressors  ?- wean down phenylephrine  ?- permissive HTN per neuro (OK if < 220/120) but gradually normalize in 5-7 days  ? ?Abdominal pain ?Large hiatal hernia ?Sigmoid diverticulosis with concern for sigmoid mass vs diverticular stricture ?Also with developing adhesions to bladder. Colonoscopy with biopsy performed in [facility] ?NG tube in place. EGD performed showing previous surgical anastomosis, Gastritis, A few duodenal polyps. ?- GI and surgery following, appreciate continued care and recommendations ?- trophic tube feeds per GI  ? ?CKD III ?Solitary right kidney  ?Cr 1.55 down from 1.48.  Was 1.19 2 days ago. Held lasix yesterday given bump in kidney function.  ?- continue to monitor  ? ?Hypokalemia   ?K+ 3.1. Repleted with 25meq K+  ? ?Hypomagnesemia  ?Mag 1.5. Repleted with 4g  ? ?HLD  ?Triglycerides 248, HDL <10, VLDL 50 ?- continue statin at d/c  ? ?Normocytic anemia ?Hgb stable at 9.0 up from 8.0 yesterday  ?- am CBC  ? ?Goals of care ?Patient currently full code. Given worsening condition and poor prognosis, consulted palliative. Updated family and let them know about palliative consult which they were amenable to  ? ? ?Best Practice (right click and "Reselect all SmartList Selections" daily)  ? ?Diet/type: NPO ?DVT prophylaxis: LMWH ?GI prophylaxis: PPI ?Lines: Arterial Line and  yes and it is still needed ?Foley:  N/A ?Code Status:  full code ? ?Labs   ?CBC: ?Recent Labs  ?Lab 05/31/21 ?1048 06/01/21 ?0302 06/01/21 ?2320 06/02/21 ?0309 06/03/21 ?[id number] 06/04/21 ?[id number] 06/24/2021 ?0401 06/06/21 ?0530 06/07/21 ?0451  ?WBC 18.2*   < > 21.9* 23.3* 26.3* 19.3* 14.7* 10.3 16.9*  ?NEUTROABS 15.6*  --  19.5* 21.2*  --   --   --   --   --   ?HGB 10.0*   < > 11.7* 13.3 10.1* 8.9* 8.8* 8.0* 9.0*  ?HCT 31.3*   < > 37.2* 41.2 33.1* 28.6* 28.2* 25.1* 28.4*  ?MCV 89.4   < > 90.3 87.3 94.0 92.0 91.9 88.1 87.9  ?PLT 152   < > PLATELET CLUMPS NOTED ON SMEAR, UNABLE TO ESTIMATE 107* 148* 143* 136* 124* 199  ? < > = values in this interval not displayed.  ? ? ?Basic Metabolic Panel: ?Recent Labs  ?Lab 06/03/21 ?[id number] 06/03/21 ?1344 06/03/21 ?1701 06/04/21 ?[id number] 06/04/21 ?1717 06/23/2021 ?0242 06/16/2021 ?[id number] 06/06/21 ?0530 06/07/21 ?0451  ?NA 136  --   --  138  --  137  --  138 139  ?K 3.7  --   --  4.5  --  5.6* 4.3 3.7 3.1*  ?CL 107  --   --  111  --  107  --  97* 102  ?CO2 23  --   --  22  --  24  --  31 29  ?GLUCOSE 102*  --   --  128*  --  129*  --  104* 110*  ?BUN 12  --   --  11  --  8  --  8 16  ?CREATININE 1.20  --   --  1.11  --  1.19  --  1.48* 1.55*  ?CALCIUM 7.6*  --   --  7.5*  --  7.7*  --  8.5* 8.2*  ?MG 2.0 2.1 1.8 1.7 1.9  --  1.7  --   --   ?PHOS 2.0* 1.9* 3.2 3.6 3.7  --   --   --   --   ? ?GFR: ?Estimated Creatinine Clearance: 43 mL/min (A) (by C-G formula based on SCr of 1.55 mg/dL (H)). ?Recent Labs  ?Lab 06/01/21 ?1012 06/01/21 ?2320 06/02/21 ?0020 06/02/21 ?0309 06/03/21 ?[id number] 06/03/21 ?[id number] 06/04/21 ?[id number] 06/12/2021 ?0401 06/06/21 ?0530 06/07/21 ?0451  ?PROCALCITON 6.12  --   --  7.11 7.10  --   --   --   --   --   ?WBC  --    < >  --  23.3* 26.3*  --  19.3* 14.7* 10.3 16.9*  ?LATICACIDVEN 3.1*  --  2.5* 2.1* 2.5* 2.2*  --   --   --   --   ? < > = values in this interval not displayed.  ? ? ?Liver Function Tests: ?Recent Labs  ?Lab 05/31/21 ?1048 05/31/21 ?1818 06/02/21 ?0309 06/03/21 ?[id number]   ?AST 17  --  20  --   ?ALT 8  --  7  --   ?ALKPHOS 50  --  66  --   ?BILITOT 0.5  --  0.6  --   ?PROT 6.1*  --  6.2*  --   ?ALBUMIN 2.9* 2.7* 2.7* 2.5*  ? ?Recent Labs  ?Lab 05/31/21 ?1048 06/03/21 ?[id number]  ?LIPA

## 2021-06-07 NOTE — Procedures (Addendum)
Patient Name: Colin Hamilton  ?MRN: [id number]  ?Epilepsy Attending: Lora Havens  ?Referring Physician/Provider: Amie Portland, MD ?Duration: 06/06/2021 2228 to 06/07/2021 2228 ?  ?Patient history: 61 year old male with new onset seizures in the setting of severe hypotension/blood pressure fluctuation. EEG to evaluate for seizure ?  ?Level of alertness: comatose ?  ?AEDs during EEG study: LEV, LCM, Propofol ?  ?Technical aspects: This EEG study was done with scalp electrodes positioned according to the 10-20 International system of electrode placement. Electrical activity was acquired at a sampling rate of 500Hz  and reviewed with a high frequency filter of 70Hz  and a low frequency filter of 1Hz . EEG data were recorded continuously and digitally stored.  ?  ?Description: EEG showed continuous generalized low amplitude 3-7hz  theta-delta slowing.  Hyperventilation and photic stimulation were not performed.    ?  ?ABNORMALITY ?-Continuous slow, generalized ?  ?IMPRESSION: ?This study is suggestive of severe diffuse encephalopathy, nonspecific etiology. No seizures or epileptiform discharges were seen throughout the recording. ?   ?Lora Havens  ?

## 2021-06-07 NOTE — Progress Notes (Signed)
[facility] ADULT ICU REPLACEMENT PROTOCOL ? ? ?The patient does apply for the [facility] Adult ICU Electrolyte Replacment Protocol based on the criteria listed below:  ? ?1.Exclusion criteria: TCTS patients, ECMO patients, and Dialysis patients ?2. Is GFR >/= 30 ml/min? Yes.    ?Patient's GFR today is 51 ?3. Is SCr </= 2? Yes.   ?Patient's SCr is 1.55 mg/dL ?4. Did SCr increase >/= 0.5 in 24 hours? No. ?5.Pt's weight >40kg  Yes.   ?6. Abnormal electrolyte(s): K 3.1  ?7. Electrolytes replaced per protocol ? ?Ar[place]elle [place] 06/07/2021 5:29 AM  ?

## 2021-06-07 NOTE — Progress Notes (Signed)
EEG maintenance performed. No skin breakdown noted. Patients leads A1 and Fp1 were fixed.  ?

## 2021-06-07 NOTE — Progress Notes (Addendum)
STROKE TEAM PROGRESS NOTE  ? ? ?INTERVAL HISTORY ?His RN is at the bedside.  His condition appears to have been declined.  He is in respiratory distress and tachycardic with heart rate in the 150s.  He is requiring fentanyl and propofol l for sedation.  He remains comatose with gaze appears midline, and left gaze deviation has resolved.  No blink to threat or tracking noted. . Vimpat and Keppra for seizures. Continuous EEG remains in place and shows diffuse encephalopathy but no seizures.. HR 150s and BP 110-130s with stimulation. Patient does not withdraw to pain or respond to sternal rub on exam.  ?Overnight continuous EEG monitoring shows severe diffuse encephalopathy with nonspecific etiology but no definite epileptiform features ?MRI scan of the brain shows small bilateral cortical and a few subcortical infarcts in watershed like distribution.  However CT angiogram of the brain  does not show any large vessel stenosis or occlusion. ? ?OBJECTIVE ?Vitals:  ? 06/07/21 0830 06/07/21 0845 06/07/21 1100 06/07/21 1230  ?BP: 117/69 127/71  134/83  ?Pulse: (!) 109 (!) 106  (!) 123  ?Resp: (!) 36 (!) 37  (!) 29  ?Temp:   98.8 ?F (37.1 ?C) 97.9 ?F (36.6 ?C)  ?TempSrc:   Esophageal   ?SpO2: 94% 94%  95%  ?Weight:      ?Height:      ? ? ?CBC:  ?Recent Labs  ?Lab 06/01/21 ?2320 06/02/21 ?0309 06/03/21 ?[id number] 06/06/21 ?0530 06/07/21 ?0451  ?WBC 21.9* 23.3*   < > 10.3 16.9*  ?NEUTROABS 19.5* 21.2*  --   --   --   ?HGB 11.7* 13.3   < > 8.0* 9.0*  ?HCT 37.2* 41.2   < > 25.1* 28.4*  ?MCV 90.3 87.3   < > 88.1 87.9  ?PLT PLATELET CLUMPS NOTED ON SMEAR, UNABLE TO ESTIMATE 107*   < > 124* 199  ? < > = values in this interval not displayed.  ? ? ? ?Basic Metabolic Panel:  ?Recent Labs  ?Lab 06/04/21 ?[id number] 06/04/21 ?1717 06/26/2021 ?0242 06/24/2021 ?[id number] 06/06/21 ?0530 06/07/21 ?0451  ?NA 138  --    < >  --  138 139  ?K 4.5  --    < > 4.3 3.7 3.1*  ?CL 111  --    < >  --  97* 102  ?CO2 22  --    < >  --  31 29  ?GLUCOSE 128*  --    < >  --   104* 110*  ?BUN 11  --    < >  --  8 16  ?CREATININE 1.11  --    < >  --  1.48* 1.55*  ?CALCIUM 7.5*  --    < >  --  8.5* 8.2*  ?MG 1.7 1.9  --  1.7  --  1.5*  ?PHOS 3.6 3.7  --   --   --   --   ? < > = values in this interval not displayed.  ? ? ? ?Lipid Panel:  ?   ?Component Value Date/Time  ? CHOL 80 06/07/2021 0451  ? TRIG 248 (H) 06/07/2021 0451  ? HDL <10 (L) 06/07/2021 0451  ? CHOLHDL NOT CALCULATED 06/07/2021 0451  ? VLDL 50 (H) 06/07/2021 0451  ? [place] NOT CALCULATED 06/07/2021 0451  ? ?HgbA1c:  ?Lab Results  ?Component Value Date  ? HGBA1C 5.2 06/07/2021  ? ?Urine Drug Screen:  ?   ?Component Value Date/Time  ? LABOPIA POSITIVE (A)  06/01/2021 1212  ? COCAINSCRNUR NONE DETECTED 06/01/2021 1212  ? LABBENZ NONE DETECTED 06/01/2021 1212  ? AMPHETMU NONE DETECTED 06/01/2021 1212  ? THCU NONE DETECTED 06/01/2021 1212  ? LABBARB NONE DETECTED 06/01/2021 1212  ?  ?Alcohol Level No results found for: ETH ? ?IMAGING ? ?CT ANGIO HEAD NECK W WO CM ? ?Result Date: 06/06/2021 ?CLINICAL DATA:  Stroke follow-up EXAM: CT ANGIOGRAPHY HEAD AND NECK TECHNIQUE: Multidetector CT imaging of the head and neck was performed using the standard protocol during bolus administration of intravenous contrast. Multiplanar CT image reconstructions and MIPs were obtained to evaluate the vascular anatomy. Carotid stenosis measurements (when applicable) are obtained utilizing NASCET criteria, using the distal internal carotid diameter as the denominator. RADIATION DOSE REDUCTION: This exam was performed according to the departmental dose-optimization program which includes automated exposure control, adjustment of the mA and/or kV according to patient size and/or use of iterative reconstruction technique. CONTRAST:  4mL OMNIPAQUE IOHEXOL 350 MG/ML SOLN COMPARISON:  Brain MRI from yesterday FINDINGS: CT HEAD FINDINGS Brain: Known acute infarcts are subtle compared to prior brain MRI. No gross progression and no hemorrhage. Vascular: See  below Skull: Negative Sinuses: Negative Orbits: Negative Review of the MIP images confirms the above findings CTA NECK FINDINGS Aortic arch: Atheromatous plaque and elongation. No acute finding or dilatation. Right carotid system: Mixed density plaque at the bifurcation and proximal ICA without stenosis or ulceration. Left carotid system: Atheromatous plaque centered at the bifurcation, mixed density. No stenosis or ulceration. Vertebral arteries: No proximal subclavian stenosis. Motion artifact affects both proximal vertebral arteries with no suspected stenosis and no beading or dissection flap seen. Skeleton: No acute or aggressive finding Other neck: Calcification and scar-like appearance anterior to the distorted trachea at the thoracic inlet, suspect prior tracheostomy. Upper chest: Extensive ground-glass and consolidative opacity in the upper lungs with small fissural pleural fluid on the left. Review of the MIP images confirms the above findings CTA HEAD FINDINGS Anterior circulation: No significant stenosis, proximal occlusion, aneurysm, or vascular malformation. Mild atheromatous calcification of the carotid siphon Posterior circulation: The vertebral and basilar arteries are smoothly contoured and diffusely patent. No branch occlusion, beading, or flow limiting stenosis. Mild distal PCA atheromatous irregularity is suspected. Venous sinuses: Diffusely patent Anatomic variants: Aplastic left A1 segment Review of the MIP images confirms the above findings IMPRESSION: No emergent arterial finding. There is atherosclerosis without major vessel flow limiting stenosis or detected embolic source. Acute infarcts are underestimated relative to prior brain MRI. No hemorrhage or evidence of progression. Electronically Signed   By: Jorje Guild M.D.   On: 06/06/2021 10:15  ? ?MR BRAIN WO CONTRAST ? ?Result Date: 06/18/2021 ?CLINICAL DATA:  Neuro deficit, acute, stroke suspected. Generalized seizure. EXAM: MRI HEAD  WITHOUT CONTRAST TECHNIQUE: Multiplanar, multiecho pulse sequences of the brain and surrounding structures were obtained without intravenous contrast. COMPARISON:  None. FINDINGS: Brain: Diffusion imaging shows a total of 5 or 6 1 cm or smaller infarctions affecting the cortex in the right frontal, parietal and occipital lobes. Similar but less numerous infarctions are noted in the right parietal region. The most likely explanation is that of watershed infarctions. Micro embolic infarctions are possible. There is an old small cortical infarction at the right frontoparietal vertex which does not show acute restricted diffusion. No large vessel territory infarction. The brainstem and cerebellum are normal. No evidence of pre-existing small-vessel disease. No hemorrhage, hydrocephalus or extra-axial collection. Vascular: Major vessels at the base of the brain show flow.  Skull and upper cervical spine: Negative Sinuses/Orbits: Clear/normal Other: None IMPRESSION: Several acute subcentimeter cortical infarctions affecting both hemispheres as outlined above, most consistent with watershed infarctions. Micro embolic infarctions are possible but seemingly less likely. No large confluent infarction, swelling or hemorrhage. 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 Electronically Signed   By: Nelson Chimes M.D.   On: 06/04/2021 13:06  ? ?DG CHEST PORT 1 VIEW ? ?Result Date: 06/06/2021 ?CLINICAL DATA:  Shortness of breath and severe sepsis EXAM: PORTABLE CHEST 1 VIEW COMPARISON:  06/30/2021 FINDINGS: Endotracheal tube with tip at the clavicular heads. The enteric tube tip and side port at the level of a gastric pull-through. Right PICC with tip at the upper cavoatrial junction. Dense bilateral airspace disease. Evidence of lateral pleural fluid at the left base. Cardiomegaly and vascular pedicle widening. IMPRESSION: 1. Unchanged bilateral pneumonia. 2. New enteric tube with tip and side port at the  gastric pull-through. Electronically Signed   By: Jorje Guild M.D.   On: 06/06/2021 06:05  ? ?EEG adult ? ?Result Date: 06/06/2021 ?Lora Havens, MD     06/06/2021  8:41 AM Patient Name: Colin Hamilton MR

## 2021-06-08 DIAGNOSIS — Z515 Encounter for palliative care: Secondary | ICD-10-CM | POA: Diagnosis not present

## 2021-06-08 DIAGNOSIS — G40901 Epilepsy, unspecified, not intractable, with status epilepticus: Secondary | ICD-10-CM

## 2021-06-08 DIAGNOSIS — R6520 Severe sepsis without septic shock: Secondary | ICD-10-CM | POA: Diagnosis not present

## 2021-06-08 DIAGNOSIS — A419 Sepsis, unspecified organism: Secondary | ICD-10-CM | POA: Diagnosis not present

## 2021-06-08 DIAGNOSIS — G9341 Metabolic encephalopathy: Secondary | ICD-10-CM | POA: Diagnosis not present

## 2021-06-08 DIAGNOSIS — Z7189 Other specified counseling: Secondary | ICD-10-CM

## 2021-06-08 DIAGNOSIS — J189 Pneumonia, unspecified organism: Secondary | ICD-10-CM | POA: Diagnosis not present

## 2021-06-08 LAB — CBC
HCT: 27.8 % — ABNORMAL LOW (ref 39.0–52.0)
Hemoglobin: 8.8 g/dL — ABNORMAL LOW (ref 13.0–17.0)
MCH: 28.5 pg (ref 26.0–34.0)
MCHC: 31.7 g/dL (ref 30.0–36.0)
MCV: 90 fL (ref 80.0–100.0)
Platelets: 198 10*3/uL (ref 150–400)
RBC: 3.09 MIL/uL — ABNORMAL LOW (ref 4.22–5.81)
RDW: 18.5 % — ABNORMAL HIGH (ref 11.5–15.5)
WBC: 21.2 10*3/uL — ABNORMAL HIGH (ref 4.0–10.5)
nRBC: 0.2 % (ref 0.0–0.2)

## 2021-06-08 LAB — GLUCOSE, CAPILLARY
Glucose-Capillary: 119 mg/dL — ABNORMAL HIGH (ref 70–99)
Glucose-Capillary: 166 mg/dL — ABNORMAL HIGH (ref 70–99)
Glucose-Capillary: 136 mg/dL — ABNORMAL HIGH (ref 70–99)
Glucose-Capillary: 124 mg/dL — ABNORMAL HIGH (ref 70–99)
Glucose-Capillary: 153 mg/dL — ABNORMAL HIGH (ref 70–99)
Glucose-Capillary: 151 mg/dL — ABNORMAL HIGH (ref 70–99)

## 2021-06-08 LAB — BASIC METABOLIC PANEL
Anion gap: 7 (ref 5–15)
BUN: 20 mg/dL (ref 6–20)
CO2: 28 mmol/L (ref 22–32)
Calcium: 8.3 mg/dL — ABNORMAL LOW (ref 8.9–10.3)
Chloride: 104 mmol/L (ref 98–111)
Creatinine, Ser: 1.14 mg/dL (ref 0.61–1.24)
GFR, Estimated: >60 mL/min (ref >60)
Glucose, Bld: 136 mg/dL — ABNORMAL HIGH (ref 70–99)
Potassium: 4.1 mmol/L (ref 3.5–5.1)
Sodium: 139 mmol/L (ref 135–145)

## 2021-06-08 LAB — TRIGLYCERIDES: Triglycerides: 769 mg/dL — ABNORMAL HIGH (ref <150)

## 2021-06-08 LAB — MAGNESIUM: Magnesium: 2.4 mg/dL (ref 1.7–2.4)

## 2021-06-08 LAB — PHOSPHORUS: Phosphorus: 3.5 mg/dL (ref 2.5–4.6)

## 2021-06-08 MED ORDER — DEXMEDETOMIDINE HCL IN NACL 400 MCG/100ML IV SOLN
0.0000 ug/kg/h | INTRAVENOUS | Status: DC
  Administered 2021-06-08 (×2): 1.2 ug/kg/h via INTRAVENOUS
  Administered 2021-06-08: 0.4 ug/kg/h via INTRAVENOUS
  Administered 2021-06-09 – 2021-06-10 (×7): 1.2 ug/kg/h via INTRAVENOUS
  Filled 2021-06-08 (×11): qty 100

## 2021-06-08 MED ORDER — BUDESONIDE 0.25 MG/2ML IN SUSP
0.2500 mg | Freq: Two times a day (BID) | RESPIRATORY_TRACT | Status: DC
  Administered 2021-06-08 – 2021-06-13 (×11): 0.25 mg via RESPIRATORY_TRACT
  Filled 2021-06-08 (×10): qty 2

## 2021-06-08 MED ORDER — ARFORMOTEROL TARTRATE 15 MCG/2ML IN NEBU
15.0000 ug | INHALATION_SOLUTION | Freq: Two times a day (BID) | RESPIRATORY_TRACT | Status: DC
  Administered 2021-06-08 – 2021-06-13 (×10): 15 ug via RESPIRATORY_TRACT
  Filled 2021-06-08 (×11): qty 2

## 2021-06-08 NOTE — Consult Note (Signed)
? ?                                                                                ?Consultation Note ?Date: 06/08/2021  ? ?Patient Name: Colin Hamilton  ?DOB: 03-19-1960  MRN: [id number]  Age / Sex: 61 y.o., male  ?PCP: Pcp, No ?Referring Physician: Garner Nash, DO ? ?Reason for Consultation: GOC ? ?HPI/Patient Profile: 61 y.o. male  with past medical history of multiple gastric surgeries, esophageal perforation, hiatal hernia, sepsis r/t C Diff, Barrett's esophagus, one kidney,  admitted on 05/12/2021 with shortness of breath, weakness. He had just had a colonoscopy. He was admitted and treatment started for sepsis related to pneumonia (likely aspiration). His condition has worsened requiring eventual intubation on 4/2. On 4/5 he started having seizures. EEG indicates severe diffuse encephalopathy of nonspecific etiology. Brain MRI showed small watershed infarcts but CTA with no large vessel stenosis or occlusions. His recovery has been complicated by sudden increases in his HR and RR requiring increased sedation. Most recent chest xray on 4/6 unchanged bilateral pneumonia. Palliative medicine consulted for [place].  ? ?Primary Decision Maker ?NEXT OF KIN- children [name] and [place] ? ?Discussion: ?I have reviewed medical records including EPIC notes, labs and imaging, received report from medical team, assessed the patient and then spoke via phone with [name] and [place]  to discuss diagnosis prognosis and GOC.  ? ?I introduced Palliative Medicine as specialized medical care for people living with serious illness. It focuses on providing relief from the symptoms and stress of a serious illness. The goal is to improve quality of life for both the patient and the family. ? ?We discussed a brief life review of the patient. He moved to [facility] in July. He has three children, he is not married.  ? ?As far as functional and nutritional status- prior to this admission he was living independently.   ? ?We discussed  patient's current illness and what it means in the larger context of patient's on-going co-morbidities.  Natural disease trajectory and expectations at EOL were discussed. ? ?We discussed the frailty of patient's condition- and the fact that  difficulty in lifting sedation impedes ability to wean from vent, also makes neurological prognosis difficult.  ? ?Advance directives, concepts specific to code status, artificial feeding and hydration, and rehospitalization were considered and discussed. ?Family notes that in 2019 patient had similar presentation. He was on a ventilator for a month- had a trach placed and was able to recover and be de cannulated.  He does not have written advanced directives- however, they note that he would not want to be permanently prolonged in a vegetative state.  ? ?Discussed with patient/family the importance of continued conversation with family and the medical providers regarding overall plan of care and treatment options, ensuring decisions are within the context of the patient?s values and GOCs.   ? ?Encouraged patient/family to consider DNR status understanding evidenced based poor outcomes in similar hospitalized patients, as the cause of the arrest is likely associated with chronic decline rather than a reversible acute cardio-pulmonary event. They plan to discuss.  ? ?Questions and concerns were addressed. The family was encouraged to call with  questions or concerns.  ? ? ?SUMMARY OF RECOMMENDATIONS ?-Continue full scope full code for now, would be amenable to tracheostomy if patient was showing improvements and not as a means for permanently artificially prolonging life- he has had trach and long term vent in the past and recovered to independent state ?-Patient's son is preparing to travel to [place], should be arriving this week ?-PMT will continue to follow and address [place]    ? ?Code Status/Advance Care Planning: ?Full code ? ? ?Prognosis:   ?Unable to  determine ? ?Discharge Planning: To Be Determined ? ?Primary Diagnoses: ?Present on Admission: ? (Resolved) Acute respiratory failure with hypoxia ([place]) ? Abdominal pain ? Essential hypertension ? (Resolved) Sepsis ([place]) ? Sinus tachycardia ? Asthma in adult, mild intermittent, uncomplicated ? Protein-calorie malnutrition, severe ? History of alcohol abuse ? ? ?Review of Systems ? ?Physical Exam ? ?Vital Signs: BP 100/60   Pulse (!) 125   Temp 98.2 ?F (36.8 ?C) (Oral)   Resp (!) 41   Ht 5\' 4"  (1.626 m)   Wt 61.9 kg   SpO2 94%   BMI 23.42 kg/m?  ?Pain Scale: CPOT ?POSS *See Group Information*: S-Acceptable,Sleep, easy to arouse ?Pain Score: 1  ? ? ?SpO2: SpO2: 94 % ?O2 Device:SpO2: 94 % ?O2 Flow Rate: .O2 Flow Rate (L/min): 60 L/min ? ?IO: Intake/output summary:  ?Intake/Output Summary (Last 24 hours) at 06/08/2021 1134 ?Last data filed at 06/08/2021 1000 ?Gross per 24 hour  ?Intake 2089.93 ml  ?Output 1300 ml  ?Net 789.93 ml  ? ? ?LBM: Last BM Date : 06/07/21 ?Baseline Weight: Weight: 55.3 kg ?Most recent weight: Weight: 61.9 kg     ? ?Time Total: 100 mins ? ? ?Signed by: ?Mariana Kaufman, AGNP-C ?Palliative Medicine ? ?  ?Please contact Palliative Medicine Team phone at [phone] for questions and concerns.  ?For individual provider: See Amion ? ? ? ? ? ? ? ? ? ? ? ? ? ? ?

## 2021-06-08 NOTE — Progress Notes (Signed)
LTM maint complete - no skin breakdown under: Fp1 Fp2 F3 F7  ?[facility] monitored, Event button test confirmed by [facility]. ? ?

## 2021-06-08 NOTE — Progress Notes (Addendum)
STROKE TEAM PROGRESS NOTE  ? ? ?INTERVAL HISTORY ?His RN is at the bedside. He remains comatose with gaze appears midline, and left gaze deviation has resolved.  No blink to threat or tracking noted. . Vimpat and Keppra for seizures. Continuous EEG remains in place and shows diffuse encephalopathy but no seizures.  ?Seizure like episode noted by RN in the afternoon on 4/7, Vimpat increased. Episode included increased BP to systolic [id number] and eye deviation. HR 120s with and without stimulation this afternoon, sedation infusing. Patient does not withdraw to pain or respond to sternal rub on exam.  ?Overnight continuous EEG monitoring shows severe diffuse encephalopathy with nonspecific etiology but no definite epileptiform features ? ? ?OBJECTIVE ?Vitals:  ? 06/08/21 1200 06/08/21 1209 06/08/21 1215 06/08/21 1245  ?BP: 117/72  (!) 143/82 (!) 205/104  ?Pulse: (!) 125  (!) 124 (!) 132  ?Resp: (!) 39  (!) 40 (!) 44  ?Temp:      ?TempSrc:      ?SpO2: 95% 95% 93% 93%  ?Weight:      ?Height:      ? ? ?CBC:  ?Recent Labs  ?Lab 06/01/21 ?2320 06/02/21 ?0309 06/03/21 ?[id number] 06/07/21 ?0451 06/08/21 ?[id number]  ?WBC 21.9* 23.3*   < > 16.9* 21.2*  ?NEUTROABS 19.5* 21.2*  --   --   --   ?HGB 11.7* 13.3   < > 9.0* 8.8*  ?HCT 37.2* 41.2   < > 28.4* 27.8*  ?MCV 90.3 87.3   < > 87.9 90.0  ?PLT PLATELET CLUMPS NOTED ON SMEAR, UNABLE TO ESTIMATE 107*   < > 199 198  ? < > = values in this interval not displayed.  ? ? ? ?Basic Metabolic Panel:  ?Recent Labs  ?Lab 06/04/21 ?1717 06/08/2021 ?0242 06/07/21 ?0451 06/08/21 ?[id number]  ?NA  --    < > 139 139  ?K  --    < > 3.1* 4.1  ?CL  --    < > 102 104  ?CO2  --    < > 29 28  ?GLUCOSE  --    < > 110* 136*  ?BUN  --    < > 16 20  ?CREATININE  --    < > 1.55* 1.14  ?CALCIUM  --    < > 8.2* 8.3*  ?MG 1.9   < > 1.5* 2.4  ?PHOS 3.7  --   --  3.5  ? < > = values in this interval not displayed.  ? ? ? ?Lipid Panel:  ?   ?Component Value Date/Time  ? CHOL 80 06/07/2021 0451  ? TRIG 769 (H) 06/08/2021 0313  ? HDL  <10 (L) 06/07/2021 0451  ? CHOLHDL NOT CALCULATED 06/07/2021 0451  ? VLDL 50 (H) 06/07/2021 0451  ? [place] NOT CALCULATED 06/07/2021 0451  ? ?HgbA1c:  ?Lab Results  ?Component Value Date  ? HGBA1C 5.2 06/07/2021  ? ?Urine Drug Screen:  ?   ?Component Value Date/Time  ? LABOPIA POSITIVE (A) 06/01/2021 1212  ? COCAINSCRNUR NONE DETECTED 06/01/2021 1212  ? LABBENZ NONE DETECTED 06/01/2021 1212  ? AMPHETMU NONE DETECTED 06/01/2021 1212  ? THCU NONE DETECTED 06/01/2021 1212  ? LABBARB NONE DETECTED 06/01/2021 1212  ?  ?Alcohol Level No results found for: ETH ? ?IMAGING ? ?No results found. ? ? ?Echocardiogram  ?06/02/2021 ?IMPRESSIONS  ? 1. Left ventricular ejection fraction, by estimation, is 55 to 60%. The  ?left ventricle has normal function. The left ventricle has no regional  ?wall  motion abnormalities. There is mild left ventricular hypertrophy.  ?Left ventricular diastolic parameters  ?are consistent with Grade I diastolic dysfunction (impaired relaxation).  ? 2. Right ventricular systolic function is normal. The right ventricular  ?size is normal.  ? 3. The mitral valve is normal in structure. Trivial mitral valve  ?regurgitation. No evidence of mitral stenosis.  ? 4. The aortic valve is tricuspid. Aortic valve regurgitation is trivial.  ?No aortic stenosis is present.  ? 5. The inferior vena cava is normal in size with greater than 50%  ?respiratory variability, suggesting right atrial pressure of 3 mmHg.  ? ?ECG - ST rate 124 BPM. (See cardiology reading for complete details) ? ?Rapid EEG  ?06/01/2021  ?ABNORMALITY ?- Continuous slow, generalized ?IMPRESSION: ?This limited cerebral EEG was initially suggestive of moderate to severe diffuse encephalopathy, nonspecific etiology.  Gradually, EEG was suggestive of profound with encephalopathy, nonspecific etiology but likely due to sedation.  No seizures or epileptiform discharges were seen throughout the recording. ? ?Overnight EEG with  Video ?06/06/2021 ?ABNORMALITY ?-Continuous slow, generalized ?IMPRESSION: ?This study is suggestive of severe diffuse encephalopathy, nonspecific etiology. No seizures or epileptiform discharges were seen throughout the recording. ?Multiple events were recorded as described above during which patient was noted to have left gaze deviation without concomitant EEG change. However, focal motor seizure may not be seen on scalp EEG. Therefore, clinical correlation is recommended.  ? ?Adult EEG  ?06/06/2021  ?ABNORMALITY ?-Burst suppression, generalized ?IMPRESSION: ?This study is suggestive of profound diffuse encephalopathy, nonspecific etiology which could be secondary to sedation.  No seizures or epileptiform discharges were seen throughout the recording. ? ? LTM EEG ?06/08/2021 ?ABNORMALITY ?-Continuous slow, generalized ? IMPRESSION: ?This study is suggestive of severe diffuse encephalopathy, nonspecific etiology. No seizures or epileptiform discharges were seen throughout the recording. ? ?PHYSICAL EXAM ?Blood pressure (!) 205/104, pulse (!) 132, temperature 99.2 ?F (37.3 ?C), temperature source Oral, resp. rate (!) 44, height 5\' 4"  (1.626 m), weight 61.9 kg, SpO2 93 %. ?Middle-age male sedated and intubated.  Not in distress. . Afebrile. Head is nontraumatic. Neck is supple without bruit.    Cardiac exam no murmur or gallop. Lungs are clear to auscultation. Distal pulses are well felt.  ? ?Neurological Exam ; sedated intubated.  Eyes are closed.  Does not follow any commands.  Gaze is midline.  No response to sternal rub or noxious stimuli.  Patient does not withdraw to pain in any extremity.  No jerking noted on exam today.  ? ? ?ASSESSMENT/PLAN ?[name] is a 61 y.o. male with history of asthma, htn and ETOH abuse who had a colonoscopy in [place] 05/16/2021. After discharge he developed dyspnea and abdominal pain so he came to a [facility] ED for evaluation. The patient was admitted with pneumonia, sepsis  and respiratory failure. Surgery, Gi and CCM consults were obtained. On 06/20/2021 the pts O2 sats and BP dropped. The pt began seizing and a neurology consult was requested from Dr Leonel Ramsay. An MRI revealed several acute subcentimeter cortical infarctions affecting both hemispheres. The pt did not receive TNK as LKW was unclear. MRI scan of the brain shows small bilateral cortical and a few subcortical infarcts in watershed like distribution.  However CT angiogram of the brain  does not show any large vessel stenosis or occlusion. Continuous EEG shows diffuse encephalopathy, with no seizures or epileptiform discharges noted.  ? 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  Patient is also having focal seizures and likely nonconvulsive status which now appears to have resolved ?Resultant decreased mental status and left hemiplegia and focal seizure and status ?MRI head - Several acute sub-centimeter cortical infarctions affecting both hemispheres as outlined above, most consistent with watershed infarctions. Micro embolic infarctions are possible but seemingly less likely. No large confluent infarction, swelling or hemorrhage. 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 ?CTA H&N - No emergent arterial finding. There is atherosclerosis without major vessel flow limiting stenosis or detected embolic source. Acute infarcts are underestimated relative to prior brain MRI. No hemorrhage or evidence of progression. ?2D Echo - 06/02/21 - EF 55 - 60 % ?EEGs - see above ?[employer] Virus 2 - negative ?LDL -31.1 ?HgbA1c -5.2 ?UDS - positive for opiates (Rx) ?VTE prophylaxis - Lovenox ?Diet  ?Diet Order   ? ?       ?  Diet NPO time specified  Diet effective midnight       ?  ? ?  ?  ? ?  ? ?No antithrombotic prior to admission, now on aspirin 81 ?Patient will be  counseled to be compliant with his antithrombotic medications ?Ongoing aggressive stroke risk factor management ?Therapy recommendations:  pending ?Disposition:  Pending ? ?Hypertension ?Home BP meds: metoprolol ?Current BP meds: none

## 2021-06-08 NOTE — Progress Notes (Signed)
? ?NAME:  Colin Hamilton, MRN:  [id number], DOB:  [birth year], LOS: 8 ?ADMISSION DATE:  05/10/2021, CONSULTATION DATE:  06/07/2021 ?REFERRING MD: Cyndia Skeeters, TRH , CHIEF COMPLAINT:  resp distress    ? ?History of Present Illness:  ? ?Colin Hamilton is a 28M with barrett's esophagus, esophagectomy with gastric pull through, atrial fibrillation, HTN, asthma, and alcohol abuse who was admitted for shortness of breath and abdominal pain after colonoscopy. CT chest/abdomen/pelvis -nodular infiltrates especially in left lung concerning for aspiration pneumonia, large hiatal hernia versus gastric polyp, sigmoid diverticulosis, enhancement along right lateral bladder wall. Treated for sepsis and started on bipap. PCCM was consulted on 4/3 due to worsening respiratory distress and intubation requiring pressors. He started to have seizures on 4/5 and was transferred to [facility] for cEEG.  ? ? ?Pertinent  Medical History  ?Barrett's esophagus ?Esophagectomy with gastric pull-through ?Solitary right kidney , CKD stage III yea ?Alcohol use ?Afib ?Asthma  ?HTN ? ?Significant Hospital Events: ?Including procedures, antibiotic start and stop dates in addition to other pertinent events   ?3/30 CT chest/abdomen/pelvis -nodular infiltrates especially in left lung, large hiatal hernia versus gastric polyp, sigmoid diverticulosis, enhancement along right lateral bladder wall, started on vanc, flagyl, cefepime ?3/31 Admitted to [place] ?4/1 abx changed to azithro, ceftriaxone, flagyl  ?4/2 ctx changed to cefepime ?4/3 PCCM consult, worsening resp distress> intubated, pressors  ?4/4 precedex added, remains on 50%/ 8 peep ?4/5 seizure; CT head/ ceribell/ MRI ordered  ?  ?3/30 SARS/ flu > neg ?3/31 MRSA PCR > neg ?3/30 UC > < 10k colonies, insignificant growth ?3/30 Bcx2 > ngtd ?3/31 urine strep> neg ?3/31 urine Legionella >  ?4/2 trach asp >candida albicans ?4/7 additional seizures, ? ? ?Interim History / Subjective:  ? ?Remains critically ill intubated on  the life support, remains long-term continuous video EEG. ? ?Objective   ?Blood pressure 97/66, pulse (!) 130, temperature (!) 97.2 ?F (36.2 ?C), temperature source Core, resp. rate (!) 37, height 5\' 4"  (1.626 m), weight 61.9 kg, SpO2 96 %. ?   ?Vent Mode: PRVC ?FiO2 (%):  [50 %-60 %] 60 % ?Set Rate:  [26 bmp] 26 bmp ?Vt Set:  [420 mL] 420 mL ?PEEP:  [5 cmH20] 5 cmH20 ?Plateau Pressure:  [17 cmH20-26 cmH20] 26 cmH20  ? ?Intake/Output Summary (Last 24 hours) at 06/08/2021 0704 ?Last data filed at 06/08/2021 0600 ?Gross per 24 hour  ?Intake 2146.86 ml  ?Output 1550 ml  ?Net 596.86 ml  ? ?Filed Weights  ? 06/04/21 0500 06/06/21 0500 06/07/21 0500  ?Weight: 63.4 kg 60 kg 61.9 kg  ? ? ?Examination: ?General: Critically ill support ?Neuro: Sedated on ventilor, RASS -5, ?HENT: Left scleral implant, left pupil irregular and compared ?Lungs: Bilateral mechanically ventilated breath sounds ?Cardiovascular: Regular rate rhythm, S1-S2 ?Abdomen: Soft, nondistended ?Extremities: Warm with no significant edema ? ? ?Assessment & Plan:  ? ?Acute hypoxic respiratory failure ?Aspiration pneumonia/ pneumonitis  ?Likely underlying COPD versus history of asthma ?Former smoker ?Plan: ?Remains on full vent support. ?Complete 10-day course antibiotics ?Holding on additional diuresis. ?Scheduled nebs, Brovana plus Pulmicort ?Mental status precludes liberation from ventilator ? ?Status epilepticus, frequent seizures despite 2 AEDs ?Acute metabolic encephalopathy ?Watershed infarcts  ?Plan: ?Continue Keppra 1500 twice daily ?Continue Vimpat 50 every 12 hours ?Sedation continue with propofol ?Remains on long-term video EEG ? ?HFpEF  HTN ?TTE 4/2 > EF 55-60%, no wall motion abnormalities, G1DD, normal RV ?BP has been very labile. He has required pressors  ?Plan: ?Come off phenylephrine ?Holding  bp meds  ? ?Abdominal pain ?Large hiatal hernia ?Sigmoid diverticulosis with concern for sigmoid mass vs diverticular stricture ?Also with developing  adhesions to bladder. Colon[facility] with [facility] ?NG tube in place. EGD performed showing previous surgical anastomosis, Gastritis, A few duodenal polyps. ?P;  ?OP follow up  ? ?CKD III ?Solitary right kidney  ?Cr 1.55 down from 1.48.  Was 1.19 2 days ago. Held lasix yesterday given bump in kidney function.  ?P: ?OP follow up  ?Trend BMP ?Avoid nephrotoxic drugs  ? ?Hypokalemia   ?K+ 3.1. Repleted with 9meq K+  ? ?Hypomagnesemia  ?Mag 1.5  ? ?HLD  ?Triglycerides 248, HDL <10, VLDL 50 ?- continue statin ? ?Normocytic anemia ?Hgb stable at 9.0 up from 8.0 yesterday  ?- follow cbc  ?- no acute signs of bleeding or need for transfusion  ? ?Goals of care ?Continue palliative care discussions  ? ? ?Best Practice (right click and "Reselect all SmartList Selections" daily)  ? ?Diet/type: NPO ?DVT prophylaxis: LMWH ?GI prophylaxis: PPI ?Lines: Arterial Line and yes and it is still needed ?Foley:  N/A ?Code Status:  full code ? ?Labs   ?CBC: ?Recent Labs  ?Lab 06/01/21 ?2320 06/02/21 ?0309 06/03/21 ?[id number] 06/04/21 ?[id number] 06/04/2021 ?0401 06/06/21 ?0530 06/07/21 ?0451 06/08/21 ?[id number]  ?WBC 21.9* 23.3*   < > 19.3* 14.7* 10.3 16.9* 21.2*  ?NEUTROABS 19.5* 21.2*  --   --   --   --   --   --   ?HGB 11.7* 13.3   < > 8.9* 8.8* 8.0* 9.0* 8.8*  ?HCT 37.2* 41.2   < > 28.6* 28.2* 25.1* 28.4* 27.8*  ?MCV 90.3 87.3   < > 92.0 91.9 88.1 87.9 90.0  ?PLT PLATELET CLUMPS NOTED ON SMEAR, UNABLE TO ESTIMATE 107*   < > 143* 136* 124* 199 198  ? < > = values in this interval not displayed.  ? ? ?Basic Metabolic Panel: ?Recent Labs  ?Lab 06/03/21 ?1344 06/03/21 ?1701 06/04/21 ?[id number] 06/04/21 ?1717 06/15/2021 ?0242 06/16/2021 ?[id number] 06/06/21 ?0530 06/07/21 ?0451 06/08/21 ?[id number]  ?NA  --   --  138  --  137  --  138 139 139  ?K  --   --  4.5  --  5.6* 4.3 3.7 3.1* 4.1  ?CL  --   --  111  --  107  --  97* 102 104  ?CO2  --   --  22  --  24  --  31 29 28   ?GLUCOSE  --   --  128*  --  129*  --  104* 110* 136*  ?BUN  --   --  11  --  8  --   8 16 20   ?CREATININE  --   --  1.11  --  1.19  --  1.48* 1.55* 1.14  ?CALCIUM  --   --  7.5*  --  7.7*  --  8.5* 8.2* 8.3*  ?MG 2.1 1.8 1.7 1.9  --  1.7  --  1.5* 2.4  ?PHOS 1.9* 3.2 3.6 3.7  --   --   --   --  3.5  ? ?GFR: ?Estimated Creatinine Clearance: 58.4 mL/min (by C-G formula based on SCr of 1.14 mg/dL). ?Recent Labs  ?Lab 06/01/21 ?1012 06/01/21 ?2320 06/02/21 ?0020 06/02/21 ?0309 06/03/21 ?[id number] 06/03/21 ?[id number] 06/04/21 ?[id number] 06/07/2021 ?0401 06/06/21 ?0530 06/07/21 ?0451 06/08/21 ?[id number]  ?PROCALCITON 6.12  --   --  7.11 7.10  --   --   --   --   --   --   ?  WBC  --    < >  --  23.3* 26.3*  --    < > 14.7* 10.3 16.9* 21.2*  ?LATICACIDVEN 3.1*  --  2.5* 2.1* 2.5* 2.2*  --   --   --   --   --   ? < > = values in this interval not displayed.  ? ? ?Liver Function Tests: ?Recent Labs  ?Lab 06/02/21 ?0309 06/03/21 ?[id number]  ?AST 20  --   ?ALT 7  --   ?ALKPHOS 66  --   ?BILITOT 0.6  --   ?PROT 6.2*  --   ?ALBUMIN 2.7* 2.5*  ? ?Recent Labs  ?Lab 06/03/21 ?[id number]  ?LIPASE 19  ? ?Recent Labs  ?Lab 06/02/21 ?0819  ?AMMONIA 16  ? ? ?ABG ?   ?Component Value Date/Time  ? PHART 7.38 06/17/2021 1348  ? PCO2ART 51 (H) 06/15/2021 1348  ? PO2ART 111 (H) 06/14/2021 1348  ? HCO3 30.2 (H) 06/02/2021 1348  ? TCO2 27 05/07/2021 2217  ? ACIDBASEDEF 2.5 (H) 06/08/2021 0933  ? O2SAT 99.6 06/30/2021 1348  ?  ? ?Coagulation Profile: ?Recent Labs  ?Lab 06/03/21 ?[id number] 06/28/2021 ?[id number]  ?INR 1.6* 1.5*  ? ? ?Cardiac Enzymes: ?Recent Labs  ?Lab 06/02/21 ?0309  ?CKTOTAL 78  ? ? ?HbA1C: ?Hgb A1c MFr Bld  ?Date/Time Value Ref Range Status  ?06/07/2021 04:51 AM 5.2 4.8 - 5.6 % Final  ?  Comment:  ?  (NOTE) ?Pre diabetes:          5.7%-6.4% ? ?Diabetes:              >6.4% ? ?Glycemic control for   <7.0% ?adults with diabetes ?  ?06/05/2020 09:47 PM 5.4 4.8 - 5.6 % Final  ?  Comment:  ?  (NOTE) ?Pre diabetes:          5.7%-6.4% ? ?Diabetes:              >6.4% ? ?Glycemic control for   <7.0% ?adults with diabetes ?  ? ? ?CBG: ?Recent Labs  ?Lab  06/07/21 ?0803 06/07/21 ?1105 06/07/21 ?1502 06/07/21 ?2317 06/08/21 ?0332  ?GLUCAP 121* 119* 107* 132* 166*  ? ? ?Past Medical History:  ?He,  has a past medical history of Asthma, Asthma in adult, mild intermittent, unco

## 2021-06-08 NOTE — Procedures (Addendum)
Patient Name: Colin Hamilton  ?MRN: [id number]  ?Epilepsy Attending: Charlsie Quest  ?Referring Physician/Provider: Milon Dikes, MD ?Duration: 06/07/2021 2228 to 06/08/2021 2228 ?  ?Patient history: 61 year old male with new onset seizures in the setting of severe hypotension/blood pressure fluctuation. EEG to evaluate for seizure ?  ?Level of alertness: comatose ?  ?AEDs during EEG study: LEV, LCM ?  ?Technical aspects: This EEG study was done with scalp electrodes positioned according to the 10-20 International system of electrode placement. Electrical activity was acquired at a sampling rate of 500Hz  and reviewed with a high frequency filter of 70Hz  and a low frequency filter of 1Hz . EEG data were recorded continuously and digitally stored.  ?  ?Description: EEG showed continuous generalized low amplitude 3-7hz  theta-delta slowing.  Hyperventilation and photic stimulation were not performed.    ? ?Patient was noted to have head jerking on 06/08/2021 at 1400.  Concomitant EEG before, during and after the event did not show any EEG changes to suggest seizure. ?  ?ABNORMALITY ?-Continuous slow, generalized ?  ?IMPRESSION: ?This study is suggestive of severe diffuse encephalopathy, nonspecific etiology. No seizures or epileptiform discharges were seen throughout the recording. ? ?Patient was noted to have head jerking on 06/08/2021 at 1400 without concomitant EEG change. This was most likely not epileptic. However, focal motor seizure may not be seen on scalp EEG. Therefore, clinical correlation is recommended. ?   ?  ?

## 2021-06-08 NOTE — Progress Notes (Signed)
RT note: RT trouble shot aline. Would not draw back. Attempted to reposition with no success. RT removed aline and held pressure on sight until bleeding stopped and placed a pressure dressing, ?

## 2021-06-08 NOTE — Progress Notes (Signed)
Maint. Complete, no skin breakdown noted. ?

## 2021-06-09 DIAGNOSIS — A419 Sepsis, unspecified organism: Secondary | ICD-10-CM | POA: Diagnosis not present

## 2021-06-09 DIAGNOSIS — G40901 Epilepsy, unspecified, not intractable, with status epilepticus: Secondary | ICD-10-CM | POA: Diagnosis not present

## 2021-06-09 DIAGNOSIS — R6520 Severe sepsis without septic shock: Secondary | ICD-10-CM | POA: Diagnosis not present

## 2021-06-09 DIAGNOSIS — G9341 Metabolic encephalopathy: Secondary | ICD-10-CM | POA: Diagnosis not present

## 2021-06-09 LAB — GLUCOSE, CAPILLARY
Glucose-Capillary: 113 mg/dL — ABNORMAL HIGH (ref 70–99)
Glucose-Capillary: 118 mg/dL — ABNORMAL HIGH (ref 70–99)
Glucose-Capillary: 120 mg/dL — ABNORMAL HIGH (ref 70–99)
Glucose-Capillary: 123 mg/dL — ABNORMAL HIGH (ref 70–99)
Glucose-Capillary: 127 mg/dL — ABNORMAL HIGH (ref 70–99)
Glucose-Capillary: 127 mg/dL — ABNORMAL HIGH (ref 70–99)
Glucose-Capillary: 131 mg/dL — ABNORMAL HIGH (ref 70–99)

## 2021-06-09 LAB — CBC
HCT: 25 % — ABNORMAL LOW (ref 39.0–52.0)
Hemoglobin: 7.7 g/dL — ABNORMAL LOW (ref 13.0–17.0)
MCH: 28 pg (ref 26.0–34.0)
MCHC: 30.8 g/dL (ref 30.0–36.0)
MCV: 90.9 fL (ref 80.0–100.0)
Platelets: 257 10*3/uL (ref 150–400)
RBC: 2.75 MIL/uL — ABNORMAL LOW (ref 4.22–5.81)
RDW: 19 % — ABNORMAL HIGH (ref 11.5–15.5)
WBC: 25.7 10*3/uL — ABNORMAL HIGH (ref 4.0–10.5)
nRBC: 0.2 % (ref 0.0–0.2)

## 2021-06-09 LAB — BASIC METABOLIC PANEL
Anion gap: 5 (ref 5–15)
BUN: 24 mg/dL — ABNORMAL HIGH (ref 6–20)
CO2: 29 mmol/L (ref 22–32)
Calcium: 8.1 mg/dL — ABNORMAL LOW (ref 8.9–10.3)
Chloride: 106 mmol/L (ref 98–111)
Creatinine, Ser: 1.06 mg/dL (ref 0.61–1.24)
GFR, Estimated: >60 mL/min (ref >60)
Glucose, Bld: 131 mg/dL — ABNORMAL HIGH (ref 70–99)
Potassium: 4.6 mmol/L (ref 3.5–5.1)
Sodium: 140 mmol/L (ref 135–145)

## 2021-06-09 NOTE — Progress Notes (Addendum)
STROKE TEAM PROGRESS NOTE  ? ?INTERVAL HISTORY ?His RN is at the bedside.  Overall the patient remains unchanged although he did have spontaneous movements of the upper extremities reported by the nurse and during rounding today.  He remains comatose with gaze appears midline, and left gaze deviation has resolved.  No blink to threat or tracking noted. . Vimpat and Keppra for seizures. Continuous EEG remains in place and shows diffuse encephalopathy but no seizures.  ?Patient does not withdraw to pain or respond to sternal rub on exam. Spontaneous movement noted in bilateral upper extremities, correlating with increased BP. Systolic [id number] and RR [id number].  ?Overnight continuous EEG monitoring shows severe diffuse encephalopathy with nonspecific etiology but no definite epileptiform features ? ? ?OBJECTIVE ?Vitals:  ? 06/09/21 1127 06/09/21 1140 06/09/21 1200 06/09/21 1230  ?BP: (!) 102/54  115/74 (!) 151/84  ?Pulse:   (!) 119 (!) 121  ?Resp:   (!) 37 (!) 41  ?Temp:  98.6 ?F (37 ?C) 98.4 ?F (36.9 ?C)   ?TempSrc:  Esophageal    ?SpO2:   96% 97%  ?Weight:      ?Height:      ? ? ?CBC:  ?Recent Labs  ?Lab 06/08/21 ?0313 06/09/21 ?0414  ?WBC 21.2* 25.7*  ?HGB 8.8* 7.7*  ?HCT 27.8* 25.0*  ?MCV 90.0 90.9  ?PLT 198 257  ? ? ? ?Basic Metabolic Panel:  ?Recent Labs  ?Lab 06/04/21 ?1717 06/23/2021 ?0242 06/07/21 ?0451 06/08/21 ?[id number] 06/09/21 ?[id number]  ?NA  --    < > 139 139 140  ?K  --    < > 3.1* 4.1 4.6  ?CL  --    < > 102 104 106  ?CO2  --    < > 29 28 29   ?GLUCOSE  --    < > 110* 136* 131*  ?BUN  --    < > 16 20 24*  ?CREATININE  --    < > 1.55* 1.14 1.06  ?CALCIUM  --    < > 8.2* 8.3* 8.1*  ?MG 1.9   < > 1.5* 2.4  --   ?PHOS 3.7  --   --  3.5  --   ? < > = values in this interval not displayed.  ? ? ? ?Lipid Panel:  ?   ?Component Value Date/Time  ? CHOL 80 06/07/2021 0451  ? TRIG 769 (H) 06/08/2021 0313  ? HDL <10 (L) 06/07/2021 0451  ? CHOLHDL NOT CALCULATED 06/07/2021 0451  ? VLDL 50 (H) 06/07/2021 0451  ? [place] NOT CALCULATED  06/07/2021 0451  ? ?HgbA1c:  ?Lab Results  ?Component Value Date  ? HGBA1C 5.2 06/07/2021  ? ?Urine Drug Screen:  ?   ?Component Value Date/Time  ? LABOPIA POSITIVE (A) 06/01/2021 1212  ? COCAINSCRNUR NONE DETECTED 06/01/2021 1212  ? LABBENZ NONE DETECTED 06/01/2021 1212  ? AMPHETMU NONE DETECTED 06/01/2021 1212  ? THCU NONE DETECTED 06/01/2021 1212  ? LABBARB NONE DETECTED 06/01/2021 1212  ?  ?Alcohol Level No results found for: ETH ? ?IMAGING ? ?No results found. ? ? ?Echocardiogram  ?06/02/2021 ?IMPRESSIONS  ? 1. Left ventricular ejection fraction, by estimation, is 55 to 60%. The  ?left ventricle has normal function. The left ventricle has no regional  ?wall motion abnormalities. There is mild left ventricular hypertrophy.  ?Left ventricular diastolic parameters  ?are consistent with Grade I diastolic dysfunction (impaired relaxation).  ? 2. Right ventricular systolic function is normal. The right ventricular  ?size is normal.  ?  3. The mitral valve is normal in structure. Trivial mitral valve  ?regurgitation. No evidence of mitral stenosis.  ? 4. The aortic valve is tricuspid. Aortic valve regurgitation is trivial.  ?No aortic stenosis is present.  ? 5. The inferior vena cava is normal in size with greater than 50%  ?respiratory variability, suggesting right atrial pressure of 3 mmHg.  ? ?ECG - ST rate 124 BPM. (See cardiology reading for complete details) ? ?Rapid EEG  ?06/16/2021  ?ABNORMALITY ?- Continuous slow, generalized ?IMPRESSION: ?This limited cerebral EEG was initially suggestive of moderate to severe diffuse encephalopathy, nonspecific etiology.  Gradually, EEG was suggestive of profound with encephalopathy, nonspecific etiology but likely due to sedation.  No seizures or epileptiform discharges were seen throughout the recording. ? ?Overnight EEG with Video ?06/06/2021 ?ABNORMALITY ?-Continuous slow, generalized ?IMPRESSION: ?This study is suggestive of severe diffuse encephalopathy, nonspecific  etiology. No seizures or epileptiform discharges were seen throughout the recording. ?Multiple events were recorded as described above during which patient was noted to have left gaze deviation without concomitant EEG change. However, focal motor seizure may not be seen on scalp EEG. Therefore, clinical correlation is recommended.  ? ?Adult EEG  ?06/06/2021  ?ABNORMALITY ?-Burst suppression, generalized ?IMPRESSION: ?This study is suggestive of profound diffuse encephalopathy, nonspecific etiology which could be secondary to sedation.  No seizures or epileptiform discharges were seen throughout the recording. ? ? LTM EEG ?06/08/2021 ?ABNORMALITY ?-Continuous slow, generalized ? IMPRESSION: ?This study is suggestive of severe diffuse encephalopathy, nonspecific etiology. No seizures or epileptiform discharges were seen throughout the recording. ? ?PHYSICAL EXAM ?Blood pressure (!) 151/84, pulse (!) 121, temperature 98.4 ?F (36.9 ?C), resp. rate (!) 41, height 5\' 4"  (1.626 m), weight 62 kg, SpO2 97 %. ?Middle-age male sedated and intubated.  Not in distress. . Afebrile. Head is nontraumatic. Neck is supple without bruit.     ? ?Neurological Exam ; sedated intubated.  Eyes are closed.  Does not follow any commands.  Gaze is midline.  No response to sternal rub or noxious stimuli.  Patient does not withdraw to pain in any extremity.  No jerking noted on exam today. Spontaneous movement noted in bilateral upper extremities (this appears to be semipurposeful and did not posturing), no WTP in lower extremities. ? ? ?ASSESSMENT/PLAN ?[name] is a 61 y.o. male with history of asthma, htn and ETOH abuse who had a colonoscopy in [place] 05/01/2021. After discharge he developed dyspnea and abdominal pain so he came to a [facility] ED for evaluation. The patient was admitted with pneumonia, sepsis and respiratory failure. Surgery, Gi and CCM consults were obtained. On 06/23/2021 the pts O2 sats and BP dropped. The pt began  seizing and a neurology consult was requested from Dr Leonel Ramsay. An MRI revealed several acute subcentimeter cortical infarctions affecting both hemispheres. The pt did not receive TNK as LKW was unclear. MRI scan of the brain shows small bilateral cortical and a few subcortical infarcts in watershed like distribution.  However CT angiogram of the brain  does not show any large vessel stenosis or occlusion. Continuous EEG shows diffuse encephalopathy, with no seizures or epileptiform discharges noted.  ? 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  Patient is also having focal seizures and likely nonconvulsive status which now appears to have resolved ?Resultant decreased mental status and left hemiplegia and focal seizure and status ?MRI head - Several acute sub-centimeter  cortical infarctions affecting both hemispheres as outlined above, most consistent with watershed infarctions. Micro embolic infarctions are possible but seemingly less likely. No large confluent infarction, swelling or hemorrhage. 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 ?CTA H&N - No emergent arterial finding. There is atherosclerosis without major vessel flow limiting stenosis or detected embolic source. Acute infarcts are underestimated relative to prior brain MRI. No hemorrhage or evidence of progression. ?2D Echo - 06/02/21 - EF 55 - 60 % ?EEGs - see above ?[employer] Virus 2 - negative ?LDL -31.1 ?HgbA1c -5.2 ?UDS - positive for opiates (Rx) ?VTE prophylaxis - Lovenox ?Diet  ?Diet Order   ? ?       ?  Diet NPO time specified  Diet effective midnight       ?  ? ?  ?  ? ?  ? ?No antithrombotic prior to admission, now on aspirin 81 ?Patient will be counseled to be compliant with his antithrombotic medications ?Ongoing aggressive stroke risk factor management ?Therapy  recommendations:  pending ?Disposition:  Pending ? ?Hypertension ?Home BP meds: metoprolol ?Current BP meds: none  ?BP somewhat low ?Permissive hypertension (OK if < 220/120) but gradually normalize in 5-7 days  ?Long-

## 2021-06-09 NOTE — Progress Notes (Signed)
LTM maint complete - no skin breakdown . 

## 2021-06-09 NOTE — Progress Notes (Signed)
? ?NAME:  Colin Hamilton, MRN:  [id number], DOB:  1960-07-05, LOS: 9 ?ADMISSION DATE:  05/22/2021, CONSULTATION DATE:  06/10/2021 ?REFERRING MD: Cyndia Skeeters, TRH , CHIEF COMPLAINT:  resp distress    ? ?History of Present Illness:  ? ?Colin Hamilton is a 89M with barrett's esophagus, esophagectomy with gastric pull through, atrial fibrillation, HTN, asthma, and alcohol abuse who was admitted for shortness of breath and abdominal pain after colonoscopy. CT chest/abdomen/pelvis -nodular infiltrates especially in left lung concerning for aspiration pneumonia, large hiatal hernia versus gastric polyp, sigmoid diverticulosis, enhancement along right lateral bladder wall. Treated for sepsis and started on bipap. PCCM was consulted on 4/3 due to worsening respiratory distress and intubation requiring pressors. He started to have seizures on 4/5 and was transferred to [facility] for cEEG.  ? ? ?Pertinent  Medical History  ?Barrett's esophagus ?Esophagectomy with gastric pull-through ?Solitary right kidney , CKD stage III yea ?Alcohol use ?Afib ?Asthma  ?HTN ? ?Significant Hospital Events: ?Including procedures, antibiotic start and stop dates in addition to other pertinent events   ?3/30 CT chest/abdomen/pelvis -nodular infiltrates especially in left lung, large hiatal hernia versus gastric polyp, sigmoid diverticulosis, enhancement along right lateral bladder wall, started on vanc, flagyl, cefepime ?3/31 Admitted to [place] ?4/1 abx changed to azithro, ceftriaxone, flagyl  ?4/2 ctx changed to cefepime ?4/3 PCCM consult, worsening resp distress> intubated, pressors  ?4/4 precedex added, remains on 50%/ 8 peep ?4/5 seizure; CT head/ ceribell/ MRI ordered  ?  ?3/30 SARS/ flu > neg ?3/31 MRSA PCR > neg ?3/30 UC > < 10k colonies, insignificant growth ?3/30 Bcx2 > ngtd ?3/31 urine strep> neg ?3/31 urine Legionella >  ?4/2 trach asp >candida albicans ?4/7 additional seizures, ? ? ?Interim History / Subjective:  ? ?Patient remains critically ill  intubated on mechanical life support.  Continuous video EEG present. ? ?Objective   ?Blood pressure (!) 234/127, pulse (!) 140, temperature (!) 97.3 ?F (36.3 ?C), resp. rate (!) 50, height 5\' 4"  (1.626 m), weight 62 kg, SpO2 96 %. ?   ?Vent Mode: PRVC ?FiO2 (%):  [50 %-60 %] 60 % ?Set Rate:  [26 bmp-28 bmp] 26 bmp ?Vt Set:  [420 mL] 420 mL ?PEEP:  [5 cmH20-8 cmH20] 8 cmH20 ?Plateau Pressure:  [19 cmH20-30 cmH20] 29 cmH20  ? ?Intake/Output Summary (Last 24 hours) at 06/09/2021 0940 ?Last data filed at 06/09/2021 0800 ?Gross per 24 hour  ?Intake 1869.54 ml  ?Output 1450 ml  ?Net 419.54 ml  ? ?Filed Weights  ? 06/06/21 0500 06/07/21 0500 06/09/21 0500  ?Weight: 60 kg 61.9 kg 62 kg  ? ? ?Examination: ?General: Critically ill on mechanical life support ?Neuro: Sedated on mechanical ventilator, RASS -5 ?HENT: Left scleral implant, left pupil irregular compared to right ?Lungs: Bilateral mechanically ventilated breath sounds ?Cardiovascular: Regular rate rhythm, S1-S2 ?Abdomen: Soft, nontender, nondistended ?Extremities: Warm no significant edema ? ? ?Assessment & Plan:  ? ?Acute hypoxic respiratory failure ?Aspiration pneumonia/ pneumonitis  ?Likely underlying COPD versus history of asthma ?Former smoker ?Plan: ?Patient remains on full mechanical vent support ?Complete 10 days of antibiotics ?Holding additional diuresis at this time ?Continue scheduled nebs, Brovana plus Pulmicort ?Ventilator is unable to be weaned due to mental status ? ?Status epilepticus, frequent seizures despite 2 AEDs ?Acute metabolic encephalopathy ?Watershed infarcts  ?Plan: ?Continue Keppra, Vimpat ?Off propofol at this time ?Remains on long-term video EEG per neurology. ?We appreciate their help. ? ?HFpEF  HTN ?TTE 4/2 > EF 55-60%, no wall motion abnormalities, G1DD, normal RV ?  BP has been very labile. He has required pressors  ?Plan: ?Holding BP meds vasopressors ? ?Abdominal pain ?Large hiatal hernia ?Sigmoid diverticulosis with concern for  sigmoid mass vs diverticular stricture ?Also with developing adhesions to bladder. Colonoscopy with biopsy performed in [facility] ?NG tube in place. EGD performed showing previous surgical anastomosis, Gastritis, A few duodenal polyps. ?P;  ?Outpatient follow-up ? ?CKD III ?Solitary right kidney  ?Cr 1.55 down from 1.48.  Was 1.19 2 days ago. Held lasix yesterday given bump in kidney function.  ?P: ?Follow urine output ?, Avoid nephrotoxic drugs ? ?Hypokalemia   ?K+ 3.1. Repleted with 99meq K+  ? ?Hypomagnesemia  ?Mag 1.5  ? ?HLD  ?Triglycerides 248, HDL <10, VLDL 50 ?Continue statin ? ?Normocytic anemia ?Hgb stable at 9.0 up from 8.0 yesterday  ?- follow cbc  ?- no acute signs of bleeding or need for transfusion  ? ?Goals of care ?Continue palliative care discussions ?Appreciate there help ? ? ?Best Practice (right click and "Reselect all SmartList Selections" daily)  ? ?Diet/type: NPO ?DVT prophylaxis: LMWH ?GI prophylaxis: PPI ?Lines: Arterial Line and yes and it is still needed ?Foley:  N/A ?Code Status:  full code ? ?Labs   ?CBC: ?Recent Labs  ?Lab 06/12/2021 ?0401 06/06/21 ?0530 06/07/21 ?0451 06/08/21 ?[id number] 06/09/21 ?[id number]  ?WBC 14.7* 10.3 16.9* 21.2* 25.7*  ?HGB 8.8* 8.0* 9.0* 8.8* 7.7*  ?HCT 28.2* 25.1* 28.4* 27.8* 25.0*  ?MCV 91.9 88.1 87.9 90.0 90.9  ?PLT 136* 124* 199 198 257  ? ? ?Basic Metabolic Panel: ?Recent Labs  ?Lab 06/03/21 ?1344 06/03/21 ?1701 06/04/21 ?[id number] 06/04/21 ?1717 06/10/2021 ?0242 06/14/2021 ?[id number] 06/06/21 ?0530 06/07/21 ?0451 06/08/21 ?[id number] 06/09/21 ?[id number]  ?NA  --   --  138  --  137  --  138 139 139 140  ?K  --   --  4.5  --  5.6* 4.3 3.7 3.1* 4.1 4.6  ?CL  --   --  111  --  107  --  97* 102 104 106  ?CO2  --   --  22  --  24  --  31 29 28 29   ?GLUCOSE  --   --  128*  --  129*  --  104* 110* 136* 131*  ?BUN  --   --  11  --  8  --  8 16 20  24*  ?CREATININE  --   --  1.11  --  1.19  --  1.48* 1.55* 1.14 1.06  ?CALCIUM  --   --  7.5*  --  7.7*  --  8.5* 8.2* 8.3* 8.1*  ?MG 2.1 1.8 1.7 1.9   --  1.7  --  1.5* 2.4  --   ?PHOS 1.9* 3.2 3.6 3.7  --   --   --   --  3.5  --   ? ?GFR: ?Estimated Creatinine Clearance: 62.8 mL/min (by C-G formula based on SCr of 1.06 mg/dL). ?Recent Labs  ?Lab 06/03/21 ?[id number] 06/03/21 ?[id number] 06/04/21 ?[id number] 06/06/21 ?0530 06/07/21 ?0451 06/08/21 ?[id number] 06/09/21 ?[id number]  ?PROCALCITON 7.10  --   --   --   --   --   --   ?WBC 26.3*  --    < > 10.3 16.9* 21.2* 25.7*  ?LATICACIDVEN 2.5* 2.2*  --   --   --   --   --   ? < > = values in this interval not displayed.  ? ? ?Liver Function Tests: ?Recent Labs  ?Lab 06/03/21 ?[id number]  ?  ALBUMIN 2.5*  ? ?Recent Labs  ?Lab 06/03/21 ?[id number]  ?LIPASE 19  ? ?No results for input(s): AMMONIA in the last 168 hours. ? ? ?ABG ?   ?Component Value Date/Time  ? PHART 7.38 06/12/2021 1348  ? PCO2ART 51 (H) 06/21/2021 1348  ? PO2ART 111 (H) 06/19/2021 1348  ? HCO3 30.2 (H) 06/15/2021 1348  ? TCO2 27 05/13/2021 2217  ? ACIDBASEDEF 2.5 (H) 06/08/2021 0933  ? O2SAT 99.6 06/11/2021 1348  ?  ? ?Coagulation Profile: ?Recent Labs  ?Lab 06/03/21 ?[id number] 06/17/2021 ?[id number]  ?INR 1.6* 1.5*  ? ? ?Cardiac Enzymes: ?No results for input(s): CKTOTAL, CKMB, CKMBINDEX, TROPONINI in the last 168 hours. ? ? ?HbA1C: ?Hgb A1c MFr Bld  ?Date/Time Value Ref Range Status  ?06/07/2021 04:51 AM 5.2 4.8 - 5.6 % Final  ?  Comment:  ?  (NOTE) ?Pre diabetes:          5.7%-6.4% ? ?Diabetes:              >6.4% ? ?Glycemic control for   <7.0% ?adults with diabetes ?  ?06/05/2020 09:47 PM 5.4 4.8 - 5.6 % Final  ?  Comment:  ?  (NOTE) ?Pre diabetes:          5.7%-6.4% ? ?Diabetes:              >6.4% ? ?Glycemic control for   <7.0% ?adults with diabetes ?  ? ? ?CBG: ?Recent Labs  ?Lab 06/08/21 ?1513 06/08/21 ?2012 06/09/21 ?0010 06/09/21 ?0410 06/09/21 ?0710  ?GLUCAP 153* 151* 118* 123* 113*  ? ? ?Past Medical History:  ?He,  has a past medical history of Asthma, Asthma in adult, mild intermittent, uncomplicated (0000000), Essential hypertension (06/05/2020), GERD with esophagitis (06/05/2020), and History  of alcohol abuse (06/05/2020).  ? ?Surgical History:  ? ?Past Surgical History:  ?Procedure Laterality Date  ? ESOPHAGOGASTRODUODENOSCOPY N/A 06/06/2021  ? Procedure: ESOPHAGOGASTRODUODENOSCOPY (EGD);  Surgeon: [employer]

## 2021-06-09 NOTE — Procedures (Addendum)
Patient Name: Colin Hamilton  ?MRN: [id number]  ?Epilepsy Attending: Charlsie Quest  ?Referring Physician/Provider: Milon Dikes, MD ?Duration: 06/08/2021 2228 to 06/09/2021 2228 ?  ?Patient history: 61 year old male with new onset seizures in the setting of severe hypotension/blood pressure fluctuation. EEG to evaluate for seizure ?  ?Level of alertness: comatose ?  ?AEDs during EEG study: LEV, LCM ?  ?Technical aspects: This EEG study was done with scalp electrodes positioned according to the 10-20 International system of electrode placement. Electrical activity was acquired at a sampling rate of 500Hz  and reviewed with a high frequency filter of 70Hz  and a low frequency filter of 1Hz . EEG data were recorded continuously and digitally stored.  ?  ?Description: EEG showed continuous generalized low amplitude 3-7hz  theta-delta slowing. Hyperventilation and photic stimulation were not performed.    ? ?Ventilator artifact was seen during the study. ?  ?ABNORMALITY ?-Continuous slow, generalized ?  ?IMPRESSION: ?This study is suggestive of severe diffuse encephalopathy, nonspecific etiology. No seizures or epileptiform discharges were seen throughout the recording. ?   ?  ?

## 2021-06-10 ENCOUNTER — Inpatient Hospital Stay (HOSPITAL_COMMUNITY): Payer: Medicare (Managed Care)

## 2021-06-10 DIAGNOSIS — Z515 Encounter for palliative care: Secondary | ICD-10-CM

## 2021-06-10 DIAGNOSIS — Z66 Do not resuscitate: Secondary | ICD-10-CM

## 2021-06-10 DIAGNOSIS — Z7189 Other specified counseling: Secondary | ICD-10-CM | POA: Diagnosis not present

## 2021-06-10 DIAGNOSIS — G40901 Epilepsy, unspecified, not intractable, with status epilepticus: Secondary | ICD-10-CM | POA: Diagnosis not present

## 2021-06-10 DIAGNOSIS — J9601 Acute respiratory failure with hypoxia: Secondary | ICD-10-CM | POA: Diagnosis not present

## 2021-06-10 DIAGNOSIS — A419 Sepsis, unspecified organism: Secondary | ICD-10-CM | POA: Diagnosis not present

## 2021-06-10 DIAGNOSIS — L8990 Pressure ulcer of unspecified site, unspecified stage: Secondary | ICD-10-CM | POA: Insufficient documentation

## 2021-06-10 DIAGNOSIS — G9341 Metabolic encephalopathy: Secondary | ICD-10-CM | POA: Diagnosis not present

## 2021-06-10 DIAGNOSIS — R6520 Severe sepsis without septic shock: Secondary | ICD-10-CM | POA: Diagnosis not present

## 2021-06-10 DIAGNOSIS — I6340 Cerebral infarction due to embolism of unspecified cerebral artery: Secondary | ICD-10-CM | POA: Insufficient documentation

## 2021-06-10 LAB — BASIC METABOLIC PANEL
Anion gap: 5 (ref 5–15)
Anion gap: 6 (ref 5–15)
BUN: 26 mg/dL — ABNORMAL HIGH (ref 6–20)
BUN: 27 mg/dL — ABNORMAL HIGH (ref 6–20)
CO2: 30 mmol/L (ref 22–32)
CO2: 31 mmol/L (ref 22–32)
Calcium: 8.4 mg/dL — ABNORMAL LOW (ref 8.9–10.3)
Calcium: 8.3 mg/dL — ABNORMAL LOW (ref 8.9–10.3)
Chloride: 106 mmol/L (ref 98–111)
Chloride: 103 mmol/L (ref 98–111)
Creatinine, Ser: 0.87 mg/dL (ref 0.61–1.24)
Creatinine, Ser: 1.03 mg/dL (ref 0.61–1.24)
GFR, Estimated: >60 mL/min (ref >60)
GFR, Estimated: >60 mL/min (ref >60)
Glucose, Bld: 117 mg/dL — ABNORMAL HIGH (ref 70–99)
Glucose, Bld: 176 mg/dL — ABNORMAL HIGH (ref 70–99)
Potassium: 5.2 mmol/L — ABNORMAL HIGH (ref 3.5–5.1)
Potassium: 4.6 mmol/L (ref 3.5–5.1)
Sodium: 141 mmol/L (ref 135–145)
Sodium: 140 mmol/L (ref 135–145)

## 2021-06-10 LAB — CBC
HCT: 24.8 % — ABNORMAL LOW (ref 39.0–52.0)
Hemoglobin: 7.7 g/dL — ABNORMAL LOW (ref 13.0–17.0)
MCH: 28.3 pg (ref 26.0–34.0)
MCHC: 31 g/dL (ref 30.0–36.0)
MCV: 91.2 fL (ref 80.0–100.0)
Platelets: 346 10*3/uL (ref 150–400)
RBC: 2.72 MIL/uL — ABNORMAL LOW (ref 4.22–5.81)
RDW: 19.3 % — ABNORMAL HIGH (ref 11.5–15.5)
WBC: 27.7 10*3/uL — ABNORMAL HIGH (ref 4.0–10.5)
nRBC: 0.1 % (ref 0.0–0.2)

## 2021-06-10 LAB — POCT I-STAT 7, (LYTES, BLD GAS, ICA,H+H)
Acid-Base Excess: 7 mmol/L — ABNORMAL HIGH (ref 0.0–2.0)
Acid-Base Excess: 6 mmol/L — ABNORMAL HIGH (ref 0.0–2.0)
Acid-Base Excess: 6 mmol/L — ABNORMAL HIGH (ref 0.0–2.0)
Bicarbonate: 32.9 mmol/L — ABNORMAL HIGH (ref 20.0–28.0)
Bicarbonate: 34.5 mmol/L — ABNORMAL HIGH (ref 20.0–28.0)
Bicarbonate: 34.2 mmol/L — ABNORMAL HIGH (ref 20.0–28.0)
Calcium, Ion: 1.22 mmol/L (ref 1.15–1.40)
Calcium, Ion: 1.27 mmol/L (ref 1.15–1.40)
Calcium, Ion: 1.28 mmol/L (ref 1.15–1.40)
HCT: 22 % — ABNORMAL LOW (ref 39.0–52.0)
HCT: 27 % — ABNORMAL LOW (ref 39.0–52.0)
HCT: 27 % — ABNORMAL LOW (ref 39.0–52.0)
Hemoglobin: 7.5 g/dL — ABNORMAL LOW (ref 13.0–17.0)
Hemoglobin: 9.2 g/dL — ABNORMAL LOW (ref 13.0–17.0)
Hemoglobin: 9.2 g/dL — ABNORMAL LOW (ref 13.0–17.0)
O2 Saturation: 94 %
O2 Saturation: 98 %
O2 Saturation: 86 %
Patient temperature: 98.6
Patient temperature: 98.1
Patient temperature: 98.4
Potassium: 4.7 mmol/L (ref 3.5–5.1)
Potassium: 4.8 mmol/L (ref 3.5–5.1)
Potassium: 4.7 mmol/L (ref 3.5–5.1)
Sodium: 137 mmol/L (ref 135–145)
Sodium: 139 mmol/L (ref 135–145)
Sodium: 140 mmol/L (ref 135–145)
TCO2: 35 mmol/L — ABNORMAL HIGH (ref 22–32)
TCO2: 37 mmol/L — ABNORMAL HIGH (ref 22–32)
TCO2: 36 mmol/L — ABNORMAL HIGH (ref 22–32)
pCO2 arterial: 56.7 mmHg — ABNORMAL HIGH (ref 32–48)
pCO2 arterial: 74.1 mmHg (ref 32–48)
pCO2 arterial: 74.8 mmHg (ref 32–48)
pH, Arterial: 7.371 (ref 7.35–7.45)
pH, Arterial: 7.275 — ABNORMAL LOW (ref 7.35–7.45)
pH, Arterial: 7.268 — ABNORMAL LOW (ref 7.35–7.45)
pO2, Arterial: 76 mmHg — ABNORMAL LOW (ref 83–108)
pO2, Arterial: 123 mmHg — ABNORMAL HIGH (ref 83–108)
pO2, Arterial: 60 mmHg — ABNORMAL LOW (ref 83–108)

## 2021-06-10 LAB — GLUCOSE, CAPILLARY
Glucose-Capillary: 125 mg/dL — ABNORMAL HIGH (ref 70–99)
Glucose-Capillary: 121 mg/dL — ABNORMAL HIGH (ref 70–99)
Glucose-Capillary: 130 mg/dL — ABNORMAL HIGH (ref 70–99)
Glucose-Capillary: 112 mg/dL — ABNORMAL HIGH (ref 70–99)
Glucose-Capillary: 125 mg/dL — ABNORMAL HIGH (ref 70–99)
Glucose-Capillary: 151 mg/dL — ABNORMAL HIGH (ref 70–99)

## 2021-06-10 IMAGING — DX DG ABDOMEN 1V
1 series · 1 of 1 positions shown · non-contrast
Comparison: [DATE].

CLINICAL DATA: Hypoxemia.  Sepsis.

EXAM:
ABDOMEN - 1 VIEW

[abdomen]
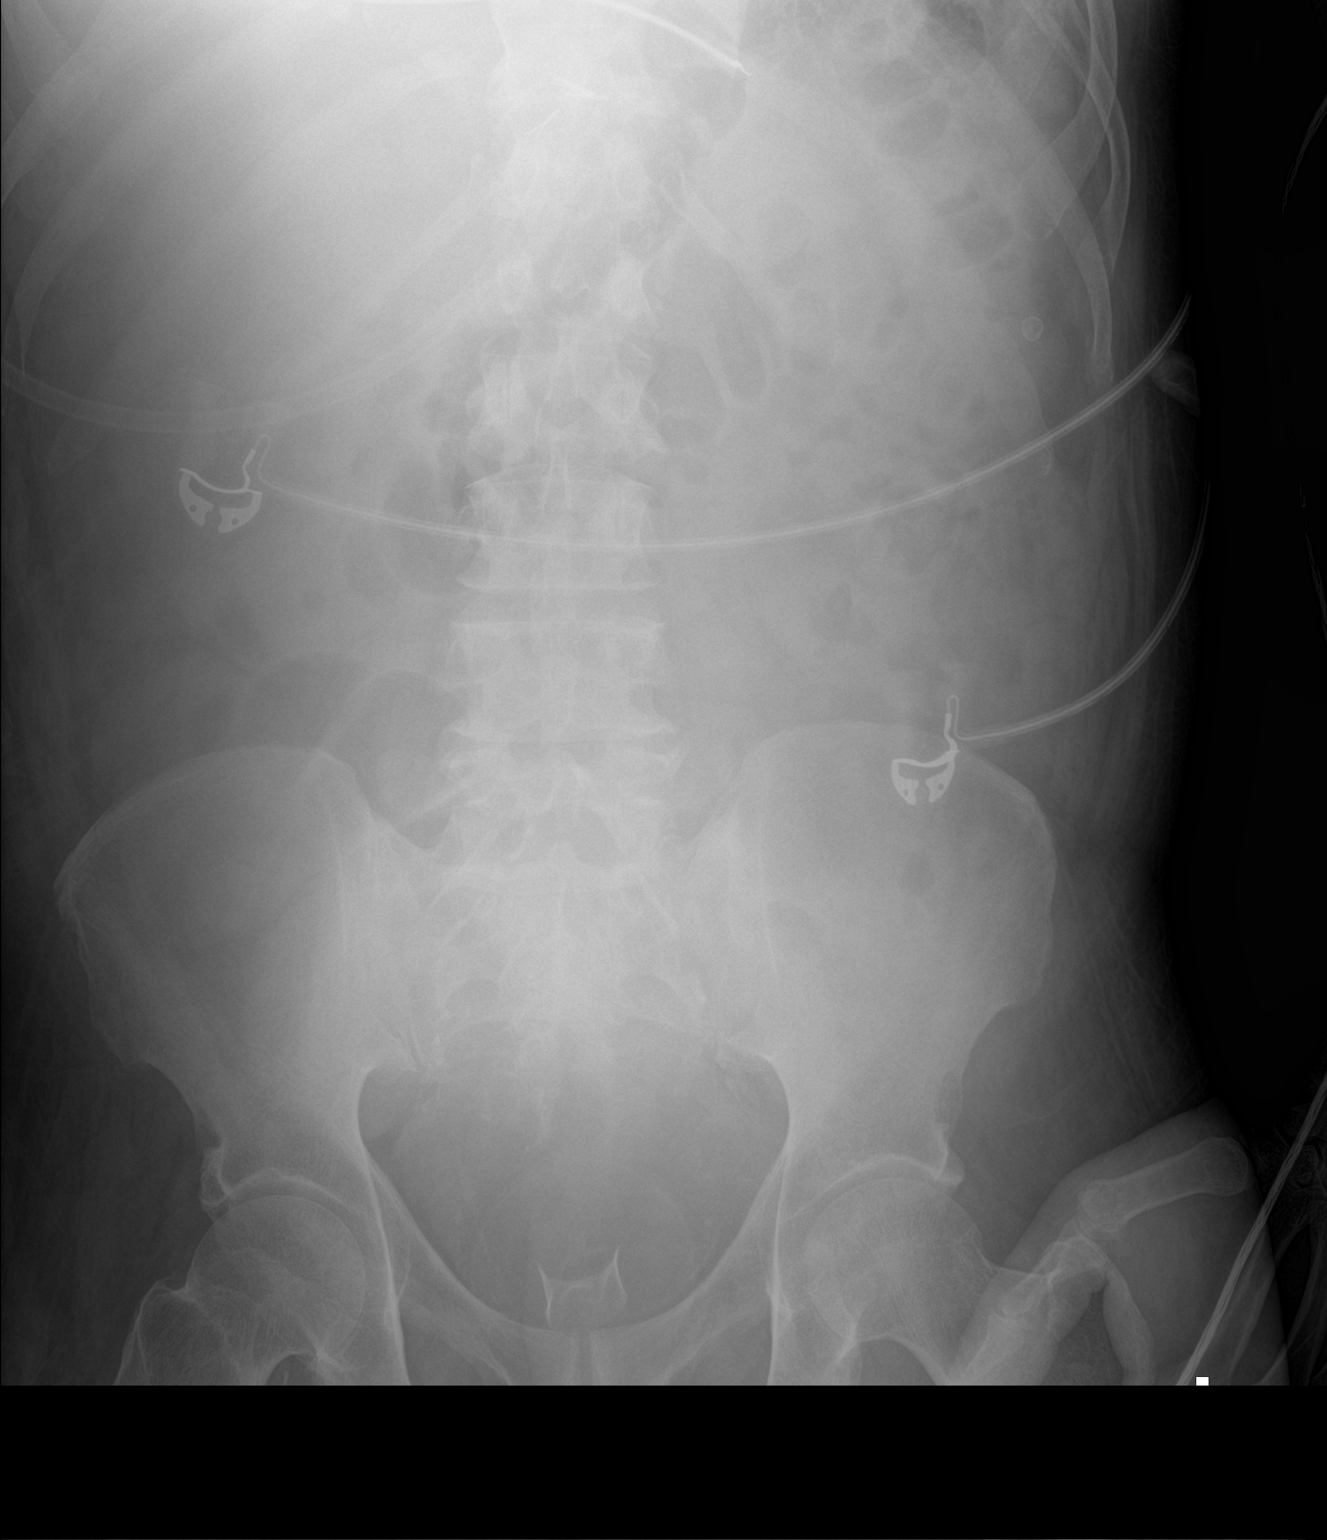

[1 of 1 positions shown; findings below may reference images not displayed]

FINDINGS: The bowel gas pattern is normal. No radio-opaque calculi or other
significant radiographic abnormality are seen.
IMPRESSION: No abnormal bowel dilatation is noted.

## 2021-06-10 IMAGING — DX DG CHEST 1V PORT
1 series · 1 of 1 positions shown · non-contrast
Comparison: Prior chest radiographs [DATE] and earlier. CT
chest/abdomen/pelvis [DATE].

CLINICAL DATA: Provided history: Hypoxemia, sepsis, history of
asthma.

EXAM:
PORTABLE CHEST 1 VIEW

[chest]
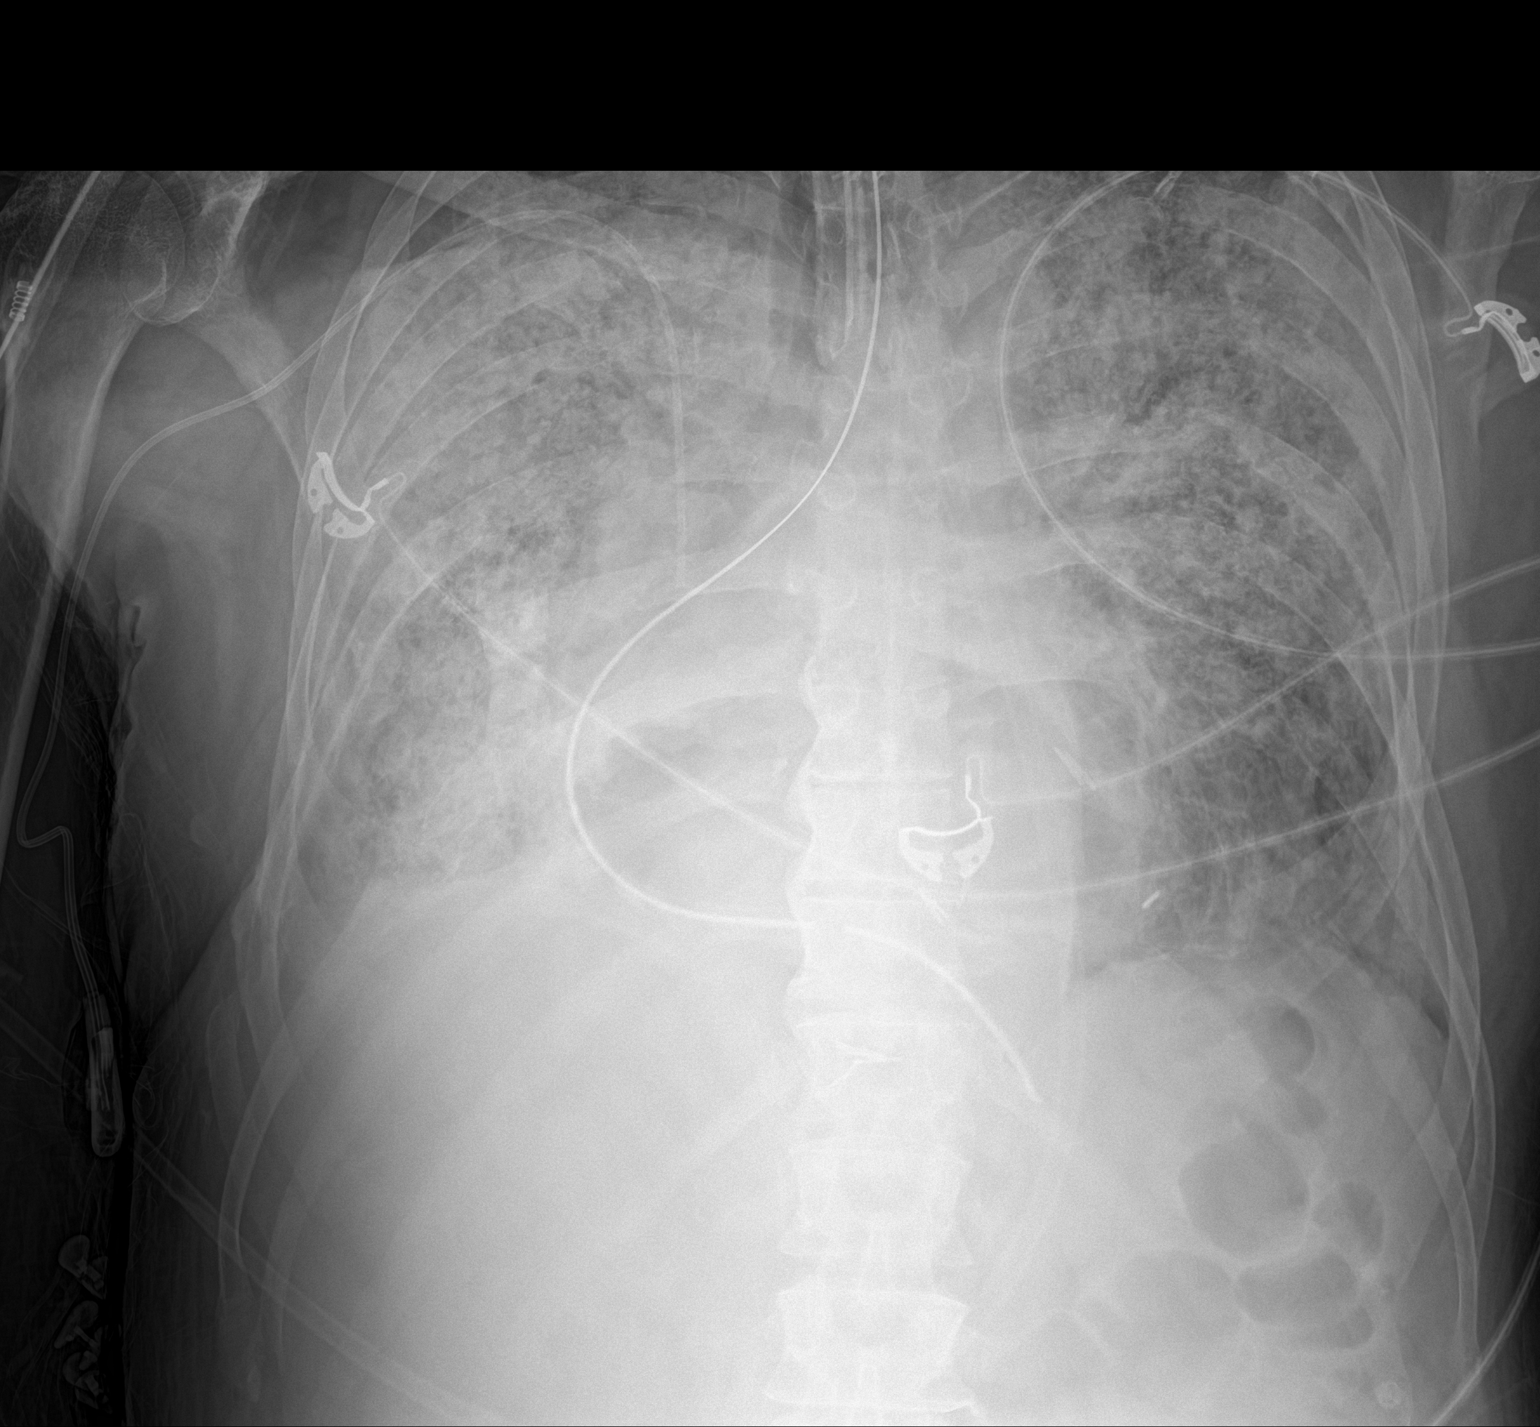

[1 of 1 positions shown; findings below may reference images not displayed]

FINDINGS: Unchanged position of a right-sided PICC with tip projecting at the
level of the superior cavoatrial junction. Slight interval
advancement of the ET tube, now terminating just below the level of
the clavicular heads. As before, the enteric tube is present within
the gastric pull-through, terminating at the level of the left
hemidiaphragm.

The cardiac silhouette is largely obscured, limiting evaluation of
heart size. Persistent extensive airspace disease throughout both
lungs. A small right pleural effusion cannot be excluded. No
evidence of pneumothorax.
IMPRESSION: Positions of lines and tubes, as described.

Persistent extensive airspace disease throughout both lungs.

A small right pleural effusion may be present.

## 2021-06-10 MED ORDER — NOREPINEPHRINE 4 MG/250ML-% IV SOLN
0.0000 ug/min | INTRAVENOUS | Status: DC
  Administered 2021-06-10: 5 ug/min via INTRAVENOUS
  Administered 2021-06-11: 12 ug/min via INTRAVENOUS
  Administered 2021-06-11: 10 ug/min via INTRAVENOUS
  Administered 2021-06-11: 12 ug/min via INTRAVENOUS
  Administered 2021-06-11: 10 ug/min via INTRAVENOUS
  Administered 2021-06-12: 12 ug/min via INTRAVENOUS
  Administered 2021-06-12: 9 ug/min via INTRAVENOUS
  Administered 2021-06-12: 6 ug/min via INTRAVENOUS
  Administered 2021-06-12: 12 ug/min via INTRAVENOUS
  Administered 2021-06-13: 10 ug/min via INTRAVENOUS
  Filled 2021-06-10 (×9): qty 250

## 2021-06-10 MED ORDER — MIDAZOLAM HCL 2 MG/2ML IJ SOLN: 2.0000 mg | INTRAMUSCULAR | Status: DC | PRN

## 2021-06-10 MED ORDER — NOREPINEPHRINE 4 MG/250ML-% IV SOLN
INTRAVENOUS | Status: AC
  Filled 2021-06-10: qty 250

## 2021-06-10 MED ORDER — PROPOFOL 1000 MG/100ML IV EMUL
5.0000 ug/kg/min | INTRAVENOUS | Status: DC
  Administered 2021-06-10: 50 ug/kg/min via INTRAVENOUS
  Administered 2021-06-10: 15 ug/kg/min via INTRAVENOUS
  Administered 2021-06-11 (×3): 45 ug/kg/min via INTRAVENOUS
  Administered 2021-06-11: 50 ug/kg/min via INTRAVENOUS
  Administered 2021-06-12: 45 ug/kg/min via INTRAVENOUS
  Administered 2021-06-12 (×3): 50 ug/kg/min via INTRAVENOUS
  Administered 2021-06-12: 45 ug/kg/min via INTRAVENOUS
  Administered 2021-06-13: 55 ug/kg/min via INTRAVENOUS
  Administered 2021-06-13: 50 ug/kg/min via INTRAVENOUS
  Administered 2021-06-13: 80 ug/kg/min via INTRAVENOUS
  Filled 2021-06-10 (×14): qty 100

## 2021-06-10 MED ORDER — MIDAZOLAM HCL 2 MG/2ML IJ SOLN
2.0000 mg | INTRAMUSCULAR | Status: DC | PRN
  Administered 2021-06-12 – 2021-06-13 (×2): 2 mg via INTRAVENOUS
  Filled 2021-06-10 (×2): qty 2

## 2021-06-10 MED ORDER — SODIUM CHLORIDE 0.9 % IV SOLN: 1.0000 g | Freq: Three times a day (TID) | INTRAVENOUS | Status: DC

## 2021-06-10 MED ORDER — DOCUSATE SODIUM 50 MG/5ML PO LIQD: 100.0000 mg | Freq: Two times a day (BID) | ORAL | Status: DC

## 2021-06-10 MED ORDER — FUROSEMIDE 10 MG/ML IJ SOLN
40.0000 mg | Freq: Once | INTRAMUSCULAR | Status: AC
  Administered 2021-06-10: 40 mg via INTRAVENOUS
  Filled 2021-06-10: qty 4

## 2021-06-10 MED ORDER — SODIUM CHLORIDE 0.9 % IV SOLN
1.0000 g | Freq: Three times a day (TID) | INTRAVENOUS | Status: DC
  Administered 2021-06-10 – 2021-06-13 (×10): 1 g via INTRAVENOUS
  Filled 2021-06-10 (×10): qty 20

## 2021-06-10 MED ORDER — POLYETHYLENE GLYCOL 3350 17 G PO PACK: 17.0000 g | PACK | Freq: Every day | ORAL | Status: DC

## 2021-06-10 MED ORDER — VECURONIUM BROMIDE 10 MG IV SOLR
0.1000 mg/kg | Freq: Once | INTRAVENOUS | Status: AC
  Administered 2021-06-10: 6.2 mg via INTRAVENOUS
  Filled 2021-06-10: qty 10

## 2021-06-10 MED ORDER — PHENYLEPHRINE HCL-NACL 20-0.9 MG/250ML-% IV SOLN
INTRAVENOUS | Status: AC
  Filled 2021-06-10: qty 250

## 2021-06-10 NOTE — Progress Notes (Signed)
STROKE TEAM PROGRESS NOTE  ? ?INTERVAL HISTORY ?His mother is at the bedside.  Overall the patient`s condition has declined with worsening respiratory failure and not developing ARDS requiring 100% oxygen.  He is remains sedated with unchanged neurological exam.  He remains comatose with gaze appears midline,  No blink to threat or tracking noted. . Vimpat and Keppra are continued for seizures. Continuous EEG remains in place and shows diffuse encephalopathy but no seizures.  ?Patient does not withdraw to pain or respond to sternal rub on exam. Spontaneous movement noted in bilateral upper extremities, correlating with increased BP.   ?Overnight continuous EEG monitoring shows severe diffuse encephalopathy with nonspecific etiology but no definite epileptiform features ? ? ?OBJECTIVE ?Vitals:  ? 06/10/21 1100 06/10/21 1107 06/10/21 1130 06/10/21 1200  ?BP: (!) 94/58 (!) 94/58 (!) 81/59 125/66  ?Pulse: (!) 126 (!) 129 (!) 128 (!) 129  ?Resp: (!) 32 (!) 29 (!) 33 (!) 33  ?Temp:    98.4 ?F (36.9 ?C)  ?TempSrc:      ?SpO2: 95% 97% 96% 96%  ?Weight:      ?Height:      ? ? ?CBC:  ?Recent Labs  ?Lab 06/09/21 ?0414 06/10/21 ?0148 06/10/21 ?[id number]  ?WBC 25.7*  --  27.7*  ?HGB 7.7* 7.5* 7.7*  ?HCT 25.0* 22.0* 24.8*  ?MCV 90.9  --  91.2  ?PLT 257  --  346  ? ? ?Basic Metabolic Panel:  ?Recent Labs  ?Lab 06/04/21 ?1717 06/02/2021 ?0242 06/07/21 ?0451 06/08/21 ?[id number] 06/09/21 ?[id number] 06/10/21 ?0148 06/10/21 ?[id number]  ?NA  --    < > 139 139 140 137 141  ?K  --    < > 3.1* 4.1 4.6 4.7 5.2*  ?CL  --    < > 102 104 106  --  106  ?CO2  --    < > 29 28 29   --  30  ?GLUCOSE  --    < > 110* 136* 131*  --  117*  ?BUN  --    < > 16 20 24*  --  26*  ?CREATININE  --    < > 1.55* 1.14 1.06  --  0.87  ?CALCIUM  --    < > 8.2* 8.3* 8.1*  --  8.4*  ?MG 1.9   < > 1.5* 2.4  --   --   --   ?PHOS 3.7  --   --  3.5  --   --   --   ? < > = values in this interval not displayed.  ? ? ?Lipid Panel:  ?   ?Component Value Date/Time  ? CHOL 80 06/07/2021 0451  ?  TRIG 769 (H) 06/08/2021 0313  ? HDL <10 (L) 06/07/2021 0451  ? CHOLHDL NOT CALCULATED 06/07/2021 0451  ? VLDL 50 (H) 06/07/2021 0451  ? [place] NOT CALCULATED 06/07/2021 0451  ? ?HgbA1c:  ?Lab Results  ?Component Value Date  ? HGBA1C 5.2 06/07/2021  ? ?Urine Drug Screen:  ?   ?Component Value Date/Time  ? LABOPIA POSITIVE (A) 06/01/2021 1212  ? COCAINSCRNUR NONE DETECTED 06/01/2021 1212  ? LABBENZ NONE DETECTED 06/01/2021 1212  ? AMPHETMU NONE DETECTED 06/01/2021 1212  ? THCU NONE DETECTED 06/01/2021 1212  ? LABBARB NONE DETECTED 06/01/2021 1212  ?  ?Alcohol Level No results found for: ETH ? ?IMAGING ? ?DG Abd 1 View ? ?Result Date: 06/10/2021 ?CLINICAL DATA:  Hypoxemia.  Sepsis. EXAM: ABDOMEN - 1 VIEW COMPARISON:  June 06, 2020. FINDINGS:  The bowel gas pattern is normal. No radio-opaque calculi or other significant radiographic abnormality are seen. IMPRESSION: No abnormal bowel dilatation is noted. Electronically Signed   By: Marijo Conception M.D.   On: 06/10/2021 10:03  ? ?DG CHEST PORT 1 VIEW ? ?Result Date: 06/10/2021 ?CLINICAL DATA:  Provided history: Hypoxemia, sepsis, history of asthma. EXAM: PORTABLE CHEST 1 VIEW COMPARISON:  Prior chest radiographs 06/06/2021 and earlier. CT chest/abdomen/pelvis 05/04/2021. FINDINGS: Unchanged position of a right-sided PICC with tip projecting at the level of the superior cavoatrial junction. Slight interval advancement of the ET tube, now terminating just below the level of the clavicular heads. As before, the enteric tube is present within the gastric pull-through, terminating at the level of the left hemidiaphragm. The cardiac silhouette is largely obscured, limiting evaluation of heart size. Persistent extensive airspace disease throughout both lungs. A small right pleural effusion cannot be excluded. No evidence of pneumothorax. IMPRESSION: Positions of lines and tubes, as described. Persistent extensive airspace disease throughout both lungs. A small right pleural  effusion may be present. Electronically Signed   By: Kellie Simmering D.O.   On: 06/10/2021 10:06   ? ? ?Echocardiogram  ?06/02/2021 ?IMPRESSIONS  ? 1. Left ventricular ejection fraction, by estimation, is 55 to 60%. The  ?left ventricle has normal function. The left ventricle has no regional  ?wall motion abnormalities. There is mild left ventricular hypertrophy.  ?Left ventricular diastolic parameters  ?are consistent with Grade I diastolic dysfunction (impaired relaxation).  ? 2. Right ventricular systolic function is normal. The right ventricular  ?size is normal.  ? 3. The mitral valve is normal in structure. Trivial mitral valve  ?regurgitation. No evidence of mitral stenosis.  ? 4. The aortic valve is tricuspid. Aortic valve regurgitation is trivial.  ?No aortic stenosis is present.  ? 5. The inferior vena cava is normal in size with greater than 50%  ?respiratory variability, suggesting right atrial pressure of 3 mmHg.  ? ?ECG - ST rate 124 BPM. (See cardiology reading for complete details) ? ?Rapid EEG  ?06/20/2021  ?ABNORMALITY ?- Continuous slow, generalized ?IMPRESSION: ?This limited cerebral EEG was initially suggestive of moderate to severe diffuse encephalopathy, nonspecific etiology.  Gradually, EEG was suggestive of profound with encephalopathy, nonspecific etiology but likely due to sedation.  No seizures or epileptiform discharges were seen throughout the recording. ? ?Overnight EEG with Video ?06/06/2021 ?ABNORMALITY ?-Continuous slow, generalized ?IMPRESSION: ?This study is suggestive of severe diffuse encephalopathy, nonspecific etiology. No seizures or epileptiform discharges were seen throughout the recording. ?Multiple events were recorded as described above during which patient was noted to have left gaze deviation without concomitant EEG change. However, focal motor seizure may not be seen on scalp EEG. Therefore, clinical correlation is recommended.  ? ?Adult EEG  ?06/06/2021  ?ABNORMALITY ?-Burst  suppression, generalized ?IMPRESSION: ?This study is suggestive of profound diffuse encephalopathy, nonspecific etiology which could be secondary to sedation.  No seizures or epileptiform discharges were seen throughout the recording. ? ? LTM EEG ?06/08/2021 ?ABNORMALITY ?-Continuous slow, generalized ? IMPRESSION: ?This study is suggestive of severe diffuse encephalopathy, nonspecific etiology. No seizures or epileptiform discharges were seen throughout the recording. ? ?PHYSICAL EXAM ?Blood pressure 125/66, pulse (!) 129, temperature 98.4 ?F (36.9 ?C), resp. rate (!) 33, height 5\' 4"  (1.626 m), weight 62 kg, SpO2 96 %. ?Middle-age male sedated and intubated.  Not in distress. . Afebrile. Head is nontraumatic. Neck is supple without bruit.     ? ?Neurological Exam ; sedated  intubated.  Eyes are closed.  Does not follow any commands.  Gaze is midline.  Corneal reflexes brisk bilaterally.  Weak cough and gag.  No response to sternal rub or noxious stimuli.  Patient does not withdraw to pain in any extremity.  No jerking noted on exam today. Spontaneous movement noted in bilateral upper extremities (this appears to be semipurposeful and did not posturing), no WTP in lower extremities. ? ? ?ASSESSMENT/PLAN ?Colin Hamilton is a 61 y.o. male with history of asthma, htn and ETOH abuse who had a colonoscopy in [place] 05/12/2021. After discharge he developed dyspnea and abdominal pain so he came to a [facility] ED for evaluation. The patient was admitted with pneumonia, sepsis and respiratory failure. Surgery, Gi and CCM consults were obtained. On 06/03/2021 the pts O2 sats and BP dropped. The pt began seizing and a neurology consult was requested from Dr Leonel Ramsay. An MRI revealed several acute subcentimeter cortical infarctions affecting both hemispheres. The pt did not receive TNK as LKW was unclear. MRI scan of the brain shows small bilateral cortical and a few subcortical infarcts in watershed like distribution.   However CT angiogram of the brain  does not show any large vessel stenosis or occlusion. Continuous EEG shows diffuse encephalopathy, with no seizures or epileptiform discharges noted.  ? ?Stroke: Several acute

## 2021-06-10 NOTE — Progress Notes (Signed)
eLink Physician-Brief Progress Note ?Patient Name: Colin Hamilton ?DOB: 10-Nov-1960 ?MRN: [id number] ? ? ?Date of Service ? 06/10/2021  ?HPI/Events of Note ? ABG on 90%/PRVC 30/TV 420/P 8 = 7.26/75.1/61/34.2. Patient is well sedated with Fentanyl and Propofol IV infusions at maximal doses.  ?eICU Interventions ? Plan: ?Trial of NMB with Vercuronium 6.2 mg IV X 1. Observe effect on O2 saturation and airway pressures.   ? ? ? ?Intervention Category ?Major Interventions: Respiratory failure - evaluation and management;Acid-Base disturbance - evaluation and management ? ?Oneita Allmon Dennard Nip ?06/10/2021, 9:06 PM ?

## 2021-06-10 NOTE — Progress Notes (Signed)
ABG results 7.24/41/123 reported to Dr Everardo All as well as Patient continues to have labored respirations with rate of 50 at times  ?Propofol infusion and levophed were started per orders and phenylephrine infusion was discontinued. ?

## 2021-06-10 NOTE — Progress Notes (Signed)
Discontinued cEEG study.  Notified [facility] monitoring.  Skin breakdown observed at electrode sites F8, Fp1, Fp2.  Notified nursing.   ?

## 2021-06-10 NOTE — Procedures (Addendum)
Patient Name: Colin Hamilton  ?MRN: [id number]  ?Epilepsy Attending: Lora Havens  ?Referring Physician/Provider: Amie Portland, MD ?Duration: 06/09/2021 2228 to 06/10/2021 1015 ?  ?Patient history: 61 year old male with new onset seizures in the setting of severe hypotension/blood pressure fluctuation. EEG to evaluate for seizure ?  ?Level of alertness: comatose ?  ?AEDs during EEG study: LEV, LCM ?  ?Technical aspects: This EEG study was done with scalp electrodes positioned according to the 10-20 International system of electrode placement. Electrical activity was acquired at a sampling rate of 500Hz  and reviewed with a high frequency filter of 70Hz  and a low frequency filter of 1Hz . EEG data were recorded continuously and digitally stored.  ?  ?Description: EEG showed continuous generalized low amplitude 3-7hz  theta-delta slowing. Hyperventilation and photic stimulation were not performed.    ?  ?Ventilator artifact was seen during the study. ?  ?ABNORMALITY ?-Continuous slow, generalized ?  ?IMPRESSION: ?This study is suggestive of severe diffuse encephalopathy, nonspecific etiology. No seizures or epileptiform discharges were seen throughout the recording. ?   ?Lora Havens  ?

## 2021-06-10 NOTE — Progress Notes (Signed)
? ?NAME:  Colin Hamilton, MRN:  [id number], DOB:  Dec 03, 1960, LOS: 10 ?ADMISSION DATE:  05/23/2021, CONSULTATION DATE:  06/27/2021 ?REFERRING MD:  Cyndia Skeeters, TRH, CHIEF COMPLAINT:  resp distress     ? ?History of Present Illness:  ? ?Colin Hamilton is a 74M with barrett's esophagus, esophagectomy with gastric pull through, atrial fibrillation, HTN, asthma, and alcohol abuse who was admitted for shortness of breath and abdominal pain after colonoscopy. CT chest/abdomen/pelvis -nodular infiltrates especially in left lung concerning for aspiration pneumonia, large hiatal hernia versus gastric polyp, sigmoid diverticulosis, enhancement along right lateral bladder wall. Treated for sepsis and started on bipap. PCCM was consulted on 4/3 due to worsening respiratory distress and intubation requiring pressors. He started to have seizures on 4/5 and was transferred to [facility] for cEEG.  ? ?Pertinent  Medical History  ? ?Barrett's esophagus ?Esophagectomy with gastric pull-through ?Solitary right kidney , CKD stage III yea ?Alcohol use ?Afib ?Asthma  ?HTN ? ?Significant Hospital Events: ?Including procedures, antibiotic start and stop dates in addition to other pertinent events   ?3/30 CT chest/abdomen/pelvis -nodular infiltrates especially in left lung, large hiatal hernia versus gastric polyp, sigmoid diverticulosis, enhancement along right lateral bladder wall, started on vanc, flagyl, cefepime ?3/31 Admitted to [facility] ?4/1 abx changed to azithro, ceftriaxone, flagyl  ?4/2 ctx changed to cefepime ?4/3 PCCM consult, worsening resp distress> intubated, pressors  ?4/4 precedex added, remains on 50%/ 8 peep ?4/5 seizure; CT head/ ceribell/ MRI ordered  ?  ?3/30 SARS/ flu > neg ?3/31 MRSA PCR > neg ?3/30 UC > < 10k colonies, insignificant growth ?3/30 Bcx2 > ngtd ?3/31 urine strep> neg ?3/31 urine Legionella >  ?4/2 trach asp >candida albicans  ? ?Interim History / Subjective:  ? ?Mental status prevents [place] from weaning from vent ?Still heavily  sedated  ?No longer on pressors  ? ?Objective   ?Blood pressure (!) 154/79, pulse (!) 132, temperature 99 ?F (37.2 ?C), temperature source Esophageal, resp. rate (!) 32, height 5\' 4"  (1.626 m), weight 62 kg, SpO2 92 %. ?   ?Vent Mode: PRVC ?FiO2 (%):  [60 %-100 %] 100 % ?Set Rate:  [26 bmp] 26 bmp ?Vt Set:  [420 mL] 420 mL ?PEEP:  [8 cmH20] 8 cmH20 ?Plateau Pressure:  [16 cmH20-31 cmH20] 27 cmH20  ? ?Intake/Output Summary (Last 24 hours) at 06/10/2021 0707 ?Last data filed at 06/10/2021 0600 ?Gross per 24 hour  ?Intake 2523.06 ml  ?Output 2150 ml  ?Net 373.06 ml  ? ?Filed Weights  ? 06/07/21 0500 06/09/21 0500 06/10/21 0500  ?Weight: 61.9 kg 62 kg 62 kg  ? ? ?Examination: ?General: critically ill appearing, on ventilation ?Neuro: Sedated, non responsive  ?HENT: L scleral implant with L pupil > R. Pinpoint R pupil ?Lungs: Mechanically vented lung sounds  ?Cardiovascular: Tachycardic, no murmurs  ?Abdomen: soft, non distended  ?Extremities: warm, no edema  ? ? ?Assessment & Plan:  ? ?Acute hypoxic respiratory failure ?Aspiration pneumonia/ pneumonitis  ?Likely underlying COPD versus history of asthma ?Former smoker ?Remains on full vent support, unable to be weaned due to mental status   ?Goal O2 sat of 88-95%  ?Will repeat CXR to assess for worsening pneumonia. Will also check abdomen with KUB ?- Lasix 40mg  IV once  ?- Completed 10 day course of abx yesterday 4/9. Starting Meropenum  ?- Continue scheduled nebs, Brovana, Pulmicort  ?- f/u CXR and KUB  ? ?Status epilepticus, frequent seizures despite 2 AEDs ?Acute metabolic encephalopathy ?Watershed infarcts  ?- Neurology consulted, appreciate  recommendations  ?- Continue Keppra and Vimpat  ?- Remains on long-term video EEG per neurology. ? ?Abdominal pain ?Large hiatal hernia ?Sigmoid diverticulosis with concern for sigmoid mass vs diverticular stricture ?Also with developing adhesions to bladder. Colonoscopy with biopsy performed in [facility] ?NG tube in place. EGD  performed showing previous surgical anastomosis, Gastritis, A few duodenal polyps. ?- Surgery and GI signed off, will need outpatient follow-up ? ?CKD III ?Solitary right kidney  ?Cr improved to 0.87 which is better than his baseline ?- strict Is and Os ?- avoid nephrotoxic drugs ? ?Hyperkalemia ?K+ 5.2. Will give lasix 40mg  IV once  ?- recheck at noon  ? ?HLD  ?Triglycerides 248, HDL <10, VLDL 50 ?- Continue statin ? ?Normocytic anemia ?Hgb stable at 7.7.  No acute signs of bleeding or need for transfusion  ?- continue to monitor  ? ?Goals of care  ?Palliative following and is having ongoing discussions with family. Patient remains full scope full code. Per note would be amenable to tracheostomy if showing improvement but not to permanently artificially prolong life   ?- chaplain consulted to provide additional support for family  ? ?Best Practice (right click and "Reselect all SmartList Selections" daily)  ? ?Diet/type: NPO ?DVT prophylaxis: LMWH ?GI prophylaxis: PPI ?Lines: Arterial Line and yes and it is still needed ?Foley:  N/A ?Code Status:  full code ? ?Labs   ?CBC: ?Recent Labs  ?Lab 06/06/21 ?0530 06/07/21 ?0451 06/08/21 ?[id number] 06/09/21 ?[id number] 06/10/21 ?0148 06/10/21 ?[id number]  ?WBC 10.3 16.9* 21.2* 25.7*  --  27.7*  ?HGB 8.0* 9.0* 8.8* 7.7* 7.5* 7.7*  ?HCT 25.1* 28.4* 27.8* 25.0* 22.0* 24.8*  ?MCV 88.1 87.9 90.0 90.9  --  91.2  ?PLT 124* 199 198 257  --  346  ? ? ?Basic Metabolic Panel: ?Recent Labs  ?Lab 06/03/21 ?1344 06/03/21 ?1701 06/04/21 ?[id number] 06/04/21 ?1717 06/25/2021 ?0242 06/07/2021 ?[id number] 06/06/21 ?0530 06/07/21 ?0451 06/08/21 ?[id number] 06/09/21 ?[id number] 06/10/21 ?0148 06/10/21 ?[id number]  ?NA  --   --  138  --    < >  --  138 139 139 140 137 141  ?K  --   --  4.5  --    < > 4.3 3.7 3.1* 4.1 4.6 4.7 5.2*  ?CL  --   --  111  --    < >  --  97* 102 104 106  --  106  ?CO2  --   --  22  --    < >  --  31 29 28 29   --  30  ?GLUCOSE  --   --  128*  --    < >  --  104* 110* 136* 131*  --  117*  ?BUN  --   --  11  --    < >   --  8 16 20  24*  --  26*  ?CREATININE  --   --  1.11  --    < >  --  1.48* 1.55* 1.14 1.06  --  0.87  ?CALCIUM  --   --  7.5*  --    < >  --  8.5* 8.2* 8.3* 8.1*  --  8.4*  ?MG 2.1 1.8 1.7 1.9  --  1.7  --  1.5* 2.4  --   --   --   ?PHOS 1.9* 3.2 3.6 3.7  --   --   --   --  3.5  --   --   --   ? < > =  values in this interval not displayed.  ? ?GFR: ?Estimated Creatinine Clearance: 76.6 mL/min (by C-G formula based on SCr of 0.87 mg/dL). ?Recent Labs  ?Lab 06/03/21 ?[id number] 06/04/21 ?[id number] 06/07/21 ?0451 06/08/21 ?[id number] 06/09/21 ?[id number] 06/10/21 ?[id number]  ?WBC  --    < > 16.9* 21.2* 25.7* 27.7*  ?LATICACIDVEN 2.2*  --   --   --   --   --   ? < > = values in this interval not displayed.  ? ? ?Liver Function Tests: ?No results for input(s): AST, ALT, ALKPHOS, BILITOT, PROT, ALBUMIN in the last 168 hours. ?No results for input(s): LIPASE, AMYLASE in the last 168 hours. ?No results for input(s): AMMONIA in the last 168 hours. ? ?ABG ?   ?Component Value Date/Time  ? PHART 7.371 06/10/2021 0148  ? PCO2ART 56.7 (H) 06/10/2021 0148  ? PO2ART 76 (L) 06/10/2021 0148  ? HCO3 32.9 (H) 06/10/2021 0148  ? TCO2 35 (H) 06/10/2021 0148  ? ACIDBASEDEF 2.5 (H) 06/12/2021 0933  ? O2SAT 94 06/10/2021 0148  ?  ? ?Coagulation Profile: ?Recent Labs  ?Lab 06/28/2021 ?[id number]  ?INR 1.5*  ? ? ?Cardiac Enzymes: ?No results for input(s): CKTOTAL, CKMB, CKMBINDEX, TROPONINI in the last 168 hours. ? ?HbA1C: ?Hgb A1c MFr Bld  ?Date/Time Value Ref Range Status  ?06/07/2021 04:51 AM 5.2 4.8 - 5.6 % Final  ?  Comment:  ?  (NOTE) ?Pre diabetes:          5.7%-6.4% ? ?Diabetes:              >6.4% ? ?Glycemic control for   <7.0% ?adults with diabetes ?  ?06/05/2020 09:47 PM 5.4 4.8 - 5.6 % Final  ?  Comment:  ?  (NOTE) ?Pre diabetes:          5.7%-6.4% ? ?Diabetes:              >6.4% ? ?Glycemic control for   <7.0% ?adults with diabetes ?  ? ? ?CBG: ?Recent Labs  ?Lab 06/09/21 ?1111 06/09/21 ?1510 06/09/21 ?2044 06/09/21 ?2318 06/10/21 ?0310  ?GLUCAP 120* 131* 127*  127* 125*  ? ? ?Past Medical History:  ?He,  has a past medical history of Asthma, Asthma in adult, mild intermittent, uncomplicated ([phone]), Essential hypertension (06/05/2020), GERD with esophagitis

## 2021-06-10 NOTE — Progress Notes (Signed)
This chaplain responded to consult from family for additional spiritual care.  ? ?The chaplain attempted a visit and understands from the Pt. RN-Phyllis the Pt. mother has stepped away from the room and has plans to return later in the day. This chaplain is available for F/U spiritual care as needed. ? ?Chaplain Sallyanne Kuster ?[phone] ?

## 2021-06-10 NOTE — Progress Notes (Addendum)
Palliative: ? ?HPI: 61 y.o. male  with past medical history of multiple gastric surgeries, esophageal perforation, hiatal hernia, sepsis r/t C Diff, Barrett's esophagus, one kidney,  admitted on 05/11/2021 with shortness of breath, weakness. He had just had a colonoscopy. He was admitted and treatment started for sepsis related to pneumonia (likely aspiration). His condition has worsened requiring eventual intubation on 4/2. On 4/5 he started having seizures. EEG indicates severe diffuse encephalopathy of nonspecific etiology. Brain MRI showed small watershed infarcts but CTA with no large vessel stenosis or occlusions. His recovery has been complicated by sudden increases in his HR and RR requiring increased sedation. Most recent chest xray on 4/6 unchanged bilateral pneumonia. 4/10 significant decline with ARDS and 100% FiO2 vent support. Palliative medicine consulted for [place].  ? ?I came today to bedside multiple times throughout the day. Mr. Cohan has had significant decline today. I was finally able to have conversation with his mother and son utilizing Spanish tele interpreter services (daughter was via telephone). Mother is distraught and shares that she has lost many other children and she cannot bear to have another die. Son is very realistic and understands the severity of the situation. They express to me that he had a very similar hospitalization previously and had very poor progress and hospitalized >1 month and had trach but ultimately had full recovery. Son is able to express that this time is different. I explained the complicated fact of poor neurological status along with significantly worsening pulmonary status. I expressed serious concern that he has continued to worsen despite aggressive care. I discussed with them code status and expectation that he would not be expected to survive if his heart were to stop so we must weigh the benefits vs risks of the pain and suffering this could cause.  Ultimately they all agree that they do not wish for him to suffer. They agree with DNR but to continue full care otherwise to continue to give him every opportunity of improvement. I assured them that we will continue all efforts to support Mr. Bonar at this time according to their wishes while also providing medication to minimize suffering but to place DNR to prepare for worst case scenario.  ? ?Spoke further with son and answered his questions regarding progression and course of events to best of my knowledge. Son has just come into town last night. Discussed spiritual care support which he feels would be helpful to his mother. He shares that they are [place]. I did ask if they desired anointing of the sick and son is unsure but will let us know if this is desired. Son is aware that his father may not survive the night.  ? ?All questions/concerns addressed. Emotional support provided. Updated PCCM and RN.  ? ?Exam: Mostly unresponsive. No movements of extremities. He did raise eyebrows and attempt to open eyes when I called "Camari" but no other responses. Labored and tachypneic on 100% vent. Tachycardic. Warm to touch.  ? ?Plan: ?- DNR decided.  ?- Continue full scope treatment outside DNR.  ?- Spiritual care for further support.  ? ?60 min ? ?Vinie Sill, NP ?Palliative Medicine Team ?Pager [phone] (Please see [email or web address] for schedule) ?Team Phone [phone]  ? ? ?Greater than 50%  of this time was spent counseling and coordinating care related to the above assessment and plan   ?

## 2021-06-11 DIAGNOSIS — J9601 Acute respiratory failure with hypoxia: Secondary | ICD-10-CM | POA: Diagnosis not present

## 2021-06-11 DIAGNOSIS — G40901 Epilepsy, unspecified, not intractable, with status epilepticus: Secondary | ICD-10-CM | POA: Diagnosis not present

## 2021-06-11 DIAGNOSIS — R6520 Severe sepsis without septic shock: Secondary | ICD-10-CM | POA: Diagnosis not present

## 2021-06-11 DIAGNOSIS — G9341 Metabolic encephalopathy: Secondary | ICD-10-CM | POA: Diagnosis not present

## 2021-06-11 DIAGNOSIS — A419 Sepsis, unspecified organism: Secondary | ICD-10-CM | POA: Diagnosis not present

## 2021-06-11 DIAGNOSIS — Z7189 Other specified counseling: Secondary | ICD-10-CM | POA: Diagnosis not present

## 2021-06-11 LAB — POCT I-STAT 7, (LYTES, BLD GAS, ICA,H+H)
Acid-Base Excess: 6 mmol/L — ABNORMAL HIGH (ref 0.0–2.0)
Acid-Base Excess: 8 mmol/L — ABNORMAL HIGH (ref 0.0–2.0)
Bicarbonate: 35.8 mmol/L — ABNORMAL HIGH (ref 20.0–28.0)
Bicarbonate: 35.8 mmol/L — ABNORMAL HIGH (ref 20.0–28.0)
Calcium, Ion: 1.29 mmol/L (ref 1.15–1.40)
Calcium, Ion: 1.26 mmol/L (ref 1.15–1.40)
HCT: 25 % — ABNORMAL LOW (ref 39.0–52.0)
HCT: 23 % — ABNORMAL LOW (ref 39.0–52.0)
Hemoglobin: 8.5 g/dL — ABNORMAL LOW (ref 13.0–17.0)
Hemoglobin: 7.8 g/dL — ABNORMAL LOW (ref 13.0–17.0)
O2 Saturation: 100 %
O2 Saturation: 100 %
Patient temperature: 97.9
Patient temperature: 98.4
Potassium: 4.5 mmol/L (ref 3.5–5.1)
Potassium: 4.5 mmol/L (ref 3.5–5.1)
Sodium: 139 mmol/L (ref 135–145)
Sodium: 138 mmol/L (ref 135–145)
TCO2: 39 mmol/L — ABNORMAL HIGH (ref 22–32)
TCO2: 38 mmol/L — ABNORMAL HIGH (ref 22–32)
pCO2 arterial: 90.4 mmHg (ref 32–48)
pCO2 arterial: 75 mmHg (ref 32–48)
pH, Arterial: 7.203 — ABNORMAL LOW (ref 7.35–7.45)
pH, Arterial: 7.286 — ABNORMAL LOW (ref 7.35–7.45)
pO2, Arterial: 214 mmHg — ABNORMAL HIGH (ref 83–108)
pO2, Arterial: 205 mmHg — ABNORMAL HIGH (ref 83–108)

## 2021-06-11 LAB — CBC
HCT: 25.8 % — ABNORMAL LOW (ref 39.0–52.0)
Hemoglobin: 7.7 g/dL — ABNORMAL LOW (ref 13.0–17.0)
MCH: 28.5 pg (ref 26.0–34.0)
MCHC: 29.8 g/dL — ABNORMAL LOW (ref 30.0–36.0)
MCV: 95.6 fL (ref 80.0–100.0)
Platelets: 445 10*3/uL — ABNORMAL HIGH (ref 150–400)
RBC: 2.7 MIL/uL — ABNORMAL LOW (ref 4.22–5.81)
RDW: 19.2 % — ABNORMAL HIGH (ref 11.5–15.5)
WBC: 36.5 10*3/uL — ABNORMAL HIGH (ref 4.0–10.5)
nRBC: 0.1 % (ref 0.0–0.2)

## 2021-06-11 LAB — BASIC METABOLIC PANEL
Anion gap: 4 — ABNORMAL LOW (ref 5–15)
BUN: 29 mg/dL — ABNORMAL HIGH (ref 6–20)
CO2: 33 mmol/L — ABNORMAL HIGH (ref 22–32)
Calcium: 8.6 mg/dL — ABNORMAL LOW (ref 8.9–10.3)
Chloride: 102 mmol/L (ref 98–111)
Creatinine, Ser: 0.95 mg/dL (ref 0.61–1.24)
GFR, Estimated: >60 mL/min (ref >60)
Glucose, Bld: 149 mg/dL — ABNORMAL HIGH (ref 70–99)
Potassium: 4.6 mmol/L (ref 3.5–5.1)
Sodium: 139 mmol/L (ref 135–145)

## 2021-06-11 LAB — GLUCOSE, CAPILLARY
Glucose-Capillary: 146 mg/dL — ABNORMAL HIGH (ref 70–99)
Glucose-Capillary: 135 mg/dL — ABNORMAL HIGH (ref 70–99)
Glucose-Capillary: 129 mg/dL — ABNORMAL HIGH (ref 70–99)
Glucose-Capillary: 151 mg/dL — ABNORMAL HIGH (ref 70–99)
Glucose-Capillary: 137 mg/dL — ABNORMAL HIGH (ref 70–99)
Glucose-Capillary: 137 mg/dL — ABNORMAL HIGH (ref 70–99)

## 2021-06-11 LAB — TRIGLYCERIDES: Triglycerides: 248 mg/dL — ABNORMAL HIGH (ref <150)

## 2021-06-11 LAB — MAGNESIUM: Magnesium: 2.2 mg/dL (ref 1.7–2.4)

## 2021-06-11 LAB — LACTIC ACID, PLASMA
Lactic Acid, Venous: 1 mmol/L (ref 0.5–1.9)
Lactic Acid, Venous: 1.3 mmol/L (ref 0.5–1.9)

## 2021-06-11 MED ORDER — VECURONIUM BROMIDE 10 MG IV SOLR
0.1000 mg/kg | Freq: Once | INTRAVENOUS | Status: AC
  Administered 2021-06-11: 6.2 mg via INTRAVENOUS
  Filled 2021-06-11: qty 10

## 2021-06-11 MED ORDER — FUROSEMIDE 10 MG/ML IJ SOLN
20.0000 mg | Freq: Once | INTRAMUSCULAR | Status: AC
  Administered 2021-06-11: 20 mg via INTRAVENOUS
  Filled 2021-06-11: qty 2

## 2021-06-11 MED ORDER — SODIUM CHLORIDE 0.9 % IV SOLN: INTRAVENOUS | Status: DC | PRN

## 2021-06-11 NOTE — Progress Notes (Addendum)
This chaplain responded to PMT consult for EOL spiritual care for the Pt. mother-Colin Hamilton. At the time of the visit Colin Hamilton and the Pt. son-Colin Hamilton are at the bedside.  ? ?The Pt. is resting comfortably. Using [employer] as the interpreter and holding Colin Hamilton's hand, the chaplain introduced herself and began establishing rapport with [facility]. The chaplain understands Colin Hamilton is hopeful for healing. ? ?The chaplain understands Colin Hamilton is requesting a Catholic priest visit. The chaplain contacted Colin Hamilton and a visit is anticipated today. The chaplain accepted Colin Hamilton's request for prayer at the bedside. The chaplain will continue to offer a spiritual care presence. ? ?**[id number] This chaplain updated the medical team and phoned the family. Colin Hamilton plans to visit the Pt. Between 10:45-11:00am today. ? ?Chaplain Stephanie Acre ?[phone] ?

## 2021-06-11 NOTE — Progress Notes (Addendum)
Name: Colin Hamilton ?MRN: [id number] ?DOB: [birth year] ? ?DOS: 06/11/2021 ? ?PROCEDURE NOTE ? ?Procedure:  Arterial catheter placement. ? ?Indications:  Need for invasive hemodynamic monitoring / frequent arterial blood gases measurement. ? ?Consent:  Consent was obtained from the family (wife and son) at bedside.  ? ?Procedure summary:  The patient was identified as Colin Hamilton and safety timeout was performed. Collateral arterial flow was confirmed by performing Allen?s test. Sterile technique was used. The patient's right wrist was prepped using chlorhexidine / alcohol scrub and the field was draped in usual sterile fashion with protective barrier. The right radial artery was cannulated little difficulty with ultrasound guidance. Blood was aspirated and the catheter was flushed with normal saline without difficulty. Good arterial waveform was obtained. The catheter was secured into place with sterile dressing. ? ?Complications:  No immediate complications were noted. ? ?Estimated blood loss:  Minimal ? ?Gwenevere Abbot, M.D. ?IM[facility], PGY-1 ?Phone: [phone] ?Pager: [phone] ? ?06/11/2021, 12:44 PM  ?

## 2021-06-11 NOTE — Progress Notes (Signed)
? ?NAME:  Colin Hamilton, MRN:  [id number], DOB:  Apr 19, 1960, LOS: 107 ?ADMISSION DATE:  05/07/2021, CONSULTATION DATE:  06/10/21 ?REFERRING MD:  Cyndia Skeeters, TRH, CHIEF COMPLAINT:  resp distress    ? ?History of Present Illness:  ? ?Colin Hamilton is a 61M with barrett's esophagus, esophagectomy with gastric pull through, atrial fibrillation, HTN, asthma, and alcohol abuse who was admitted for shortness of breath and abdominal pain after colonoscopy. CT chest/abdomen/pelvis -nodular infiltrates especially in left lung concerning for aspiration pneumonia, large hiatal hernia versus gastric polyp, sigmoid diverticulosis, enhancement along right lateral bladder wall. Treated for sepsis and started on bipap. PCCM was consulted on 4/3 due to worsening respiratory distress and intubation requiring pressors. He started to have seizures on 4/5 and was transferred to [facility] for cEEG.  ?  ? ?Pertinent  Medical History  ? ?Barrett's esophagus ?Esophagectomy with gastric pull-through ?Solitary right kidney , CKD stage III yea ?Alcohol use ?Afib ?Asthma  ?HTN ? ?Significant Hospital Events: ?Including procedures, antibiotic start and stop dates in addition to other pertinent events   ?3/30 CT chest/abdomen/pelvis -nodular infiltrates especially in left lung, large hiatal hernia versus gastric polyp, sigmoid diverticulosis, enhancement along right lateral bladder wall, started on vanc, flagyl, cefepime ?3/31 Admitted to [place] ?4/1 abx changed to azithro, ceftriaxone, flagyl  ?4/2 ctx changed to cefepime ?4/3 PCCM consult, worsening resp distress> intubated, pressors  ?4/4 precedex added, remains on 50%/ 8 peep ?4/5 seizure; CT head/ ceribell/ MRI ordered  ?  ?3/30 SARS/ flu > neg ?3/31 MRSA PCR > neg ?3/30 UC > < 10k colonies, insignificant growth ?3/30 Bcx2 > ngtd ?3/31 urine strep> neg ?3/31 urine Legionella >  ?4/2 trach asp >candida albicans  ?4/10 Meropenem started  ?4/11 arterial line placement  ? ?Interim History / Subjective:  ? ?Over  night heavily sedated wit Fentanyl and propofol infusions at max doses.  ?Was given Vercuronium x2 for elevated peak pressure. Resolved after changing HME  ? ?Objective   ?Blood pressure (!) 96/56, pulse (!) 113, temperature 98.1 ?F (36.7 ?C), temperature source Esophageal, resp. rate (!) 30, height 5\' 4"  (1.626 m), weight 63.2 kg, SpO2 98 %. ?   ?Vent Mode: PRVC ?FiO2 (%):  [90 %-100 %] 90 % ?Set Rate:  [26 bmp-30 bmp] 30 bmp ?Vt Set:  [420 mL] 420 mL ?PEEP:  [8 cmH20] 8 cmH20 ?Plateau Pressure:  [28 cmH20-31 cmH20] 31 cmH20  ? ?Intake/Output Summary (Last 24 hours) at 06/11/2021 0705 ?Last data filed at 06/11/2021 [id number] ?Gross per 24 hour  ?Intake 3851.67 ml  ?Output 2475 ml  ?Net 1376.67 ml  ? ?Filed Weights  ? 06/09/21 0500 06/10/21 0500 06/11/21 0500  ?Weight: 62 kg 62 kg 63.2 kg  ? ? ?Examination: ?General: critically ill appearing, on ventilation ?Neuro: Sedated and non responsive  ?HENT: L scleral implant with L pupil > R ?Lungs: mechanically ventilated lung sounds ?Cardiovascular: tachycardic without murmurs ?Abdomen: soft, non distended  ?Extremities: warm, no edema  ? ?Assessment & Plan:  ? ?Acute hypoxic respiratory failure ?Septic shock ?Aspiration pneumonia/ pneumonitis  ?Likely underlying COPD versus history of asthma ?Former smoker ?Remains on full vent support, unable to be weaned due to mental status   ?Goal O2 sat of 88-95%  ?- Lasix 20mg  IV once  ?- Continue Meropenum  ?- Continue scheduled nebs, Brovana, Pulmicort  ?- Repeat blood culture  ?- Consider pan scanning afer 48 hours on Merepenem  ?- ABG  ?  ?Status epilepticus, frequent seizures despite 2 AEDs ?Acute metabolic  encephalopathy ?Watershed infarcts  ?- Neurology consulted, appreciate recommendations  ?- Continue Keppra and Vimpat  ?  ?Abdominal pain ?Large hiatal hernia ?Sigmoid diverticulosis with concern for sigmoid mass vs diverticular stricture ?Also with developing adhesions to bladder. Colonoscopy with biopsy performed in [facility] ?NG tube in place. EGD performed showing previous surgical anastomosis, Gastritis, A few duodenal polyps. ?- Surgery and GI signed off, will need outpatient follow-up ?- consider de escalating PPI  ?  ?CKD III ?Solitary right kidney  ?Cr stable at 0.95  ?- strict Is and Os ?- avoid nephrotoxic drugs ?  ?HLD  ?Triglycerides 248, HDL <10, VLDL 50 ?- Continue statin ?- Can still continue sedation  ?  ?Normocytic anemia ?Hgb stable at 7.7.  No acute signs of bleeding or need for transfusion  ?- continue to monitor  ?  ?Goals of care  ?Palliative following and is having ongoing discussions with family. Patient DNR now and still with full medical interventions. Per note would be amenable to tracheostomy if showing improvement but not to permanently artificially prolong life   ?- chaplain consulted to provide additional support for family  ? ?Best Practice (right click and "Reselect all SmartList Selections" daily)  ? ?Diet/type: NPO ?DVT prophylaxis: LMWH ?GI prophylaxis: PPI ?Lines: Arterial Line and yes and it is still needed ?Foley:  N/A ?Code Status:  DNR ? ?Labs   ?CBC: ?Recent Labs  ?Lab 06/07/21 ?0451 06/08/21 ?0313 06/09/21 ?[id number] 06/10/21 ?0148 06/10/21 ?[id number] 06/10/21 ?1615 06/10/21 ?2016 06/11/21 ?0414  ?WBC 16.9* 21.2* 25.7*  --  27.7*  --   --  36.5*  ?HGB 9.0* 8.8* 7.7* 7.5* 7.7* 9.2* 9.2* 7.7*  ?HCT 28.4* 27.8* 25.0* 22.0* 24.8* 27.0* 27.0* 25.8*  ?MCV 87.9 90.0 90.9  --  91.2  --   --  95.6  ?PLT 199 198 257  --  346  --   --  445*  ? ? ?Basic Metabolic Panel: ?Recent Labs  ?Lab 06/04/21 ?1717 06/03/2021 ?0242 06/29/2021 ?[id number] 06/06/21 ?0530 06/07/21 ?0451 06/08/21 ?[id number] 06/09/21 ?[id number] 06/10/21 ?0148 06/10/21 ?[id number] 06/10/21 ?1249 06/10/21 ?1615 06/10/21 ?2016 06/11/21 ?0414  ?NA  --    < >  --    < > 139 139 140   < > 141 140 139 140 139  ?K  --    < > 4.3   < > 3.1* 4.1 4.6   < > 5.2* 4.6 4.8 4.7 4.6  ?CL  --    < >  --    < > 102 104 106  --  106 103  --   --  102  ?CO2  --    < >  --    < > 29 28 29   --   30 31  --   --  33*  ?GLUCOSE  --    < >  --    < > 110* 136* 131*  --  117* 176*  --   --  149*  ?BUN  --    < >  --    < > 16 20 24*  --  26* 27*  --   --  29*  ?CREATININE  --    < >  --    < > 1.55* 1.14 1.06  --  0.87 1.03  --   --  0.95  ?CALCIUM  --    < >  --    < > 8.2* 8.3* 8.1*  --  8.4* 8.3*  --   --  8.6*  ?MG 1.9  --  1.7  --  1.5* 2.4  --   --   --   --   --   --  2.2  ?PHOS 3.7  --   --   --   --  3.5  --   --   --   --   --   --   --   ? < > = values in this interval not displayed.  ? ?GFR: ?Estimated Creatinine Clearance: 70.1 mL/min (by C-G formula based on SCr of 0.95 mg/dL). ?Recent Labs  ?Lab 06/08/21 ?0313 06/09/21 ?0414 06/10/21 ?[id number] 06/11/21 ?0414  ?WBC 21.2* 25.7* 27.7* 36.5*  ? ? ?Liver Function Tests: ?No results for input(s): AST, ALT, ALKPHOS, BILITOT, PROT, ALBUMIN in the last 168 hours. ?No results for input(s): LIPASE, AMYLASE in the last 168 hours. ?No results for input(s): AMMONIA in the last 168 hours. ? ?ABG ?   ?Component Value Date/Time  ? PHART 7.268 (L) 06/10/2021 2016  ? PCO2ART 74.8 ([place]) 06/10/2021 2016  ? PO2ART 60 (L) 06/10/2021 2016  ? HCO3 34.2 (H) 06/10/2021 2016  ? TCO2 36 (H) 06/10/2021 2016  ? ACIDBASEDEF 2.5 (H) 06/09/2021 0933  ? O2SAT 86 06/10/2021 2016  ?  ? ?Coagulation Profile: ?Recent Labs  ?Lab 06/14/2021 ?[id number]  ?INR 1.5*  ? ? ?Cardiac Enzymes: ?No results for input(s): CKTOTAL, CKMB, CKMBINDEX, TROPONINI in the last 168 hours. ? ?HbA1C: ?Hgb A1c MFr Bld  ?Date/Time Value Ref Range Status  ?06/07/2021 04:51 AM 5.2 4.8 - 5.6 % Final  ?  Comment:  ?  (NOTE) ?Pre diabetes:          5.7%-6.4% ? ?Diabetes:              >6.4% ? ?Glycemic control for   <7.0% ?adults with diabetes ?  ?06/05/2020 09:47 PM 5.4 4.8 - 5.6 % Final  ?  Comment:  ?  (NOTE) ?Pre diabetes:          5.7%-6.4% ? ?Diabetes:              >6.4% ? ?Glycemic control for   <7.0% ?adults with diabetes ?  ? ? ?CBG: ?Recent Labs  ?Lab 06/10/21 ?1107 06/10/21 ?1509 06/10/21 ?1921 06/10/21 ?2253  06/11/21 ?[id number]  ?GLUCAP 130* 112* 125* 151* 146*  ? ? ?Past Medical History:  ?He,  has a past medical history of Asthma, Asthma in adult, mild intermittent, uncomplicated (0000000), Essential hypertension (4/5/

## 2021-06-11 NOTE — Progress Notes (Signed)
eLink Physician-Brief Progress Note ?Patient Name: Brookes Craine ?DOB: 1960-07-31 ?MRN: [id number] ? ? ?Date of Service ? 06/11/2021  ?HPI/Events of Note ? Ppeak now back in 40's. Airway pressures decreased with NMB trial earlier. Remains well sedated with Fentanyl and Propofol IV infusions.  ?eICU Interventions ? Plan: ?Vecuronium 6.2 mg IV X 1.   ? ? ? ?Intervention Category ?Major Interventions: Respiratory failure - evaluation and management ? ?Aradia Estey Dennard Nip ?06/11/2021, 2:02 AM ?

## 2021-06-11 NOTE — Progress Notes (Signed)
? ?                                                                                                                                                     ?                                                   ?Daily Progress Note  ? ?Patient Name: Colin Hamilton       Date: 06/11/2021 ?DOB: April 12, 1960  Age: 61 y.o. MRN#: [id number] ?Attending Physician: Margaretha Seeds, MD ?Primary Care Physician: Pcp, No ?Admit Date: 05/27/2021 ? ?Reason for Consultation/Follow-up: Establishing goals of care ? ?Patient Profile/HPI: 61 y.o. male  with past medical history of multiple gastric surgeries, esophageal perforation, hiatal hernia, sepsis r/t C Diff, Barrett's esophagus, one kidney,  admitted on 05/12/2021 with shortness of breath, weakness. He had just had a colonoscopy. He was admitted and treatment started for sepsis related to pneumonia (likely aspiration). His condition has worsened requiring eventual intubation on 4/2. On 4/5 he started having seizures. EEG indicates severe diffuse encephalopathy of nonspecific etiology. Brain MRI showed small watershed infarcts but CTA with no large vessel stenosis or occlusions. His recovery has been complicated by sudden increases in his HR and RR requiring increased sedation. Most recent chest xray on 4/6 unchanged bilateral pneumonia. Palliative medicine consulted for [place].   ? ?Subjective: ?Chart reviewed including labs, progress notes, imaging. Patient discussed with Dr. Loanne Drilling. WBC increased. Chest xray yesterday showed persistent extensive airspace disease. Per Dr. Loanne Drilling, oxgenation improving today. Mental status remains a challenge to prognosticate.  ?Continues to be sedated.  ?Made DNR yesterday.  ?Spoke with son via phone- no family at bedside.  ?He was surprised that his Dad survived through the night. Discussed with Darrick Meigs that we are doing many things to prolong his body functions- many artificially- but agreed that his Dad is at risk of sudden decompensation.  ?Christian  and patient's mother continue to be hopeful for improvement.  ? ?Review of Systems  ?Unable to perform ROS: Intubated  ? ? ?Physical Exam ?Vitals and nursing note reviewed.  ?Constitutional:   ?   Appearance: He is ill-appearing.  ?Pulmonary:  ?   Comments: intubated ?Neurological:  ?   Comments: sedated  ?         ? ?Vital Signs: BP (!) 101/56   Pulse (!) 110   Temp 98.4 ?F (36.9 ?C) (Esophageal)   Resp (!) 30   Ht 5\' 4"  (1.626 m)   Wt 63.2 kg   SpO2 97%   BMI 23.92 kg/m?  ?SpO2: SpO2: 97 % ?O2 Device: O2 Device: Ventilator ?O2 Flow Rate: O2 Flow Rate (L/min): 60 L/min ? ?  Intake/output summary:  ?Intake/Output Summary (Last 24 hours) at 06/11/2021 1606 ?Last data filed at 06/11/2021 1600 ?Gross per 24 hour  ?Intake 4350.76 ml  ?Output 1775 ml  ?Net 2575.76 ml  ? ?LBM: Last BM Date : 06/10/21 ?Baseline Weight: Weight: 55.3 kg ?Most recent weight: Weight: 63.2 kg ? ?     ?Palliative Assessment/Data: PPS: 10% ? ? ? ? ? ?Patient Active Problem List  ? Diagnosis Date Noted  ? Cerebral embolism with cerebral infarction 06/10/2021  ? Pressure injury of skin 06/10/2021  ? Acute metabolic encephalopathy [id number]  ? Thrombocytopenia ([place]) 06/02/2021  ? Aspiration pneumonia ([place]) 06/01/2021  ? 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 Abnormal CT of the abdomen 06/01/2021  ? Lactic acidosis 06/01/2021  ? Hyponatremia 06/01/2021  ? Hyponatremia, hypokalemia and hypomagnesemia 06/01/2021  ? Respiratory distress 06/01/2021  ? Acute respiratory failure ([place]) 05/31/2021  ? Anxiety 05/31/2021  ? Hypomagnesemia 05/31/2021  ? Protein-calorie malnutrition, severe 07/23/2020  ? Irregular cardiac rhythm 07/21/2020  ? Mild renal insufficiency 07/21/2020  ? Severe sepsis due to aspiration pneumonia 07/21/2020  ? Abdominal pain   ? Sepsis secondary to UTI ([place]) 06/05/2020  ? Hypokalemia 06/05/2020  ? Sinus tachycardia 06/05/2020  ? GERD with esophagitis 06/05/2020  ? History of alcohol abuse 06/05/2020  ? Essential  hypertension 06/05/2020  ? Intractable nausea and vomiting 06/05/2020  ? Asthma in adult, mild intermittent, uncomplicated [id number]  ? Sigmoid diverticulitis 06/05/2020  ? ? ?Palliative Care Assessment & Plan  ? ? ?Assessment/Recommendations/Plan ? ?Continue full scope ?DNR ?Plan for in person meeting tomorrow at 11am ? ? ?Code Status: ?DNR ? ?Prognosis: ? Unable to determine ? ?Discharge Planning: ?To Be Determined ? ?Care plan was discussed with patient's son and care team.  ? ?Thank you for allowing the Palliative Medicine Team to assist in the care of this patient. ? ? ?Mariana Kaufman, AGNP-C ?Palliative Medicine ? ? ?Please contact Palliative Medicine Team phone at [phone] for questions and concerns.  ? ? ? ? ? ? ?

## 2021-06-11 NOTE — Progress Notes (Signed)
Pharmacy Antibiotic Note ? ?Colin Hamilton is a 61 y.o. male admitted on 05/09/2021 with seizures and now with concern for pneumonia.  Pharmacy has been consulted for Merrem dosing. ? ?Plan: ?Merrem 1g IV every 8 hours. ?Monitor renal function, culture results and clinical status.  ? ?Height: 5\' 4"  (162.6 cm) ?Weight: 63.2 kg (139 lb 5.3 oz) ?IBW/kg (Calculated) : 59.2 ? ?Temp (24hrs), Avg:98.4 ?F (36.9 ?C), Min:97.9 ?F (36.6 ?C), Max:99.1 ?F (37.3 ?C) ? ?Recent Labs  ?Lab 06/07/21 ?0451 06/08/21 ?0313 06/09/21 ?0414 06/10/21 ?08/10/21 06/10/21 ?1249 06/11/21 ?0414  ?WBC 16.9* 21.2* 25.7* 27.7*  --  36.5*  ?CREATININE 1.55* 1.14 1.06 0.87 1.03 0.95  ?  ?Estimated Creatinine Clearance: 70.1 mL/min (by C-G formula based on SCr of 0.95 mg/dL).   ? ?Allergies  ?Allergen Reactions  ? Penicillins Swelling  ? ? ?Antimicrobials this admission: ?Cefepime 3/30> 4/1  4/2>>4/3 ?Flagyl 3/30 x 1 , 4/1>> 4/3 ?Vanc 3/30> 3/31 ?4/1 azith>>4/3 ?4/1 CTX x 1, 4/3>> 4/9 ?4/10 Merrem >> ? ?Dose adjustments this admission: ? ? ?Microbiology results: ?3/31 MRSA neg ?3/31 strep pneumo neg ?3/30 BCx ngtd ?3/30 UCx insig growth F ?4/2 TA: candida albicans ?4/10 TA: prelim NGTD  ? ?Thank you for allowing pharmacy to be a part of this patient?s care. ? ?4/30 ?06/10/21 - Posted note late ? ?

## 2021-06-12 DIAGNOSIS — A419 Sepsis, unspecified organism: Secondary | ICD-10-CM | POA: Diagnosis not present

## 2021-06-12 DIAGNOSIS — R6520 Severe sepsis without septic shock: Secondary | ICD-10-CM | POA: Diagnosis not present

## 2021-06-12 DIAGNOSIS — R4182 Altered mental status, unspecified: Secondary | ICD-10-CM

## 2021-06-12 DIAGNOSIS — G40901 Epilepsy, unspecified, not intractable, with status epilepticus: Secondary | ICD-10-CM | POA: Diagnosis not present

## 2021-06-12 DIAGNOSIS — G9341 Metabolic encephalopathy: Secondary | ICD-10-CM | POA: Diagnosis not present

## 2021-06-12 DIAGNOSIS — Z7189 Other specified counseling: Secondary | ICD-10-CM | POA: Diagnosis not present

## 2021-06-12 LAB — POCT I-STAT 7, (LYTES, BLD GAS, ICA,H+H)
Acid-Base Excess: 7 mmol/L — ABNORMAL HIGH (ref 0.0–2.0)
Acid-Base Excess: 10 mmol/L — ABNORMAL HIGH (ref 0.0–2.0)
Acid-Base Excess: 10 mmol/L — ABNORMAL HIGH (ref 0.0–2.0)
Bicarbonate: 34.8 mmol/L — ABNORMAL HIGH (ref 20.0–28.0)
Bicarbonate: 36.9 mmol/L — ABNORMAL HIGH (ref 20.0–28.0)
Bicarbonate: 36.6 mmol/L — ABNORMAL HIGH (ref 20.0–28.0)
Calcium, Ion: 1.32 mmol/L (ref 1.15–1.40)
Calcium, Ion: 1.3 mmol/L (ref 1.15–1.40)
Calcium, Ion: 1.34 mmol/L (ref 1.15–1.40)
HCT: 25 % — ABNORMAL LOW (ref 39.0–52.0)
HCT: 24 % — ABNORMAL LOW (ref 39.0–52.0)
HCT: 25 % — ABNORMAL LOW (ref 39.0–52.0)
Hemoglobin: 8.5 g/dL — ABNORMAL LOW (ref 13.0–17.0)
Hemoglobin: 8.2 g/dL — ABNORMAL LOW (ref 13.0–17.0)
Hemoglobin: 8.5 g/dL — ABNORMAL LOW (ref 13.0–17.0)
O2 Saturation: 99 %
O2 Saturation: 99 %
O2 Saturation: 95 %
Patient temperature: 36.7
Patient temperature: 97.7
Patient temperature: 36.7
Potassium: 4.5 mmol/L (ref 3.5–5.1)
Potassium: 4.2 mmol/L (ref 3.5–5.1)
Potassium: 4.3 mmol/L (ref 3.5–5.1)
Sodium: 138 mmol/L (ref 135–145)
Sodium: 139 mmol/L (ref 135–145)
Sodium: 139 mmol/L (ref 135–145)
TCO2: 37 mmol/L — ABNORMAL HIGH (ref 22–32)
TCO2: 39 mmol/L — ABNORMAL HIGH (ref 22–32)
TCO2: 38 mmol/L — ABNORMAL HIGH (ref 22–32)
pCO2 arterial: 69.7 mmHg (ref 32–48)
pCO2 arterial: 67.2 mmHg (ref 32–48)
pCO2 arterial: 60.4 mmHg — ABNORMAL HIGH (ref 32–48)
pH, Arterial: 7.304 — ABNORMAL LOW (ref 7.35–7.45)
pH, Arterial: 7.345 — ABNORMAL LOW (ref 7.35–7.45)
pH, Arterial: 7.389 (ref 7.35–7.45)
pO2, Arterial: 153 mmHg — ABNORMAL HIGH (ref 83–108)
pO2, Arterial: 141 mmHg — ABNORMAL HIGH (ref 83–108)
pO2, Arterial: 78 mmHg — ABNORMAL LOW (ref 83–108)

## 2021-06-12 LAB — BASIC METABOLIC PANEL
Anion gap: 5 (ref 5–15)
BUN: 28 mg/dL — ABNORMAL HIGH (ref 6–20)
CO2: 32 mmol/L (ref 22–32)
Calcium: 9.1 mg/dL (ref 8.9–10.3)
Chloride: 101 mmol/L (ref 98–111)
Creatinine, Ser: 0.9 mg/dL (ref 0.61–1.24)
GFR, Estimated: >60 mL/min (ref >60)
Glucose, Bld: 157 mg/dL — ABNORMAL HIGH (ref 70–99)
Potassium: 4.3 mmol/L (ref 3.5–5.1)
Sodium: 138 mmol/L (ref 135–145)

## 2021-06-12 LAB — TRIGLYCERIDES: Triglycerides: 232 mg/dL — ABNORMAL HIGH (ref <150)

## 2021-06-12 LAB — CBC
HCT: 24.9 % — ABNORMAL LOW (ref 39.0–52.0)
Hemoglobin: 7.3 g/dL — ABNORMAL LOW (ref 13.0–17.0)
MCH: 27.5 pg (ref 26.0–34.0)
MCHC: 29.3 g/dL — ABNORMAL LOW (ref 30.0–36.0)
MCV: 94 fL (ref 80.0–100.0)
Platelets: 555 10*3/uL — ABNORMAL HIGH (ref 150–400)
RBC: 2.65 MIL/uL — ABNORMAL LOW (ref 4.22–5.81)
RDW: 18.8 % — ABNORMAL HIGH (ref 11.5–15.5)
WBC: 31.2 10*3/uL — ABNORMAL HIGH (ref 4.0–10.5)
nRBC: 0.1 % (ref 0.0–0.2)

## 2021-06-12 LAB — GLUCOSE, CAPILLARY
Glucose-Capillary: 132 mg/dL — ABNORMAL HIGH (ref 70–99)
Glucose-Capillary: 136 mg/dL — ABNORMAL HIGH (ref 70–99)
Glucose-Capillary: 109 mg/dL — ABNORMAL HIGH (ref 70–99)
Glucose-Capillary: 129 mg/dL — ABNORMAL HIGH (ref 70–99)
Glucose-Capillary: 123 mg/dL — ABNORMAL HIGH (ref 70–99)

## 2021-06-12 LAB — MAGNESIUM: Magnesium: 2.1 mg/dL (ref 1.7–2.4)

## 2021-06-12 MED ORDER — PROSOURCE TF PO LIQD
90.0000 mL | Freq: Two times a day (BID) | ORAL | Status: DC
  Administered 2021-06-12 – 2021-06-13 (×2): 90 mL
  Filled 2021-06-12 (×2): qty 90

## 2021-06-12 MED ORDER — FUROSEMIDE 10 MG/ML IJ SOLN
20.0000 mg | Freq: Two times a day (BID) | INTRAMUSCULAR | Status: AC
  Administered 2021-06-12 (×2): 20 mg via INTRAVENOUS
  Filled 2021-06-12 (×2): qty 2

## 2021-06-12 MED ORDER — PANTOPRAZOLE 2 MG/ML SUSPENSION
40.0000 mg | Freq: Every day | ORAL | Status: DC
  Administered 2021-06-13: 40 mg
  Filled 2021-06-12: qty 20

## 2021-06-12 NOTE — Progress Notes (Signed)
? ?NAME:  Colin Hamilton, MRN:  [id number], DOB:  05-13-1960, LOS: 12 ?ADMISSION DATE:  05/28/2021, CONSULTATION DATE:  06/17/2021 ?REFERRING MD:  Cyndia Skeeters, TRH, CHIEF COMPLAINT:  resp distress    ? ?History of Present Illness:  ? ?Colin Hamilton is a 13M with barrett's esophagus, esophagectomy with gastric pull through, atrial fibrillation, HTN, asthma, and alcohol abuse who was admitted for shortness of breath and abdominal pain after colonoscopy. CT chest/abdomen/pelvis -nodular infiltrates especially in left lung concerning for aspiration pneumonia, large hiatal hernia versus gastric polyp, sigmoid diverticulosis, enhancement along right lateral bladder wall. Treated for sepsis and started on bipap. PCCM was consulted on 4/3 due to worsening respiratory distress and intubation requiring pressors. He started to have seizures on 4/5 and was transferred to [facility] for cEEG.  ? ?Pertinent  Medical History  ? ?Barrett's esophagus ?Esophagectomy with gastric pull-through ?Solitary right kidney , CKD stage III yea ?Alcohol use ?Afib ?Asthma  ?HTN ? ?Significant Hospital Events: ?Including procedures, antibiotic start and stop dates in addition to other pertinent events   ?3/30 CT chest/abdomen/pelvis -nodular infiltrates especially in left lung, large hiatal hernia versus gastric polyp, sigmoid diverticulosis, enhancement along right lateral bladder wall, started on vanc, flagyl, cefepime ?3/31 Admitted to [facility] ?4/1 abx changed to azithro, ceftriaxone, flagyl  ?4/2 ctx changed to cefepime ?4/3 PCCM consult, worsening resp distress> intubated, pressors  ?4/4 precedex added, remains on 50%/ 8 peep ?4/5 seizure; CT head/ ceribell/ MRI ordered  ?  ?3/30 SARS/ flu > neg ?3/31 MRSA PCR > neg ?3/30 UC > < 10k colonies, insignificant growth ?3/30 Bcx2 > ngtd ?3/31 urine strep> neg ?3/31 urine Legionella >  ?4/2 trach asp >candida albicans  ?4/10 Meropenem started  ?4/11 arterial line placement  ?  ? ?Interim History / Subjective:  ? ?No  acute events overnight. Yesterday was able to wean on vent, currently at 70% FiO2, peep 8, RR 30, TV 470, peak pressure 35 ?Continues to be sedated and on pressers: on Propofol (16.7), Levo (45), Fentanyl (30). Received a dose of Versed earlier this morning.  ? ?In person palliative meeting today 11am ? ?Labs: hgb stable at 7.3, triglycerides stable at 232 ? ?Objective   ?Blood pressure 104/60, pulse (!) 108, temperature 98.1 ?F (36.7 ?C), temperature source Esophageal, resp. rate (!) 28, height 5\' 4"  (1.626 m), weight 64.3 kg, SpO2 100 %. ?CVP:  [8 mmHg] 8 mmHg  ?Vent Mode: PRVC ?FiO2 (%):  [70 %-90 %] 70 % ?Set Rate:  [30 bmp] 30 bmp ?Vt Set:  [420 mL-470 mL] 470 mL ?PEEP:  [8 cmH20] 8 cmH20 ?Plateau Pressure:  [19 cmH20-32 cmH20] 19 cmH20  ? ?Intake/Output Summary (Last 24 hours) at 06/12/2021 0713 ?Last data filed at 06/12/2021 [phone] ?Gross per 24 hour  ?Intake 4049.59 ml  ?Output 2710 ml  ?Net 1339.59 ml  ? ?Filed Weights  ? 06/10/21 0500 06/11/21 0500 06/12/21 0436  ?Weight: 62 kg 63.2 kg 64.3 kg  ? ? ?Examination: ?General: critically ill appearing, on ventilation  ?HENT: L scleral implant with L pupil > R ?Lungs: mechanically ventilated lung sounds ?Cardiovascular: tachycardic without murmurs ?Abdomen: soft, non distended    ?Extremities: warm. L hand edema  ?Neuro: Sedated and non responsive  ? ? ?Assessment & Plan:  ? ?Acute hypoxic respiratory failure ?Septic shock ?Aspiration pneumonia/ pneumonitis  ?Likely underlying COPD versus history of asthma ?Former smoker ?Arterial line placed yesterday  ?Remains on full vent support, was able to be weaned yesterday currently at 70% FiO2, peep  8, RR 30, TV 470, peak pressure 35 ?Goal O2 sat of 88-95%  ?Currently on Levophed, wean for MAP 65 ?Received Lasix 20mg  IV once 2.5L urine output yesterday. Still net positive since admission  ?- Continue Meropenum  ?- Continue scheduled nebs, Brovana, Pulmicort  ?- Consider pan scanning afer 48 hours on Merepenem  ?- Consider  another Lasix dose today  ?- repeat ABG noon  ?  ?Status epilepticus, frequent seizures despite 2 AEDs ?Acute metabolic encephalopathy ?Watershed infarcts  ?- Neurology consulted, appreciate recommendations  ?- Continue Keppra and Vimpat  ?  ?Abdominal pain ?Large hiatal hernia ?Sigmoid diverticulosis with concern for sigmoid mass vs diverticular stricture ?Also with developing adhesions to bladder. Colonoscopy with biopsy performed in [facility] ?NG tube in place. EGD performed showing previous surgical anastomosis, Gastritis, A few duodenal polyps. ?- Surgery and GI signed off, will need outpatient follow-up ?- consider de escalating PPI  ?  ?CKD III ?Solitary right kidney  ?Cr stable at 0.90  ?- strict Is and Os ?- avoid nephrotoxic drugs ?  ?HLD  ?Triglycerides 232, HDL <10, VLDL 50 ?- Continue statin ?- Can still continue sedation  ?  ?Normocytic anemia ?Hgb stable at 7.7.  No acute signs of bleeding or need for transfusion  ?- continue to monitor  ?  ?Goals of care  ?Palliative following and is having ongoing discussions with family. Patient DNR now and still with full medical interventions. Per note would be amenable to tracheostomy if showing improvement but not to permanently artificially prolong life   ?- chaplain consulted to provide additional support for family  ?- palliative in person family meeting at 11am  ?  ? ?Best Practice (right click and "Reselect all SmartList Selections" daily)  ? ?Diet/type: NPO ?DVT prophylaxis: LMWH ?GI prophylaxis: PPI ?Lines: Arterial Line ?Foley:  Yes, and it is still needed ?Code Status:  DNR ? ?Labs   ?CBC: ?Recent Labs  ?Lab 06/08/21 ?0313 06/09/21 ?0414 06/10/21 ?0148 06/10/21 ?[id number] 06/10/21 ?1615 06/11/21 ?0414 06/11/21 ?1141 06/11/21 ?1522 06/12/21 ?0422 06/12/21 ?0431  ?WBC 21.2* 25.7*  --  27.7*  --  36.5*  --   --  31.2*  --   ?HGB 8.8* 7.7*   < > 7.7*   < > 7.7* 8.5* 7.8* 7.3* 8.5*  ?HCT 27.8* 25.0*   < > 24.8*   < > 25.8* 25.0* 23.0* 24.9* 25.0*  ?MCV 90.0  90.9  --  91.2  --  95.6  --   --  94.0  --   ?PLT 198 257  --  346  --  445*  --   --  555*  --   ? < > = values in this interval not displayed.  ? ? ?Basic Metabolic Panel: ?Recent Labs  ?Lab 06/26/2021 ?0958 06/06/21 ?0530 06/07/21 ?0451 06/08/21 ?[id number] 06/09/21 ?[id number] 06/10/21 ?0148 06/10/21 ?[id number] 06/10/21 ?1249 06/10/21 ?1615 06/11/21 ?0414 06/11/21 ?1141 06/11/21 ?1522 06/12/21 ?0422 06/12/21 ?0431  ?NA  --    < > 139 139 140   < > 141 140   < > 139 139 138 138 138  ?K 4.3   < > 3.1* 4.1 4.6   < > 5.2* 4.6   < > 4.6 4.5 4.5 4.3 4.5  ?CL  --    < > 102 104 106  --  106 103  --  102  --   --  101  --   ?CO2  --    < > 29 28 29   --  30 31  --  33*  --   --  32  --   ?GLUCOSE  --    < > 110* 136* 131*  --  117* 176*  --  149*  --   --  157*  --   ?BUN  --    < > 16 20 24*  --  26* 27*  --  29*  --   --  28*  --   ?CREATININE  --    < > 1.55* 1.14 1.06  --  0.87 1.03  --  0.95  --   --  0.90  --   ?CALCIUM  --    < > 8.2* 8.3* 8.1*  --  8.4* 8.3*  --  8.6*  --   --  9.1  --   ?MG 1.7  --  1.5* 2.4  --   --   --   --   --  2.2  --   --  2.1  --   ?PHOS  --   --   --  3.5  --   --   --   --   --   --   --   --   --   --   ? < > = values in this interval not displayed.  ? ?GFR: ?Estimated Creatinine Clearance: 74 mL/min (by C-G formula based on SCr of 0.9 mg/dL). ?Recent Labs  ?Lab 06/09/21 ?0414 06/10/21 ?[id number] 06/11/21 ?0414 06/11/21 ?1003 06/11/21 ?1531 06/12/21 ?0422  ?WBC 25.7* 27.7* 36.5*  --   --  31.2*  ?LATICACIDVEN  --   --   --  1.0 1.3  --   ? ? ?Liver Function Tests: ?No results for input(s): AST, ALT, ALKPHOS, BILITOT, PROT, ALBUMIN in the last 168 hours. ?No results for input(s): LIPASE, AMYLASE in the last 168 hours. ?No results for input(s): AMMONIA in the last 168 hours. ? ?ABG ?   ?Component Value Date/Time  ? PHART 7.304 (L) 06/12/2021 0431  ? PCO2ART 69.7 (HH) 06/12/2021 0431  ? PO2ART 153 (H) 06/12/2021 0431  ? HCO3 34.8 (H) 06/12/2021 0431  ? TCO2 37 (H) 06/12/2021 0431  ? ACIDBASEDEF 2.5 (H)  06/26/2021 0933  ? O2SAT 99 06/12/2021 0431  ?  ? ?Coagulation Profile: ?Recent Labs  ?Lab 06/10/2021 ?[id number]  ?INR 1.5*  ? ? ?Cardiac Enzymes: ?No results for input(s): CKTOTAL, CKMB, CKMBINDEX, TROPONINI in the la

## 2021-06-12 NOTE — Progress Notes (Signed)
Nutrition Follow-up ? ?DOCUMENTATION CODES:  ? ?Not applicable ? ?INTERVENTION:  ? ?Continue tube feeds via NG tube: ?- Vital 1.5 @ 40 ml/hr (960 ml/day) ?- ProSource TF 90 ml BID ? ?Tube feeding regimen provides 1600 kcal, 109 grams of protein, and 733 ml of H2O. ? ?Tube feeding regimen and current propofol provides 2091 total kcal (>100% of needs) ? ?- Continue MVI with minerals daily per tube ? ?NUTRITION DIAGNOSIS:  ? ?Inadequate oral intake related to inability to eat as evidenced by NPO status. ? ?Ongoing ? ?GOAL:  ? ?Patient will meet greater than or equal to 90% of their needs ? ?Met via TF  ? ?MONITOR:  ? ?Vent status, TF tolerance, Weight trends, Labs, I & O's ? ?REASON FOR ASSESSMENT:  ? ?Ventilator, Consult ?Enteral/tube feeding initiation and management ? ?ASSESSMENT:  ? ?61 y.o. male with medical history of HTN, asthma, GERD, anxiety, hx of alcohol abuse, and Barrett's esophagus s/p esophagectomy with gastric pull through. He presented to the ED due to dyspnea following a colonoscopy the day prior. He was also experiencing abdominal pain after the procedure. He was admitted with severe sepsis due to aspiration pneumonia. ? ?04/05 - NG tube placed by GI surgeon ? ?Pt tolerating current tube feeds without issue. Pt remains on propofol. Palliative Care discussions ongoing regarding [place]. Will continue current tube feeding regimen at this time. ? ?Admit weight: 55.3 kg ?Current weight: 64.3 kg ? ?Pt with mild pitting edema to BUE and BLE. Suspect weight gain is related to positive fluid balance. ? ?Current TF: Vital 1.5 @ 40 ml/hr, ProSource TF 90 ml TID ? ?Patient remains intubated on ventilator support ?MV: 14 L/min ?Temp (24hrs), Avg:98 ?F (36.7 ?C), Min:97.5 ?F (36.4 ?C), Max:98.4 ?F (36.9 ?C) ? ?Drips: ?Propofol: 18.6 ml/hr (provides 491 kcal daily) ?Fentanyl ?Levophed ? ?Medications reviewed and include: colace, IV lasix, protonix, miralax, IV abx ? ?Labs reviewed: BUN 28, TG 232, WBC 31.2, hemoglobin  8.2 ?CBG's: 109-151 x 24 hours ? ?UOP: 2510 ml x 24 hours ?Stool: 325 ml x 24 hours ?I/O's: +8.9 L since admit ? ?Diet Order:   ?Diet Order   ? ?       ?  Diet NPO time specified  Diet effective midnight       ?  ? ?  ?  ? ?  ? ? ?EDUCATION NEEDS:  ? ?No education needs have been identified at this time ? ?Skin:  Skin Assessment: ?Skin Integrity Issues: ?Stage II: face ? ?Last BM:  06/12/21 type 7 via rectal tube ? ?Height:  ? ?Ht Readings from Last 1 Encounters:  ?06/02/21 '5\' 4"'  (1.626 m)  ? ? ?Weight:  ? ?Wt Readings from Last 1 Encounters:  ?06/12/21 64.3 kg  ? ? ?Ideal Body Weight:  59.1 kg ? ?BMI:  Body mass index is 24.33 kg/m?. ? ?Estimated Nutritional Needs:  ? ?Kcal:  1600-1800 ? ?Protein:  90-110 gm ? ?Fluid:  >/= 1.8 L ? ? ? ?Gustavus Bryant, MS, RD, LDN ?Inpatient Clinical Dietitian ?Please see AMiON for contact information. ? ?

## 2021-06-12 NOTE — Progress Notes (Signed)
? ?                                                                                                                                                     ?                                                   ?Daily Progress Note  ? ?Patient Name: Colin Hamilton       Date: 06/12/2021 ?DOB: 27-Jun-1960  Age: 61 y.o. MRN#: [id number] ?Attending Physician: Margaretha Seeds, MD ?Primary Care Physician: Pcp, No ?Admit Date: 05/20/2021 ? ?Reason for Consultation/Follow-up: Establishing goals of care ? ?Patient Profile/HPI: 61 y.o. male  with past medical history of multiple gastric surgeries, esophageal perforation, hiatal hernia, sepsis r/t C Diff, Barrett's esophagus, one kidney,  admitted on 05/12/2021 with shortness of breath, weakness. He had just had a colonoscopy. He was admitted and treatment started for sepsis related to pneumonia (likely aspiration). His condition has worsened requiring eventual intubation on 4/2. On 4/5 he started having seizures. EEG indicates severe diffuse encephalopathy of nonspecific etiology. Brain MRI showed small watershed infarcts but CTA with no large vessel stenosis or occlusions. His recovery has been complicated by sudden increases in his HR and RR requiring increased sedation. Most recent chest xray on 4/6 unchanged bilateral pneumonia. Palliative medicine consulted for [place].   ? ?Subjective: ?Chart reviewed including labs, progress notes, imaging. Patient discussed with Dr. Loanne Drilling. ?Oxygenation and respiratory status continue to improve. Mental status remains unknown due to sedation requirements.  ?Met with son, Roxy Manns, and daughter Denton Ar (who was present by phone), spanish interpreter via ipad was also utilized as patient's mother is primary language Spanish.  ?Discussed patient's current status, possible trajectories.  ?Discussed goals of care, encouraged family to consider what patient's considerations of quality of life would be and what limits he would set.  Discussed what comfort focused care would like if determination was made that patient was not going to recover to what would be his preferred quality of life.  ?Patient's mother states that her goal would be to have patient recover to a point where he could transfer to a location closer to her.  ?Patient's children note they would want for more information regarding his mental status and possibility of recovery.  ? ?Review of Systems  ?Unable to perform ROS: Intubated  ? ? ?Physical Exam ?Vitals and nursing note reviewed.  ?Constitutional:   ?   Appearance: He is ill-appearing.  ?Pulmonary:  ?   Comments: intubated ?Neurological:  ?   Comments: sedated  ?         ? ?Vital Signs: BP 109/63 (BP Location: Left Arm)  Pulse (!) 112   Temp 97.7 ?F (36.5 ?C) (Axillary)   Resp (!) 33   Ht 5' 4" (1.626 m)   Wt 64.3 kg   SpO2 (!) 87%   BMI 24.33 kg/m?  ?SpO2: SpO2: (!) 87 % ?O2 Device: O2 Device: Ventilator ?O2 Flow Rate: O2 Flow Rate (L/min): 60 L/min ? ?Intake/output summary:  ?Intake/Output Summary (Last 24 hours) at 06/12/2021 1226 ?Last data filed at 06/12/2021 1200 ?Gross per 24 hour  ?Intake 4113.36 ml  ?Output 3525 ml  ?Net 588.36 ml  ? ? ?LBM: Last BM Date : 06/12/21 ?Baseline Weight: Weight: 55.3 kg ?Most recent weight: Weight: 64.3 kg ? ?     ?Palliative Assessment/Data: PPS: 10% ? ? ? ? ? ?Patient Active Problem List  ? Diagnosis Date Noted  ? Cerebral embolism with cerebral infarction 06/10/2021  ? Pressure injury of skin 06/10/2021  ? Acute metabolic encephalopathy 61/60/7371  ? Thrombocytopenia ([place]) 06/02/2021  ? Aspiration pneumonia ([place]) 06/01/2021  ? 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 Abnormal CT of the abdomen 06/01/2021  ? Lactic acidosis 06/01/2021  ? Hyponatremia 06/01/2021  ? Hyponatremia, hypokalemia and hypomagnesemia 06/01/2021  ? Respiratory distress 06/01/2021  ? Acute respiratory failure ([place]) 05/31/2021  ? Anxiety 05/31/2021  ? Hypomagnesemia 05/31/2021  ?  Protein-calorie malnutrition, severe 07/23/2020  ? Irregular cardiac rhythm 07/21/2020  ? Mild renal insufficiency 07/21/2020  ? Severe sepsis due to aspiration pneumonia 07/21/2020  ? Abdominal pain   ? Sepsis secondary to UTI ([place]) 06/05/2020  ? Hypokalemia 06/05/2020  ? Sinus tachycardia 06/05/2020  ? GERD with esophagitis 06/05/2020  ? History of alcohol abuse 06/05/2020  ? Essential hypertension 06/05/2020  ? Intractable nausea and vomiting 06/05/2020  ? Asthma in adult, mild intermittent, uncomplicated 08/27/9483  ? Sigmoid diverticulitis 06/05/2020  ? ? ?Palliative Care Assessment & Plan  ? ? ?Assessment/Recommendations/Plan ? ?Continue full scope- family notes that there may be a disagreement of goals of care  ?DNR ?PMT will continue to follow, will arrange new meeting if patient decompensates or begins to show lack of improvement  ? ? ?Code Status: ?DNR ? ?Prognosis: ? Unable to determine ? ?Discharge Planning: ?To Be Determined ? ?Care plan was discussed with patient's son, mother, daughter and care team.  ? ?Thank you for allowing the Palliative Medicine Team to assist in the care of this patient. ? ?Total time: 70 minutes ? ?Mariana Kaufman, AGNP-C ?Palliative Medicine ? ? ?Please contact Palliative Medicine Team phone at [phone] for questions and concerns.  ? ? ? ? ? ? ?

## 2021-06-12 NOTE — Plan of Care (Signed)
  Problem: Education: Goal: Knowledge of General Education information will improve Description: Including pain rating scale, medication(s)/side effects and non-pharmacologic comfort measures Outcome: Not Progressing   Problem: Health Behavior/Discharge Planning: Goal: Ability to manage health-related needs will improve Outcome: Not Progressing   Problem: Clinical Measurements: Goal: Ability to maintain clinical measurements within normal limits will improve Outcome: Not Progressing Goal: Will remain free from infection Outcome: Not Progressing Goal: Diagnostic test results will improve Outcome: Not Progressing Goal: Respiratory complications will improve Outcome: Not Progressing Goal: Cardiovascular complication will be avoided Outcome: Not Progressing   Problem: Activity: Goal: Risk for activity intolerance will decrease Outcome: Not Progressing   Problem: Nutrition: Goal: Adequate nutrition will be maintained Outcome: Not Progressing   Problem: Coping: Goal: Level of anxiety will decrease Outcome: Not Progressing   Problem: Elimination: Goal: Will not experience complications related to bowel motility Outcome: Not Progressing Goal: Will not experience complications related to urinary retention Outcome: Not Progressing   Problem: Pain Managment: Goal: General experience of comfort will improve Outcome: Not Progressing   Problem: Safety: Goal: Ability to remain free from injury will improve Outcome: Not Progressing   Problem: Skin Integrity: Goal: Risk for impaired skin integrity will decrease Outcome: Not Progressing   Problem: Activity: Goal: Ability to tolerate increased activity will improve Outcome: Not Progressing   Problem: Clinical Measurements: Goal: Ability to maintain a body temperature in the normal range will improve Outcome: Not Progressing   Problem: Respiratory: Goal: Ability to maintain adequate ventilation will improve Outcome: Not  Progressing Goal: Ability to maintain a clear airway will improve Outcome: Not Progressing   Problem: Safety: Goal: Non-violent Restraint(s) Outcome: Not Progressing   

## 2021-06-12 NOTE — Progress Notes (Signed)
Called [facility] where patient received his colonoscopy per family's request to find out about his biopsy results, and if they found malignancy.  The provider was not available, but the nurse was able to read me the report.  She states that patient did have poor prep, but there was biopsy of 2 polyps that ended up showing hyperplastic polyps on pathology report.  She discussed patient returning for another colonoscopy, but I did update nurse on his status, being in critical condition, intubated on ventilation.  She stated she would tell the provider who saw him, and I gave her the floor phone number in case the provider wanted to call back with any additional information that the nurse did not relay. ?

## 2021-06-13 DIAGNOSIS — R402434 Glasgow coma scale score 3-8, 24 hours or more after hospital admission: Secondary | ICD-10-CM

## 2021-06-13 DIAGNOSIS — J9600 Acute respiratory failure, unspecified whether with hypoxia or hypercapnia: Secondary | ICD-10-CM

## 2021-06-13 DIAGNOSIS — Z7189 Other specified counseling: Secondary | ICD-10-CM | POA: Diagnosis not present

## 2021-06-13 DIAGNOSIS — G9341 Metabolic encephalopathy: Secondary | ICD-10-CM | POA: Diagnosis not present

## 2021-06-13 DIAGNOSIS — Z66 Do not resuscitate: Secondary | ICD-10-CM | POA: Diagnosis not present

## 2021-06-13 DIAGNOSIS — J9601 Acute respiratory failure with hypoxia: Secondary | ICD-10-CM | POA: Diagnosis not present

## 2021-06-13 DIAGNOSIS — A419 Sepsis, unspecified organism: Secondary | ICD-10-CM | POA: Diagnosis not present

## 2021-06-13 LAB — GLUCOSE, CAPILLARY
Glucose-Capillary: 117 mg/dL — ABNORMAL HIGH (ref 70–99)
Glucose-Capillary: 126 mg/dL — ABNORMAL HIGH (ref 70–99)
Glucose-Capillary: 128 mg/dL — ABNORMAL HIGH (ref 70–99)
Glucose-Capillary: 132 mg/dL — ABNORMAL HIGH (ref 70–99)

## 2021-06-13 LAB — BASIC METABOLIC PANEL
Anion gap: 7 (ref 5–15)
BUN: 30 mg/dL — ABNORMAL HIGH (ref 6–20)
CO2: 33 mmol/L — ABNORMAL HIGH (ref 22–32)
Calcium: 9.5 mg/dL (ref 8.9–10.3)
Chloride: 99 mmol/L (ref 98–111)
Creatinine, Ser: 0.85 mg/dL (ref 0.61–1.24)
GFR, Estimated: >60 mL/min (ref >60)
Glucose, Bld: 140 mg/dL — ABNORMAL HIGH (ref 70–99)
Potassium: 4.1 mmol/L (ref 3.5–5.1)
Sodium: 139 mmol/L (ref 135–145)

## 2021-06-13 LAB — CULTURE, RESPIRATORY W GRAM STAIN

## 2021-06-13 LAB — CBC
HCT: 23.8 % — ABNORMAL LOW (ref 39.0–52.0)
Hemoglobin: 7.4 g/dL — ABNORMAL LOW (ref 13.0–17.0)
MCH: 28.4 pg (ref 26.0–34.0)
MCHC: 31.1 g/dL (ref 30.0–36.0)
MCV: 91.2 fL (ref 80.0–100.0)
Platelets: 745 10*3/uL — ABNORMAL HIGH (ref 150–400)
RBC: 2.61 MIL/uL — ABNORMAL LOW (ref 4.22–5.81)
RDW: 19 % — ABNORMAL HIGH (ref 11.5–15.5)
WBC: 27.6 10*3/uL — ABNORMAL HIGH (ref 4.0–10.5)
nRBC: 0.1 % (ref 0.0–0.2)

## 2021-06-13 LAB — MAGNESIUM: Magnesium: 2.1 mg/dL (ref 1.7–2.4)

## 2021-06-13 LAB — TRIGLYCERIDES: Triglycerides: 251 mg/dL — ABNORMAL HIGH (ref <150)

## 2021-06-13 MED ORDER — GLYCOPYRROLATE 0.2 MG/ML IJ SOLN: 0.2000 mg | INTRAMUSCULAR | Status: DC | PRN

## 2021-06-13 MED ORDER — DIPHENHYDRAMINE HCL 50 MG/ML IJ SOLN: 25.0000 mg | INTRAMUSCULAR | Status: DC | PRN

## 2021-06-13 MED ORDER — FENTANYL BOLUS VIA INFUSION
100.0000 ug | INTRAVENOUS | Status: DC | PRN
  Administered 2021-06-13: 100 ug via INTRAVENOUS
  Filled 2021-06-13: qty 100

## 2021-06-13 MED ORDER — GLYCOPYRROLATE 0.2 MG/ML IJ SOLN
0.2000 mg | INTRAMUSCULAR | Status: DC | PRN
  Administered 2021-06-13: 0.2 mg via INTRAVENOUS
  Filled 2021-06-13: qty 1

## 2021-06-13 MED ORDER — GLYCOPYRROLATE 1 MG PO TABS: 1.0000 mg | ORAL_TABLET | ORAL | Status: DC | PRN

## 2021-06-13 MED ORDER — POLYVINYL ALCOHOL 1.4 % OP SOLN
1.0000 [drp] | Freq: Four times a day (QID) | OPHTHALMIC | Status: DC | PRN
  Filled 2021-06-13: qty 15

## 2021-07-01 NOTE — Progress Notes (Signed)
This chaplain confirmed Father Minerva Areola will visit with the family at the Pt. bedside this afternoon. ? ?Chaplain Stephanie Acre ?[phone] ?

## 2021-07-01 NOTE — Death Summary Note (Signed)
?DEATH SUMMARY  ? ?Patient Details  ?Name: Colin Hamilton ?MRN: [id number] ?DOB: [birth year] ? ?Admission/Discharge Information  ? ?Admit Date:  06/14/2021  ?Date of Death: Date of Death: 28-Jun-2021  ?Time of Death: Time of Death: 26  ?Length of Stay: 13  ?Referring Physician: Pcp, No  ? ?Reason(s) for Hospitalization  ?Acute hypoxic respiratory failure  ? ?Diagnoses  ?Preliminary cause of death:  ?Secondary Diagnoses (including complications and co-morbidities):  ?Principal Problem: ?  Severe sepsis due to aspiration pneumonia ?Active Problems: ?  Hypokalemia ?  Sinus tachycardia ?  History of alcohol abuse ?  Essential hypertension ?  Asthma in adult, mild intermittent, uncomplicated ?  Abdominal pain ?  Protein-calorie malnutrition, severe ?  Anxiety ?  Hypomagnesemia ?  Aspiration pneumonia ([place]) ?  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  Abnormal CT of the abdomen ?  Lactic acidosis ?  Hyponatremia ?  Hyponatremia, hypokalemia and hypomagnesemia ?  Respiratory distress ?  Acute metabolic encephalopathy ?  Thrombocytopenia ([place]) ?  Cerebral embolism with cerebral infarction ?  Pressure injury of skin ? ? ?Brief Hospital Course (including significant findings, care, treatment, and services provided and events leading to death)  ?Colin Hamilton is a 61 y.o. year old male who was admitted on 06-15-22 for shortness of breath and abdominal pain after an outpatient colonoscopy. CT chest/abdomen/pelvis -nodular infiltrates especially in left lung concerning for aspiration pneumonia, large hiatal hernia versus gastric polyp, sigmoid diverticulosis, enhancement along right lateral bladder wall. Treated for sepsis with broad spectrum antibiotics, was given a fluid bolus, and started on bipap for increased work of breathing. PCCM was consulted on 4/3 due to worsening respiratory distress and intubation requiring pressors. He started to have seizures on 4/5 and was transferred to [facility] for cEEG.  ? ?While in the ICU he was  maintained on sedation and pressors, mechanical ventilation was titrated. Ng tube was placed for feeds. MRI brain performed showed small bilateral cortical and a few subcortical infarcts in watershed like distribution. CT angio of the brain showed no emergent arterial finding. There was atherosclerosis without major vessel flow limiting stenosis or detected embolic source. Acute infarcts were underestimated relative to prior brain MRI. No hemorrhage or evidence of progression.Neurology was consulted for seizures, as he had episodes concerning for seizure such as gaze deviation and focal twitching of his toe. He was given Ativan with resolution of the movements and was maintained on Keppra and Vipat.  and had cEEG which ultimately did know show seizure activity but did show diffuse encephalopathy with nonspecific etiology. His blood pressure was labile throughout his hospitalization, with vasopressors switched and titrated to support a MAP of greater than 65. Palliative was consulted who followed with family who decided to change his code status from full code to DNR and ultimately to comfort care. The decision to compassionately extubate was made on 29-Jun-2022.  ? ?Pertinent Labs and Studies  ?Significant Diagnostic Studies ?CT ANGIO HEAD NECK W WO CM ? ?Result Date: 06/06/2021 ?CLINICAL DATA:  Stroke follow-up EXAM: CT ANGIOGRAPHY HEAD AND NECK TECHNIQUE: Multidetector CT imaging of the head and neck was performed using the standard protocol during bolus administration of intravenous contrast. Multiplanar CT image reconstructions and MIPs were obtained to evaluate the vascular anatomy. Carotid stenosis measurements (when applicable) are obtained utilizing NASCET criteria, using the distal internal carotid diameter as the denominator. RADIATION DOSE REDUCTION: This exam was performed according to the departmental dose-optimization program which includes automated exposure control, adjustment of the  mA and/or kV according to  patient size and/or use of iterative reconstruction technique. CONTRAST:  12mL OMNIPAQUE IOHEXOL 350 MG/ML SOLN COMPARISON:  Brain MRI from yesterday FINDINGS: CT HEAD FINDINGS Brain: Known acute infarcts are subtle compared to prior brain MRI. No gross progression and no hemorrhage. Vascular: See below Skull: Negative Sinuses: Negative Orbits: Negative Review of the MIP images confirms the above findings CTA NECK FINDINGS Aortic arch: Atheromatous plaque and elongation. No acute finding or dilatation. Right carotid system: Mixed density plaque at the bifurcation and proximal ICA without stenosis or ulceration. Left carotid system: Atheromatous plaque centered at the bifurcation, mixed density. No stenosis or ulceration. Vertebral arteries: No proximal subclavian stenosis. Motion artifact affects both proximal vertebral arteries with no suspected stenosis and no beading or dissection flap seen. Skeleton: No acute or aggressive finding Other neck: Calcification and scar-like appearance anterior to the distorted trachea at the thoracic inlet, suspect prior tracheostomy. Upper chest: Extensive ground-glass and consolidative opacity in the upper lungs with small fissural pleural fluid on the left. Review of the MIP images confirms the above findings CTA HEAD FINDINGS Anterior circulation: No significant stenosis, proximal occlusion, aneurysm, or vascular malformation. Mild atheromatous calcification of the carotid siphon Posterior circulation: The vertebral and basilar arteries are smoothly contoured and diffusely patent. No branch occlusion, beading, or flow limiting stenosis. Mild distal PCA atheromatous irregularity is suspected. Venous sinuses: Diffusely patent Anatomic variants: Aplastic left A1 segment Review of the MIP images confirms the above findings IMPRESSION: No emergent arterial finding. There is atherosclerosis without major vessel flow limiting stenosis or detected embolic source. Acute infarcts are  underestimated relative to prior brain MRI. No hemorrhage or evidence of progression. Electronically Signed   By: Jorje Guild M.D.   On: 06/06/2021 10:15  ? ?DG Abd 1 View ? ?Result Date: 06/10/2021 ?CLINICAL DATA:  Hypoxemia.  Sepsis. EXAM: ABDOMEN - 1 VIEW COMPARISON:  June 06, 2020. FINDINGS: The bowel gas pattern is normal. No radio-opaque calculi or other significant radiographic abnormality are seen. IMPRESSION: No abnormal bowel dilatation is noted. Electronically Signed   By: Marijo Conception M.D.   On: 06/10/2021 10:03  ? ?MR BRAIN WO CONTRAST ? ?Result Date: 06/01/2021 ?CLINICAL DATA:  Neuro deficit, acute, stroke suspected. Generalized seizure. EXAM: MRI HEAD WITHOUT CONTRAST TECHNIQUE: Multiplanar, multiecho pulse sequences of the brain and surrounding structures were obtained without intravenous contrast. COMPARISON:  None. FINDINGS: Brain: Diffusion imaging shows a total of 5 or 6 1 cm or smaller infarctions affecting the cortex in the right frontal, parietal and occipital lobes. Similar but less numerous infarctions are noted in the right parietal region. The most likely explanation is that of watershed infarctions. Micro embolic infarctions are possible. There is an old small cortical infarction at the right frontoparietal vertex which does not show acute restricted diffusion. No large vessel territory infarction. The brainstem and cerebellum are normal. No evidence of pre-existing small-vessel disease. No hemorrhage, hydrocephalus or extra-axial collection. Vascular: Major vessels at the base of the brain show flow. Skull and upper cervical spine: Negative Sinuses/Orbits: Clear/normal Other: None IMPRESSION: Several acute subcentimeter cortical infarctions affecting both hemispheres as outlined above, most consistent with watershed infarctions. Micro embolic infarctions are possible but seemingly less likely. No large confluent infarction, swelling or hemorrhage. Old small cortical infarction at the  right frontoparietal vertex. Electronically Signed   By: Nelson Chimes M.D.   On: 06/30/2021 13:06  ? ?CT CHEST ABDOMEN PELVIS W CONTRAST ? ?Result Date: 05/17/2021 ?CLINICAL DATA:  Sepsis. Status post co

## 2021-07-01 NOTE — Progress Notes (Signed)
Family decided to move forward with comfort care and compassionate extubation. Kaelyn, RT extubated patient at 1300. Patient expired at 29 with Leonard Downing, NP and family at bedside. All belongings returned to family members. Patient will be transported to morgue once family is ready.  ?

## 2021-07-01 NOTE — Progress Notes (Signed)
Nutrition Brief Note ° °Chart reviewed. °Pt now transitioning to comfort care.  °No further nutrition interventions planned at this time.  °Please re-consult as needed. ° ° °Kate Gunhild Bautch, MS, RD, LDN °Inpatient Clinical Dietitian °Please see AMiON for contact information. ° ° °

## 2021-07-01 NOTE — Progress Notes (Signed)
? ?NAME:  Colin Hamilton, MRN:  [id number], DOB:  07/27/1960, LOS: 6 ?ADMISSION DATE:  05/20/2021, CONSULTATION DATE:  06/12/2021 ?REFERRING MD:  Cyndia Skeeters, TRH, CHIEF COMPLAINT:  resp distress     ? ?History of Present Illness:  ? ?Colin Hamilton is a 27M with barrett's esophagus, esophagectomy with gastric pull through, atrial fibrillation, HTN, asthma, and alcohol abuse who was admitted for shortness of breath and abdominal pain after colonoscopy. CT chest/abdomen/pelvis -nodular infiltrates especially in left lung concerning for aspiration pneumonia, large hiatal hernia versus gastric polyp, sigmoid diverticulosis, enhancement along right lateral bladder wall. Treated for sepsis and started on bipap. PCCM was consulted on 4/3 due to worsening respiratory distress and intubation requiring pressors. He started to have seizures on 4/5 and was transferred to [facility] for cEEG.  ? ?Pertinent  Medical History  ? ?Barrett's esophagus ?Esophagectomy with gastric pull-through ?Solitary right kidney , CKD stage III yea ?Alcohol use ?Afib ?Asthma  ?HTN ? ?Significant Hospital Events: ?Including procedures, antibiotic start and stop dates in addition to other pertinent events   ?3/30 CT chest/abdomen/pelvis -nodular infiltrates especially in left lung, large hiatal hernia versus gastric polyp, sigmoid diverticulosis, enhancement along right lateral bladder wall, started on vanc, flagyl, cefepime ?3/31 Admitted to [place] ?4/1 abx changed to azithro, ceftriaxone, flagyl  ?4/2 ctx changed to cefepime ?4/3 PCCM consult, worsening resp distress> intubated, pressors  ?4/4 precedex added, remains on 50%/ 8 peep ?4/5 seizure; CT head/ ceribell/ MRI ordered  ? ?3/30 SARS/ flu > neg ?3/31 MRSA PCR > neg ?3/30 UC > < 10k colonies, insignificant growth ?3/30 Bcx2 > ngtd ?3/31 urine strep> neg ?3/31 urine Legionella >  ?4/2 trach asp >candida albicans  ?4/10 Meropenem started  ?4/11 arterial line placement  ? ?Interim History / Subjective:  ? ?No acute  events overnight. Able to be weaned on vent currently 50% FiO2, peep 8, RR 30, TV 470, peak pressure 32, plat 30. Continues to be sedated and on pressors: Propofol 18, Levo 33, Fentanyl 40 ? ?Palliative meeting yesterday without any changes to care  ? ?Labs: BUN still elevated at 30, WBC down from 31 to 27, Hgb 7.4, platelets elevated 745  ? ?Objective   ?Blood pressure (!) 96/54, pulse 99, temperature 98.1 ?F (36.7 ?C), temperature source Esophageal, resp. rate (!) 30, height 5\' 4"  (1.626 m), weight 63.3 kg, SpO2 96 %. ?   ?Vent Mode: PRVC ?FiO2 (%):  [40 %-60 %] 50 % ?Set Rate:  [30 bmp] 30 bmp ?Vt Set:  [470 mL] 470 mL ?PEEP:  [8 cmH20] 8 cmH20 ?Plateau Pressure:  [16 cmH20-33 cmH20] 26 cmH20  ? ?Intake/Output Summary (Last 24 hours) at 06/15/21 0821 ?Last data filed at 06-15-2021 0600 ?Gross per 24 hour  ?Intake 3370.94 ml  ?Output 2980 ml  ?Net 390.94 ml  ? ?Filed Weights  ? 06/11/21 0500 06/12/21 0436 06-15-2021 0209  ?Weight: 63.2 kg 64.3 kg 63.3 kg  ? ? ?Examination: ?General: critically ill appearing, on ventilation  ?HENT: L scleral implant with L pupil > R ?Lungs: mechanically ventilated lung sounds ?Cardiovascular: tachycardic without murmurs ?Abdomen: soft, non distended    ?Extremities: warm. L hand edema  ?Neuro: Sedated and non responsive  ? ?Assessment & Plan:  ? ?Acute hypoxic respiratory failure ?Septic shock ?Aspiration pneumonia/ pneumonitis  ?Likely underlying COPD versus history of asthma ?Former smoker ?Remains on full vent support, was able to be weaned yesterday currently at 50% FiO2, peep 8, RR 30, TV 470, peak pressure 32, plat 30.  ?  Continues to be sedated and on pressors: Propofol 18, Levo 33, Fentanyl 40 ?Goal O2 sat of 88-95%  ?Received Lasix 20mg  IV twice 2.8L urine output yesterday. Still net positive ~9Lsince admission  ?- Continue Meropenum  ?- Continue scheduled nebs, Brovana, Pulmicort  ?- Continue lasix  ?- wean for MAP 65 ?  ?Status epilepticus, frequent seizures despite 2  AEDs ?Acute metabolic encephalopathy ?Watershed infarcts  ?- Neurology consulted, appreciate recommendations  ?- Continue Keppra and Vimpat  ?  ?Abdominal pain ?Large hiatal hernia ?Sigmoid diverticulosis with concern for sigmoid mass vs diverticular stricture ?Also with developing adhesions to bladder. Colonoscopy with biopsy performed in [facility] ?NG tube in place. EGD performed showing previous surgical anastomosis, Gastritis, A few duodenal polyps. ?- Surgery and GI signed off, will need outpatient follow-up ?- consider de escalating PPI  ?  ?CKD III ?Solitary right kidney  ?Cr stable at 0.85  ?- strict Is and Os ?- avoid nephrotoxic drugs ?  ?HLD  ?Triglycerides 251, HDL <10, VLDL 50 ?- Continue statin ?- Can still continue sedation  ?  ?Normocytic anemia ?Hgb stable at 7.4.  No acute signs of bleeding or need for transfusion  ?- continue to monitor  ?  ?Goals of care  ?Palliative following and is having ongoing discussions with family. Patient DNR now and still with full medical interventions. Per note would be amenable to tracheostomy if showing improvement but not to permanently artificially prolong life   ?- chaplain consulted to provide additional support for family  ?- palliative in person family meeting at 11am  ?  ? ?Best Practice (right click and "Reselect all SmartList Selections" daily)  ? ?Diet/type: NPO ?DVT prophylaxis: LMWH ?GI prophylaxis: PPI ?Lines: Arterial Line ?Foley:  Yes, and it is still needed ?Code Status:  DNR ? ? ?Labs   ?CBC: ?Recent Labs  ?Lab 06/09/21 ?0414 06/10/21 ?0148 06/10/21 ?[id number] 06/10/21 ?1615 06/11/21 ?0414 06/11/21 ?1141 06/12/21 ?0422 06/12/21 ?0431 06/12/21 ?1148 06/12/21 ?1512 06-24-2021 ?0423  ?WBC 25.7*  --  27.7*  --  36.5*  --  31.2*  --   --   --  27.6*  ?HGB 7.7*   < > 7.7*   < > 7.7*   < > 7.3* 8.5* 8.2* 8.5* 7.4*  ?HCT 25.0*   < > 24.8*   < > 25.8*   < > 24.9* 25.0* 24.0* 25.0* 23.8*  ?MCV 90.9  --  91.2  --  95.6  --  94.0  --   --   --  91.2  ?PLT 257  --   346  --  445*  --  555*  --   --   --  745*  ? < > = values in this interval not displayed.  ? ? ?Basic Metabolic Panel: ?Recent Labs  ?Lab 06/07/21 ?0451 06/08/21 ?0313 06/09/21 ?0414 06/10/21 ?[id number] 06/10/21 ?1249 06/10/21 ?1615 06/11/21 ?0414 06/11/21 ?1141 06/12/21 ?0422 06/12/21 ?0431 06/12/21 ?1148 06/12/21 ?1512 06-24-2021 ?0423  ?NA 139 139   < > 141 140   < > 139   < > 138 138 139 139 139  ?K 3.1* 4.1   < > 5.2* 4.6   < > 4.6   < > 4.3 4.5 4.2 4.3 4.1  ?CL 102 104   < > 106 103  --  102  --  101  --   --   --  99  ?CO2 29 28   < > 30 31  --  33*  --  32  --   --   --  33*  ?GLUCOSE 110* 136*   < > 117* 176*  --  149*  --  157*  --   --   --  140*  ?BUN 16 20   < > 26* 27*  --  29*  --  28*  --   --   --  30*  ?CREATININE 1.55* 1.14   < > 0.87 1.03  --  0.95  --  0.90  --   --   --  0.85  ?CALCIUM 8.2* 8.3*   < > 8.4* 8.3*  --  8.6*  --  9.1  --   --   --  9.5  ?MG 1.5* 2.4  --   --   --   --  2.2  --  2.1  --   --   --  2.1  ?PHOS  --  3.5  --   --   --   --   --   --   --   --   --   --   --   ? < > = values in this interval not displayed.  ? ?GFR: ?Estimated Creatinine Clearance: 78.4 mL/min (by C-G formula based on SCr of 0.85 mg/dL). ?Recent Labs  ?Lab 06/10/21 ?[id number] 06/11/21 ?0414 06/11/21 ?1003 06/11/21 ?1531 06/12/21 ?0422 05-Jul-2021 ?0423  ?WBC 27.7* 36.5*  --   --  31.2* 27.6*  ?LATICACIDVEN  --   --  1.0 1.3  --   --   ? ? ?Liver Function Tests: ?No results for input(s): AST, ALT, ALKPHOS, BILITOT, PROT, ALBUMIN in the last 168 hours. ?No results for input(s): LIPASE, AMYLASE in the last 168 hours. ?No results for input(s): AMMONIA in the last 168 hours. ? ?ABG ?   ?Component Value Date/Time  ? PHART 7.389 06/12/2021 1512  ? PCO2ART 60.4 (H) 06/12/2021 1512  ? PO2ART 78 (L) 06/12/2021 1512  ? HCO3 36.6 (H) 06/12/2021 1512  ? TCO2 38 (H) 06/12/2021 1512  ? ACIDBASEDEF 2.5 (H) 06/15/2021 0933  ? O2SAT 95 06/12/2021 1512  ?  ? ?Coagulation Profile: ?No results for input(s): INR, PROTIME in the last 168  hours. ? ?Cardiac Enzymes: ?No results for input(s): CKTOTAL, CKMB, CKMBINDEX, TROPONINI in the last 168 hours. ? ?HbA1C: ?Hgb A1c MFr Bld  ?Date/Time Value Ref Range Status  ?06/07/2021 04:51 AM 5.2 4.8 - 5

## 2021-07-01 NOTE — Progress Notes (Signed)
RT compassionately extubated patient per MD order and family wishes with NP, RN and family at bedside.  ?

## 2021-07-01 NOTE — Progress Notes (Addendum)
? ?                                                                                                                                                     ?                                                   ?Daily Progress Note  ? ?Patient Name: Colin Hamilton       Date: 2021/07/09 ?DOB: [birth year]  Age: 61 y.o. MRN#: [id number] ?Attending Physician: Margaretha Seeds, MD ?Primary Care Physician: Pcp, No ?Admit Date: 05/01/2021 ? ?Reason for Consultation/Follow-up: Establishing goals of care ? ?Patient Profile/HPI: 61 y.o. male  with past medical history of multiple gastric surgeries, esophageal perforation, hiatal hernia, sepsis r/t C Diff, Barrett's esophagus, one kidney,  admitted on 05/09/2021 with shortness of breath, weakness. He had just had a colonoscopy. He was admitted and treatment started for sepsis related to pneumonia (likely aspiration). His condition has worsened requiring eventual intubation on 4/2. On 4/5 he started having seizures. EEG indicates severe diffuse encephalopathy of nonspecific etiology. Brain MRI showed small watershed infarcts but CTA with no large vessel stenosis or occlusions. His recovery has been complicated by sudden increases in his HR and RR requiring increased sedation. Most recent chest xray on 4/6 unchanged bilateral pneumonia. Palliative medicine consulted for [place].   ? ?Subjective: ?Chart reviewed including labs, progress notes, imaging. He continues to do poorly. He continues to require IV pressor support and ventilation.  ?Spoke with [name] via phone this morning. He shares that family has discussed his state and possible trajectories. He notes that in the past when [name] sister was in a similar state [name] expressed that he would not want to be prolonged on artificial life support. Given [name]'s poor prognosis for recovery to an independent state they have decided to transition him to full comfort measures, extubate and allow for natural dying with symptom management.   ?I met again in person at the bedside with [name] and [name] mother, using a Patent attorney. Confirmed family's desire for extubation and full comfort. They are all in agreement. Explained the process of extubation and symptom management. Discussed he may die very quickly after extubation- but he may also continue to breathe on his own for hours-days- but that our goal will be that he dies comfortably.  ?They had questions regarding making arrangements for after his death- they are in contact with a funeral home in [place], and are looking for one here to connect with.  ?They would like patient to receive Annointment of the sick before dying. Spiritual care is contacting Father Randall Hiss.  ? ?Review of Systems  ?Unable to  perform ROS: Intubated  ? ? ?Physical Exam ?Vitals and nursing note reviewed.  ?Constitutional:   ?   Appearance: He is ill-appearing.  ?Pulmonary:  ?   Comments: intubated ?Neurological:  ?   Comments: sedated  ?         ? ?Vital Signs: BP 106/66   Pulse 100   Temp (!) 97.2 ?F (36.2 ?C) (Esophageal)   Resp (!) 30   Ht '5\' 4"'  (1.626 m)   Wt 63.3 kg   SpO2 95%   BMI 23.95 kg/m?  ?SpO2: SpO2: 95 % ?O2 Device: O2 Device: Ventilator ?O2 Flow Rate: O2 Flow Rate (L/min): 60 L/min ? ?Intake/output summary:  ?Intake/Output Summary (Last 24 hours) at 2021/07/08 1044 ?Last data filed at 07-08-21 1000 ?Gross per 24 hour  ?Intake 3029.41 ml  ?Output 3110 ml  ?Net -80.59 ml  ? ? ?LBM: Last BM Date : 2021-07-08 ?Baseline Weight: Weight: 55.3 kg ?Most recent weight: Weight: 63.3 kg ? ?     ?Palliative Assessment/Data: PPS: 10% ? ? ? ? ? ?Patient Active Problem List  ? Diagnosis Date Noted  ?? Cerebral embolism with cerebral infarction 06/10/2021  ?? Pressure injury of skin 06/10/2021  ?? Acute metabolic encephalopathy 13/24/4010  ?? Thrombocytopenia ([place]) 06/02/2021  ?? Aspiration pneumonia ([place]) 06/01/2021  ?? 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 Abnormal CT of the abdomen  06/01/2021  ?? Lactic acidosis 06/01/2021  ?? Hyponatremia 06/01/2021  ?? Hyponatremia, hypokalemia and hypomagnesemia 06/01/2021  ?? Respiratory distress 06/01/2021  ?? Acute respiratory failure ([place]) 05/31/2021  ?? Anxiety 05/31/2021  ?? Hypomagnesemia 05/31/2021  ?? Protein-calorie malnutrition, severe 07/23/2020  ?? Irregular cardiac rhythm 07/21/2020  ?? Mild renal insufficiency 07/21/2020  ?? Severe sepsis due to aspiration pneumonia 07/21/2020  ?? Abdominal pain   ?? Sepsis secondary to UTI ([place]) 06/05/2020  ?? Hypokalemia 06/05/2020  ?? Sinus tachycardia 06/05/2020  ?? GERD with esophagitis 06/05/2020  ?? History of alcohol abuse 06/05/2020  ?? Essential hypertension 06/05/2020  ?? Intractable nausea and vomiting 06/05/2020  ?? Asthma in adult, mild intermittent, uncomplicated 27/25/3664  ?? Sigmoid diverticulitis 06/05/2020  ? ? ?Palliative Care Assessment & Plan  ? ? ?Assessment/Recommendations/Plan ? ?Extubate to full comfort measures only ?Continue propofol and fentanyl as this is currently providing comfort- per nursing he has some response to stimuli with increase BP, RR- can increase propofol to ensure comfort at extubation, also utilize fentanyl boluses ?Extubate to room air when family is ready ? ? ?Addendum- per spiritual care patient was annointed by Father Randall Hiss on Monday- I will let family know- however, Father Randall Hiss does plan to come this afternoon and provide supportive presence.  ? ?Addendum- I returned to bedside- patient's family expressed they were ready to proceed with extubation. I remained at bedside and assisted with medication and symptom management as patient was extubated to room air. He was tachypneic at first but comfort was achieved with 2 boluses of fentanyl and 1 dose 73m IV lorazepam. He proceeded through expected natural dying process with comfort and with his son at the bedside.  ? ?Code Status: ?DNR ? ?Prognosis: ? Hours - Days ? ?Discharge Planning: ?Anticipated Hospital  Death ? ?Care plan was discussed with patient's son, mother,  and care team.  ? ?Thank you for allowing the Palliative Medicine Team to assist in the care of this patient. ? ?Total time: 150 minutes ? ?KMariana Kaufman AGNP-C ?Palliative Medicine ? ? ?Please contact Palliative Medicine Team phone at [phone]  questions and concerns.  ? ? ? ? ? ? ?

## 2021-07-01 DEATH — deceased

## 2023-03-13 ENCOUNTER — Other Ambulatory Visit: Payer: Self-pay
